# Patient Record
Sex: Female | Born: 1945 | Race: Black or African American | Hispanic: No | Smoking: Former smoker
Health system: Southern US, Community
[De-identification: ages and names within clinical notes are randomized; demographics above are authoritative.]

## PROBLEM LIST (undated history)

## (undated) DIAGNOSIS — C3490 Malignant neoplasm of unspecified part of unspecified bronchus or lung: Secondary | ICD-10-CM

## (undated) DIAGNOSIS — E785 Hyperlipidemia, unspecified: Secondary | ICD-10-CM

## (undated) DIAGNOSIS — K219 Gastro-esophageal reflux disease without esophagitis: Secondary | ICD-10-CM

## (undated) DIAGNOSIS — IMO0001 Reserved for inherently not codable concepts without codable children: Secondary | ICD-10-CM

## (undated) DIAGNOSIS — I10 Essential (primary) hypertension: Secondary | ICD-10-CM

## (undated) HISTORY — DX: Gastro-esophageal reflux disease without esophagitis: K21.9

## (undated) HISTORY — DX: Malignant neoplasm of unspecified part of unspecified bronchus or lung: C34.90

## (undated) HISTORY — DX: Essential (primary) hypertension: I10

## (undated) HISTORY — DX: Reserved for inherently not codable concepts without codable children: IMO0001

---

## 1998-07-15 ENCOUNTER — Ambulatory Visit (HOSPITAL_COMMUNITY): Admission: RE | Admit: 1998-07-15 | Discharge: 1998-07-15 | Payer: Self-pay | Admitting: Family Medicine

## 1999-01-07 ENCOUNTER — Ambulatory Visit (HOSPITAL_COMMUNITY): Admission: RE | Admit: 1999-01-07 | Discharge: 1999-01-07 | Payer: Self-pay | Admitting: *Deleted

## 1999-01-07 ENCOUNTER — Encounter: Payer: Self-pay | Admitting: Family Medicine

## 1999-02-08 ENCOUNTER — Other Ambulatory Visit: Admission: RE | Admit: 1999-02-08 | Discharge: 1999-02-08 | Payer: Self-pay | Admitting: Family Medicine

## 1999-10-18 ENCOUNTER — Ambulatory Visit (HOSPITAL_COMMUNITY): Admission: RE | Admit: 1999-10-18 | Discharge: 1999-10-18 | Payer: Self-pay | Admitting: General Surgery

## 1999-10-20 ENCOUNTER — Encounter (INDEPENDENT_AMBULATORY_CARE_PROVIDER_SITE_OTHER): Payer: Self-pay | Admitting: *Deleted

## 1999-10-20 ENCOUNTER — Ambulatory Visit (HOSPITAL_COMMUNITY): Admission: RE | Admit: 1999-10-20 | Discharge: 1999-10-20 | Payer: Self-pay | Admitting: *Deleted

## 2000-01-10 ENCOUNTER — Encounter: Payer: Self-pay | Admitting: Family Medicine

## 2000-01-10 ENCOUNTER — Ambulatory Visit (HOSPITAL_COMMUNITY): Admission: RE | Admit: 2000-01-10 | Discharge: 2000-01-10 | Payer: Self-pay | Admitting: Family Medicine

## 2000-06-11 ENCOUNTER — Encounter: Admission: RE | Admit: 2000-06-11 | Discharge: 2000-06-11 | Payer: Self-pay | Admitting: Family Medicine

## 2000-06-11 ENCOUNTER — Encounter: Payer: Self-pay | Admitting: Family Medicine

## 2000-08-22 ENCOUNTER — Other Ambulatory Visit: Admission: RE | Admit: 2000-08-22 | Discharge: 2000-08-22 | Payer: Self-pay | Admitting: Family Medicine

## 2002-06-05 ENCOUNTER — Encounter: Payer: Self-pay | Admitting: Family Medicine

## 2002-06-05 ENCOUNTER — Encounter: Admission: RE | Admit: 2002-06-05 | Discharge: 2002-06-05 | Payer: Self-pay | Admitting: Family Medicine

## 2003-06-01 ENCOUNTER — Encounter (INDEPENDENT_AMBULATORY_CARE_PROVIDER_SITE_OTHER): Payer: Self-pay | Admitting: Specialist

## 2003-06-01 ENCOUNTER — Ambulatory Visit (HOSPITAL_BASED_OUTPATIENT_CLINIC_OR_DEPARTMENT_OTHER): Admission: RE | Admit: 2003-06-01 | Discharge: 2003-06-01 | Payer: Self-pay | Admitting: General Surgery

## 2003-06-01 ENCOUNTER — Ambulatory Visit (HOSPITAL_COMMUNITY): Admission: RE | Admit: 2003-06-01 | Discharge: 2003-06-01 | Payer: Self-pay | Admitting: General Surgery

## 2003-06-22 ENCOUNTER — Emergency Department (HOSPITAL_COMMUNITY): Admission: EM | Admit: 2003-06-22 | Discharge: 2003-06-22 | Payer: Self-pay | Admitting: Emergency Medicine

## 2003-08-23 ENCOUNTER — Emergency Department (HOSPITAL_COMMUNITY): Admission: EM | Admit: 2003-08-23 | Discharge: 2003-08-23 | Payer: Self-pay | Admitting: Emergency Medicine

## 2004-10-08 IMAGING — CR DG ANKLE COMPLETE 3+V*L*
2 series · 2 of 2 positions shown · non-contrast
Comparison: none

CLINICAL DATA: Patient has left ankle pain.
 LEFT ANKLE, THREE VIEWS
 No evidence of fracture.  
 IMPRESSION 
 No fracture.

[view not recorded (1 of 2)]
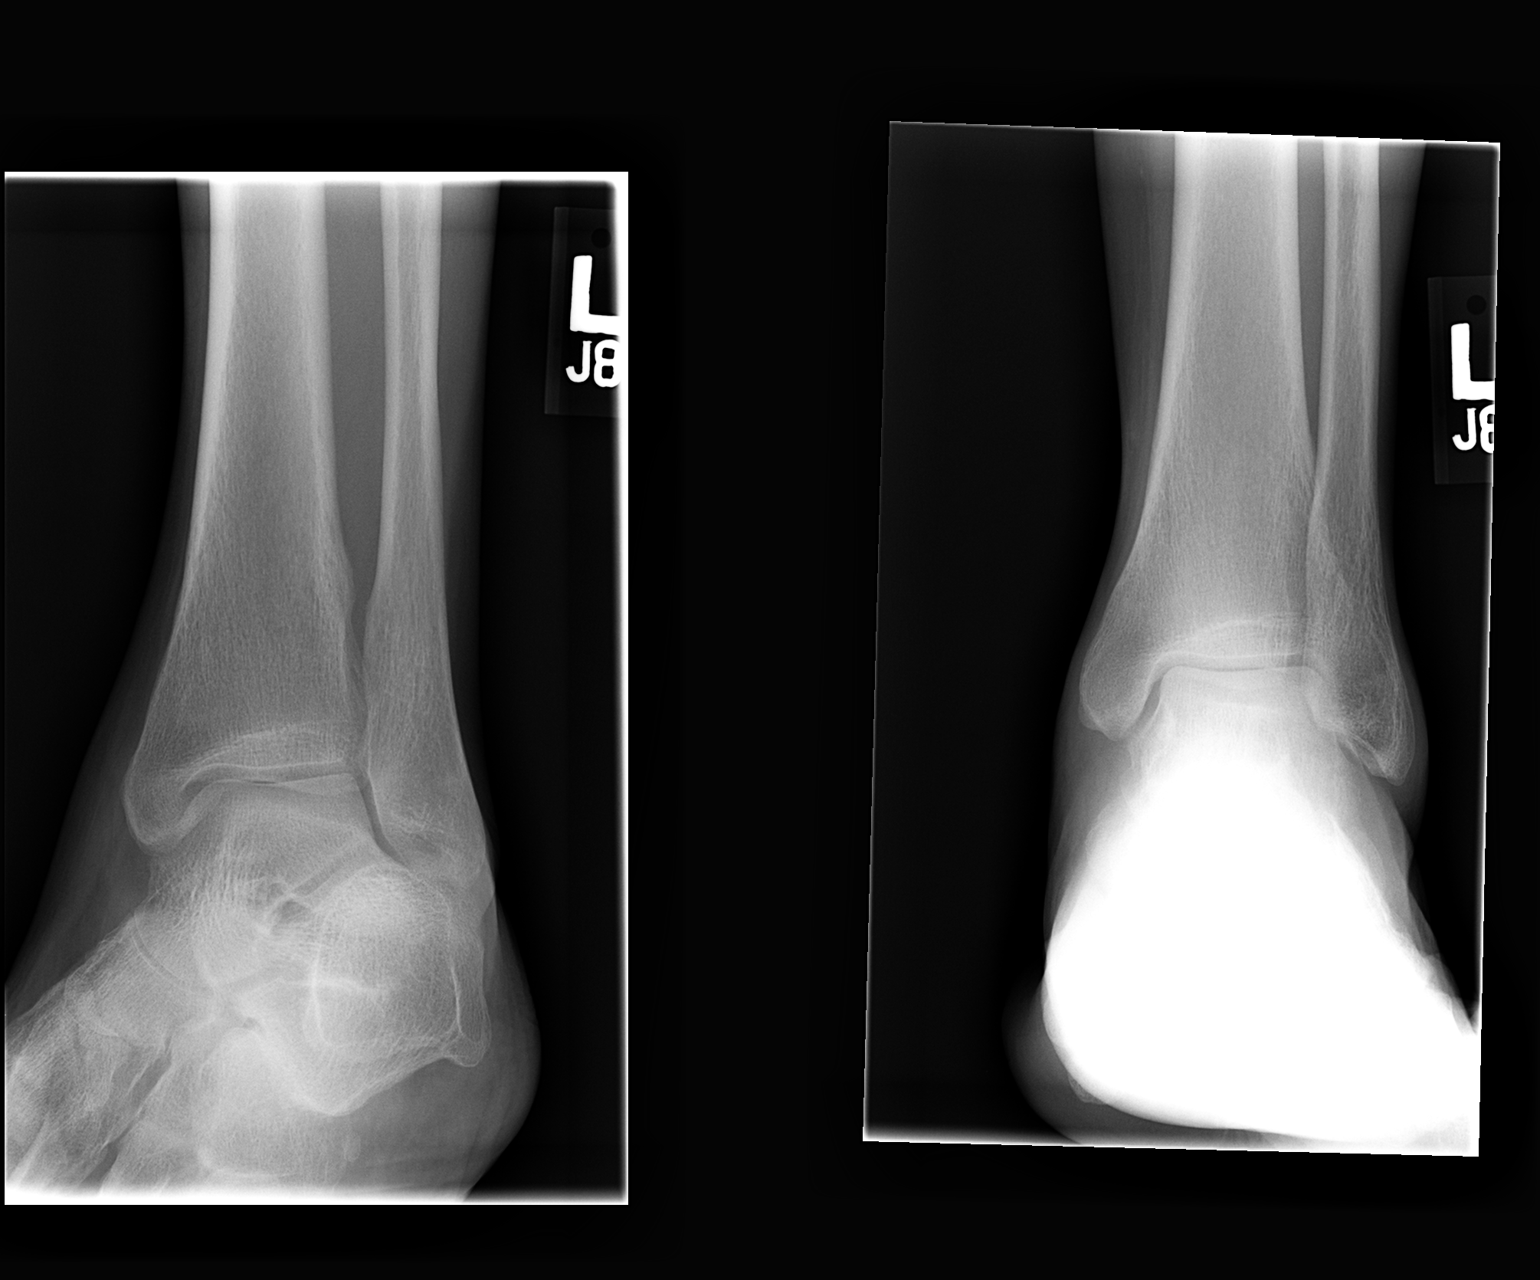

[view not recorded (2 of 2)]
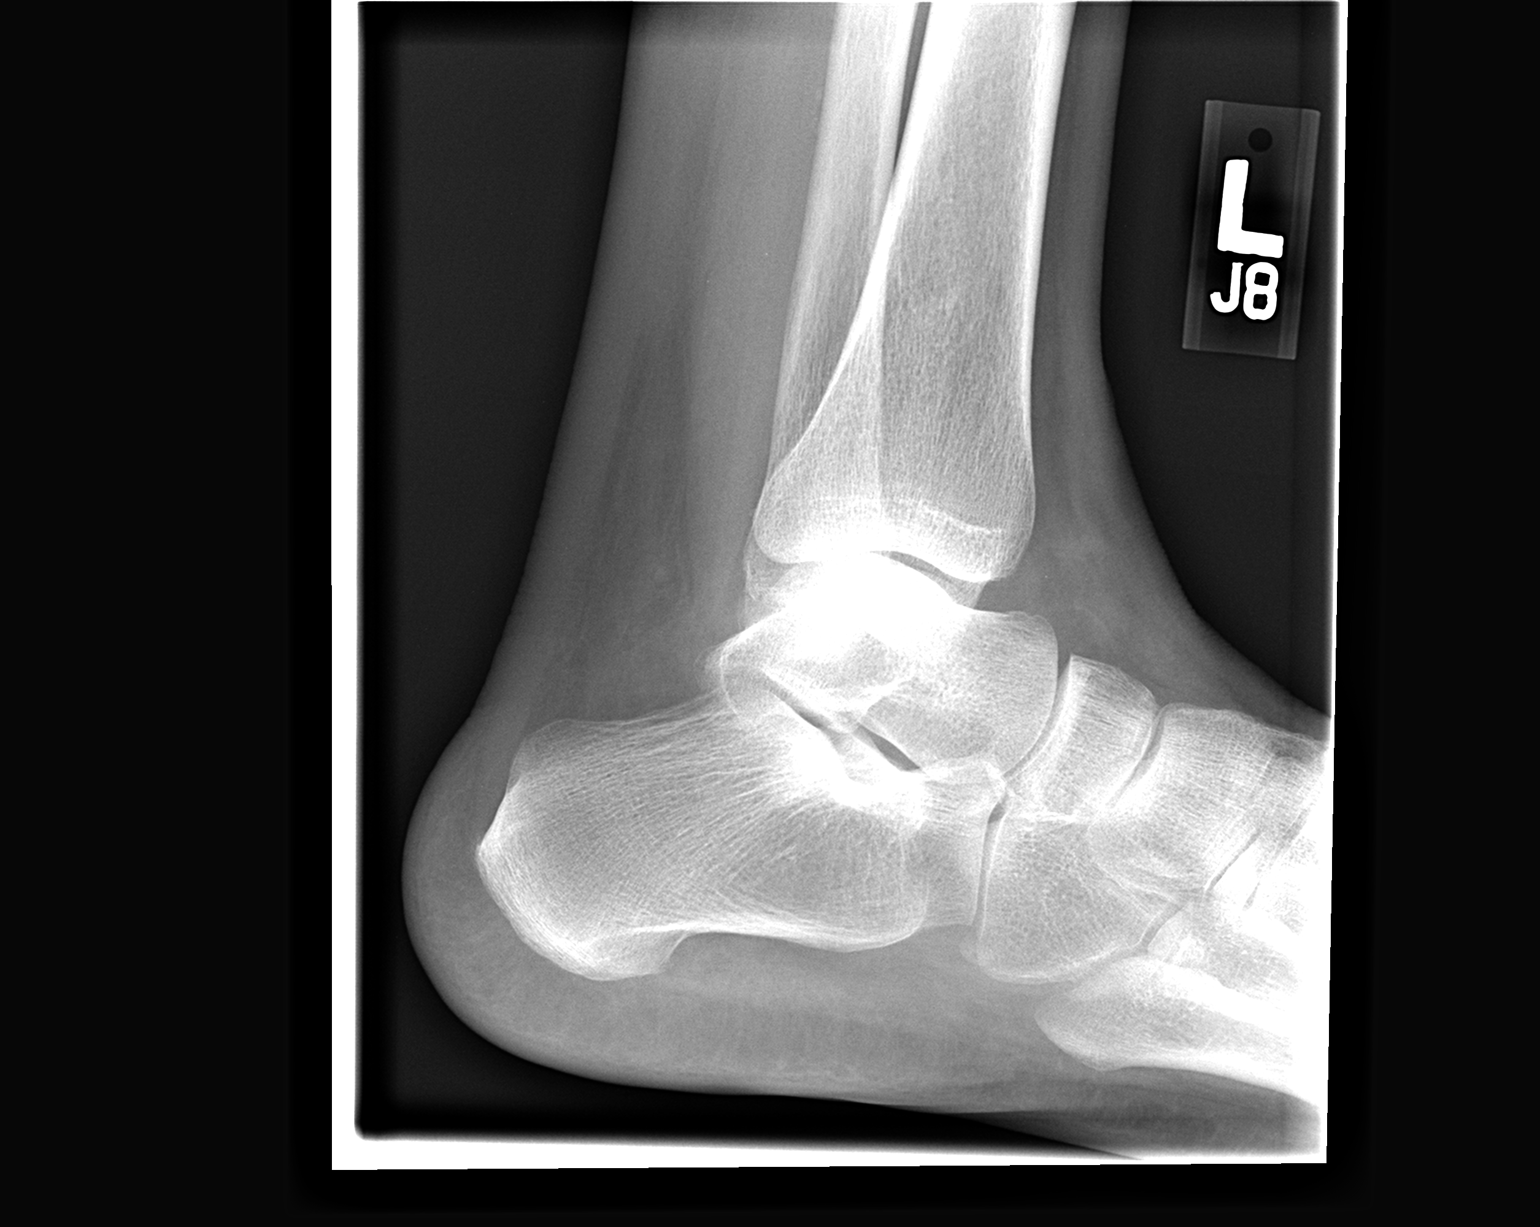

[2 of 2 positions shown; findings below may reference images not displayed]

## 2006-02-22 ENCOUNTER — Emergency Department (HOSPITAL_COMMUNITY): Admission: EM | Admit: 2006-02-22 | Discharge: 2006-02-22 | Payer: Self-pay | Admitting: Emergency Medicine

## 2006-03-23 ENCOUNTER — Ambulatory Visit (HOSPITAL_COMMUNITY): Admission: RE | Admit: 2006-03-23 | Discharge: 2006-03-23 | Payer: Self-pay | Admitting: Gastroenterology

## 2006-03-23 ENCOUNTER — Encounter (INDEPENDENT_AMBULATORY_CARE_PROVIDER_SITE_OTHER): Payer: Self-pay | Admitting: Specialist

## 2006-05-04 ENCOUNTER — Encounter: Admission: RE | Admit: 2006-05-04 | Discharge: 2006-05-04 | Payer: Self-pay | Admitting: Gastroenterology

## 2006-05-21 ENCOUNTER — Ambulatory Visit (HOSPITAL_COMMUNITY): Admission: RE | Admit: 2006-05-21 | Discharge: 2006-05-21 | Payer: Self-pay | Admitting: Pulmonary Disease

## 2006-06-06 ENCOUNTER — Ambulatory Visit: Payer: Self-pay | Admitting: Thoracic Surgery

## 2006-06-11 ENCOUNTER — Encounter: Admission: RE | Admit: 2006-06-11 | Discharge: 2006-06-11 | Payer: Self-pay | Admitting: Obstetrics

## 2006-06-19 ENCOUNTER — Ambulatory Visit: Admission: RE | Admit: 2006-06-19 | Discharge: 2006-06-19 | Payer: Self-pay | Admitting: Gynecology

## 2006-06-21 ENCOUNTER — Inpatient Hospital Stay (HOSPITAL_COMMUNITY): Admission: RE | Admit: 2006-06-21 | Discharge: 2006-06-30 | Payer: Self-pay | Admitting: Thoracic Surgery

## 2006-06-21 ENCOUNTER — Encounter (INDEPENDENT_AMBULATORY_CARE_PROVIDER_SITE_OTHER): Payer: Self-pay | Admitting: Specialist

## 2006-06-21 ENCOUNTER — Ambulatory Visit: Payer: Self-pay | Admitting: Thoracic Surgery

## 2006-06-21 HISTORY — PX: OTHER SURGICAL HISTORY: SHX169

## 2006-07-03 ENCOUNTER — Ambulatory Visit: Payer: Self-pay | Admitting: Thoracic Surgery

## 2006-07-11 ENCOUNTER — Ambulatory Visit: Payer: Self-pay | Admitting: Thoracic Surgery

## 2006-07-11 ENCOUNTER — Encounter: Admission: RE | Admit: 2006-07-11 | Discharge: 2006-07-11 | Payer: Self-pay | Admitting: Thoracic Surgery

## 2006-07-13 ENCOUNTER — Ambulatory Visit: Admission: RE | Admit: 2006-07-13 | Discharge: 2006-07-13 | Payer: Self-pay | Admitting: Gynecology

## 2006-07-20 ENCOUNTER — Ambulatory Visit: Payer: Self-pay | Admitting: Thoracic Surgery

## 2006-08-08 ENCOUNTER — Ambulatory Visit: Payer: Self-pay | Admitting: Thoracic Surgery

## 2006-08-08 ENCOUNTER — Encounter: Admission: RE | Admit: 2006-08-08 | Discharge: 2006-08-08 | Payer: Self-pay | Admitting: Thoracic Surgery

## 2006-08-09 ENCOUNTER — Ambulatory Visit: Payer: Self-pay | Admitting: Thoracic Surgery

## 2006-09-14 ENCOUNTER — Ambulatory Visit: Admission: RE | Admit: 2006-09-14 | Discharge: 2006-09-14 | Payer: Self-pay | Admitting: Gynecology

## 2006-09-19 ENCOUNTER — Ambulatory Visit: Payer: Self-pay | Admitting: Thoracic Surgery

## 2006-09-19 ENCOUNTER — Encounter: Admission: RE | Admit: 2006-09-19 | Discharge: 2006-09-19 | Payer: Self-pay | Admitting: Thoracic Surgery

## 2006-09-25 ENCOUNTER — Inpatient Hospital Stay (HOSPITAL_COMMUNITY): Admission: RE | Admit: 2006-09-25 | Discharge: 2006-09-29 | Payer: Self-pay | Admitting: Obstetrics & Gynecology

## 2006-09-25 ENCOUNTER — Encounter: Payer: Self-pay | Admitting: Gynecology

## 2006-10-11 ENCOUNTER — Ambulatory Visit: Payer: Self-pay | Admitting: Thoracic Surgery

## 2006-11-09 ENCOUNTER — Ambulatory Visit: Admission: RE | Admit: 2006-11-09 | Discharge: 2006-11-09 | Payer: Self-pay | Admitting: Gynecology

## 2006-11-20 ENCOUNTER — Encounter: Admission: RE | Admit: 2006-11-20 | Discharge: 2006-11-20 | Payer: Self-pay | Admitting: Thoracic Surgery

## 2006-11-20 ENCOUNTER — Ambulatory Visit: Payer: Self-pay | Admitting: Thoracic Surgery

## 2007-02-19 ENCOUNTER — Encounter: Admission: RE | Admit: 2007-02-19 | Discharge: 2007-02-19 | Payer: Self-pay | Admitting: Thoracic Surgery

## 2007-02-19 ENCOUNTER — Ambulatory Visit: Payer: Self-pay | Admitting: Thoracic Surgery

## 2007-06-11 ENCOUNTER — Encounter: Admission: RE | Admit: 2007-06-11 | Discharge: 2007-06-11 | Payer: Self-pay | Admitting: Thoracic Surgery

## 2007-06-12 ENCOUNTER — Ambulatory Visit: Payer: Self-pay | Admitting: Thoracic Surgery

## 2007-08-21 IMAGING — CT CT CHEST W/ CM
4 of 5 series · 11 of 46 positions shown, 17 images · IV contrast (READICAT/WATER & [ID] OMNI 300)
Comparison: none

CLINICAL DATA: 60-year-old female, abnormal weight loss, cough, upper respiratory infection. 
CHEST CT WITH CONTRAST:
TECHNIQUE: Multidetector CT imaging of the chest was performed following the standard protocol during bolus administration of intravenous contrast.
Contrast:  100 cc Omnipaque 300
TECHNIQUE: Multidetector CT imaging of the abdomen was performed following the standard protocol during bolus administration of intravenous contrast.

[Series 3: chest/abd/pelvis · axial · 0.70mm/px · z∈[-431,-231]mm · 4 of 100 slices shown]
[im 7/100  soft-tissue]
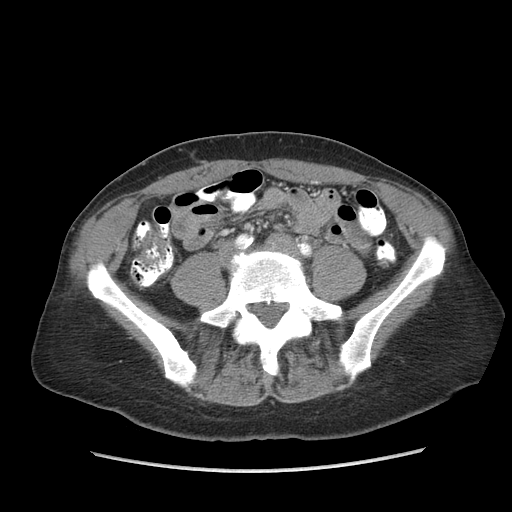
[im 20/100  soft-tissue]
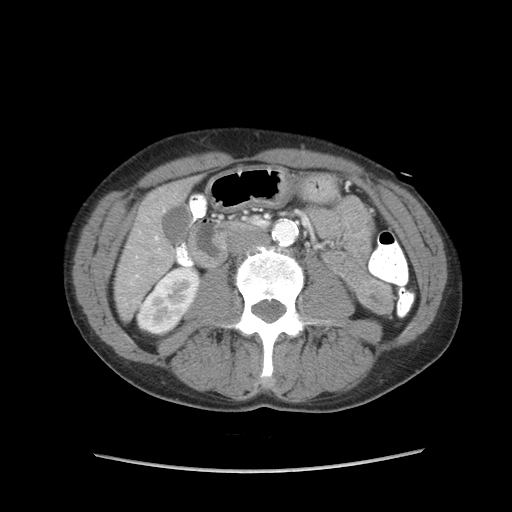
[im 34/100  soft-tissue]
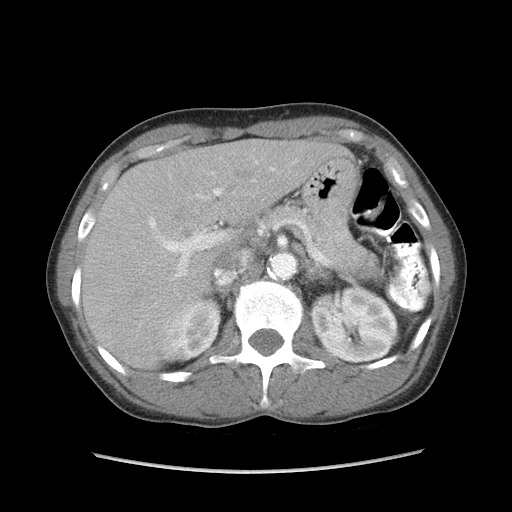
[im 47/100  soft-tissue]
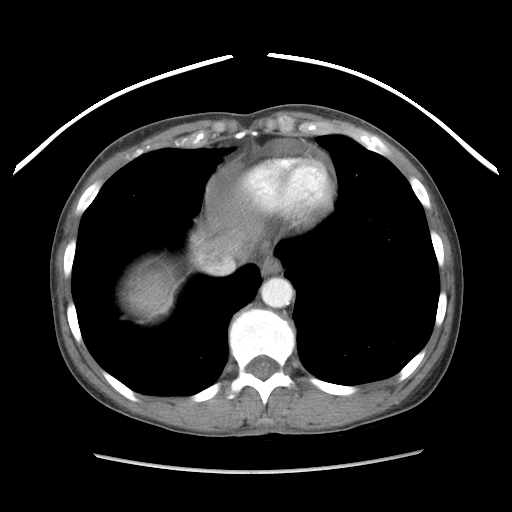

[Series 5: renal delay · axial · delayed · 0.70mm/px · z∈[-361,-291]mm · 3 of 30 slices shown, 7 images]
[im 8/30  soft-tissue]
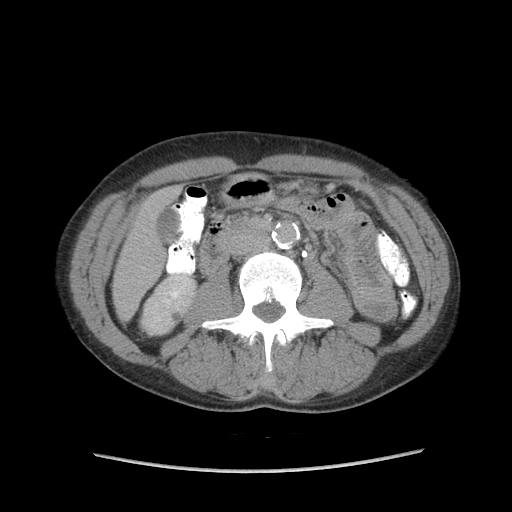
[im 8/30  lung]
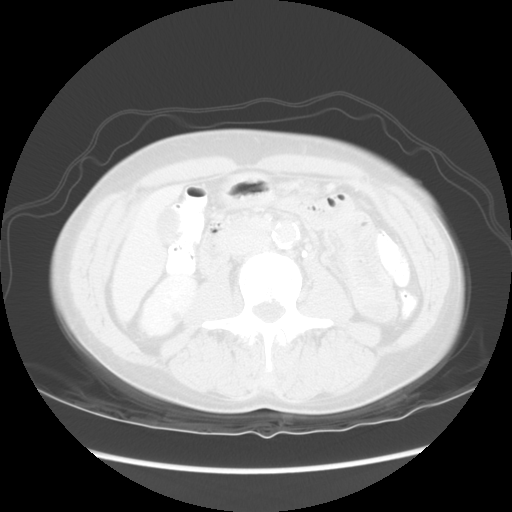
[im 8/30  bone]
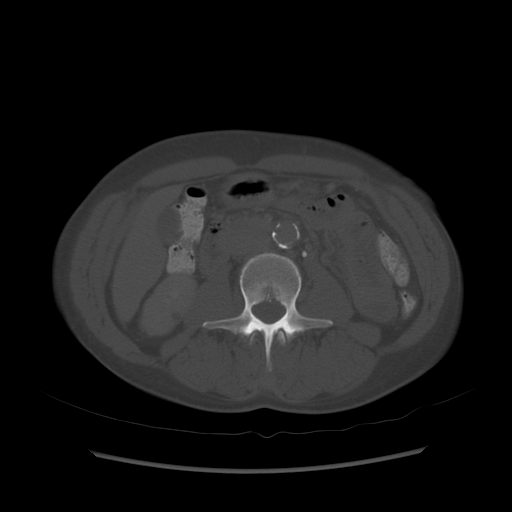
[im 15/30  soft-tissue]
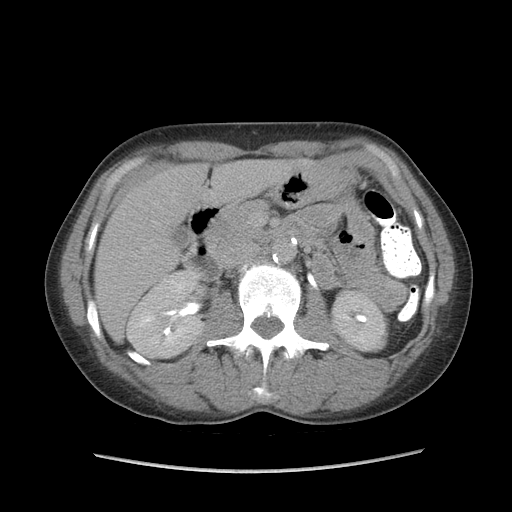
[im 15/30  lung]
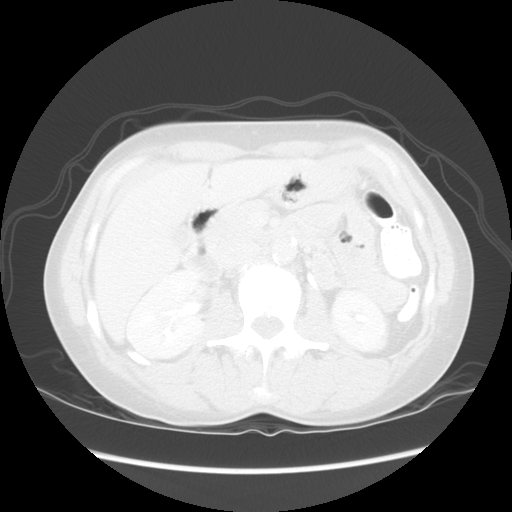
[im 22/30  soft-tissue]
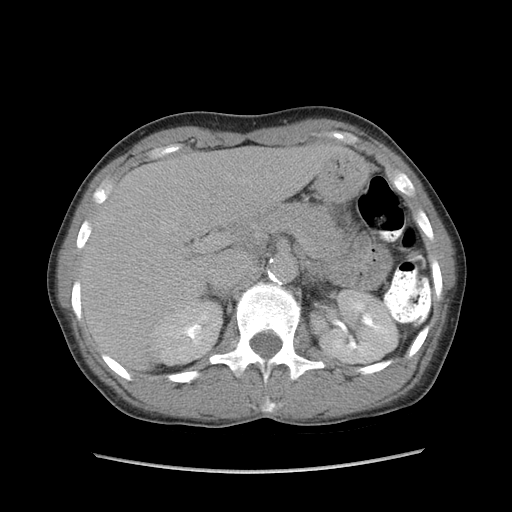
[im 22/30  lung]
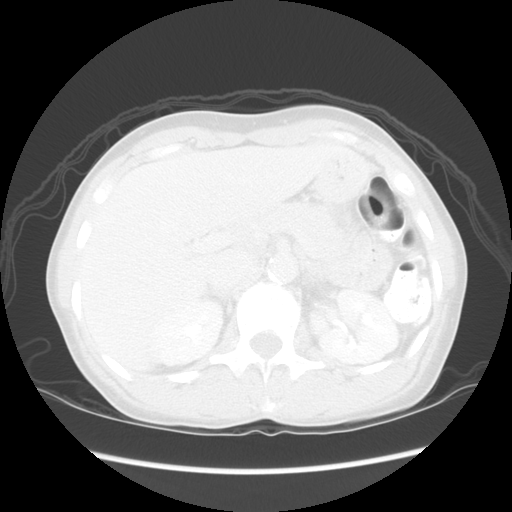

[Series 601: coronal body · coronal · 0.97mm/px · 1 of 123 slices shown, 2 images]
[im 41/123  soft-tissue]
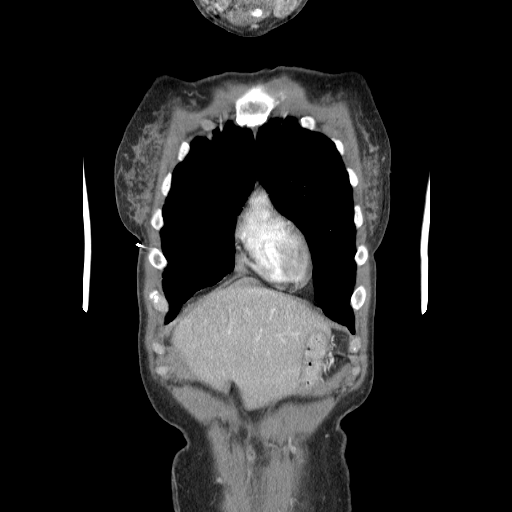
[im 41/123  bone]
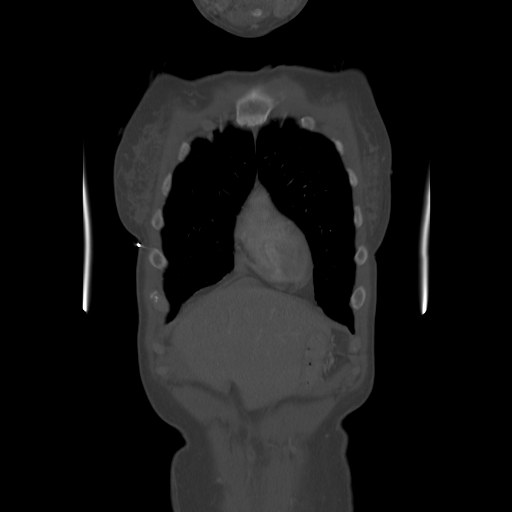

[Series 602: sagittal body · sagittal · 0.97mm/px · 3 of 144 slices shown, 4 images]
[im 48/144  soft-tissue]
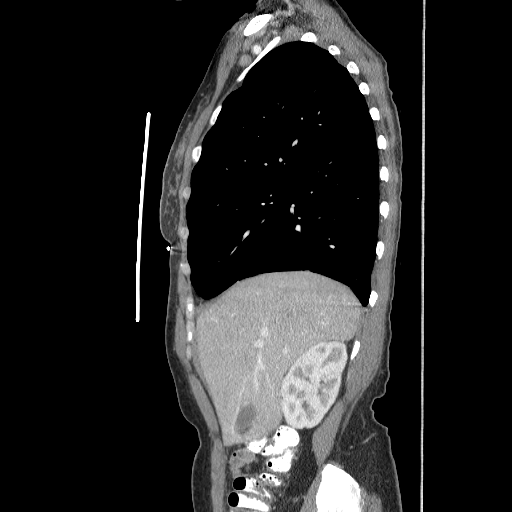
[im 64/144  soft-tissue]
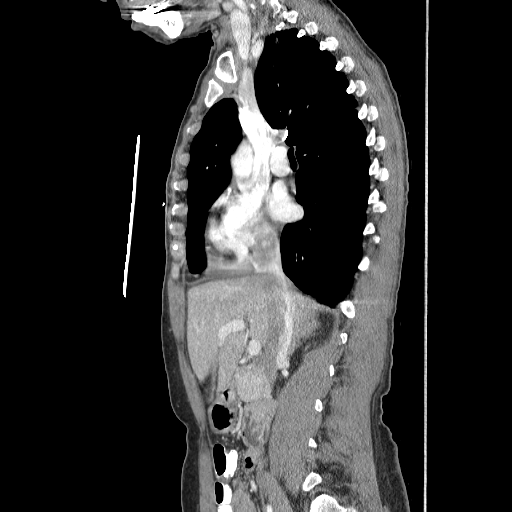
[im 64/144  bone]
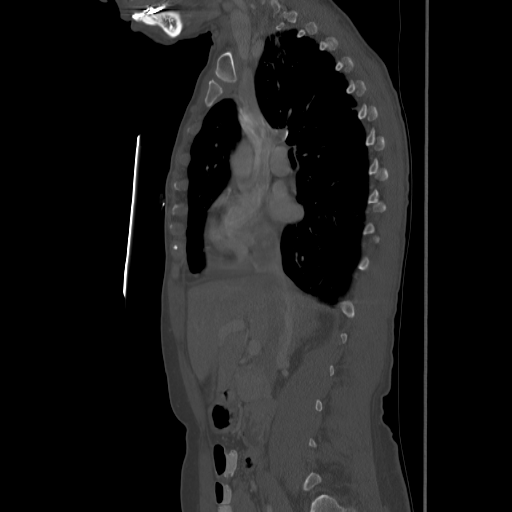
[im 80/144  soft-tissue]
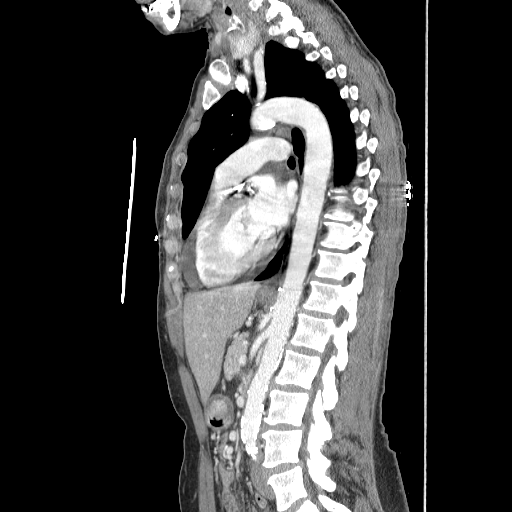

[11 of 46 positions shown; findings below may reference images not displayed]

FINDINGS: In the right lower thyroid gland, there is a 10 mm hypodense nodule.  Recommend followup thyroid ultrasound.  Small nonenlarged axillary lymph nodes. 
No mediastinal or hilar adenopathy.  Atherosclerotic calcification is present of the aorta without aneurysm.  Central pulmonary arteries are patient.  Normal heart size.  There is a small anterior pericardial effusion.  No pleural effusion. 
Lung windows demonstrate a spiculated 12 x 14 mm nodule in the anterior right upper lobe, image 11.  The lesion is concerning for a primary bronchogenic carcinoma.  Lungs are hyperinflated.  Background emphysema is noted.  Minimal right lateral subpleural atelectasis versus scarring.  No consolidation, pneumonia, acute infiltrate, interstitial process, or a central airway abnormality.
IMPRESSION: 1.  Anterior right upper lobe spiculated 12 x 14 mm nodule suspicious for a primary bronchogenic malignancy.  
2.  Emphysema and hyperinflation. 
3.  Right lower lobe atelectasis.
4.  Small anterior pericardial effusion. 
5.  No definite mediastinal or hilar adenopathy by CT criteria. 
6.  Right lower thyroid hypodense nodule, recommend ultrasound correlation. 
ABDOMEN CT WITH CONTRAST:
FINDINGS: The liver demonstrates a 12 mm enhancing focus in the right hepatic lobe laterally, image 63.  There is a second hepatic lesion more inferiorly in the right hepatic lobe posterior segment with nodular enhancement roughly measuring 17 x 15 mm.  These lesions are indeterminate by single phase CT imaging but suspicious for hemangiomas.  Metastatic disease is not entirely excluded.  Further evaluation is recommended with abdominal MRI with contrast.  Focal fatty infiltration is also noted of the liver along the falciform ligament involving the left hepatic lobe medial segment.  Portal vein is patent.  Hepatic veins are not opacified because of the phase of imaging.  The kidneys are normal except for a lower pole hypodense cyst measuring 7 mm on the right.  No hydronephrosis or obstruction.  Adrenal glands, spleen, and pancreas are normal.  Atherosclerotic calcifications are present of the aorta.  no evidence of aneurysm.  Accessory spleen is noted in the left upper quadrant.  No abdominal adenopathy, free fluid, ascites, bowel obstruction, or additional acute finding.  Mild degenerative changes of the thoracolumbar spine.
IMPRESSION: 1.  Vascular enhancing right hepatic lesions (2) the largest measuring 17 x 15 mm inferiorly.  These are indeterminate and may represent hemangiomas, less likely metastasis.  Recommend followup MRI of the abdomen with contrast. 
2.  Focal fatty infiltration of the left hepatic lobe along the falciform ligament. 
3.  Small subcentimeter right inferior pole renal cyst. 
4.  Aortic atherosclerosis without aneurysm. 
5.  No abdominal adenopathy.

## 2007-09-07 IMAGING — PT NM PET TUM IMG SKULL BASE T - THIGH
6 series · 25 of 25 positions shown · non-contrast
Comparison: CT scan 05/04/06.

CLINICAL DATA: Right upper lobe pulmonary nodule.  Liver nodules.
FDG PET-CT TUMOR IMAGING (SKULL BASE TO THIGHS):
TECHNIQUE: 6.3 mCi F-18 FDG.

[Series 1: pet ac · axial · 3.3mm · 4.69mm/px · z∈[-875,-5]mm · 5 of 267 slices shown]
[im 1/267]
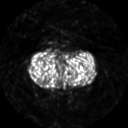
[im 67/267]
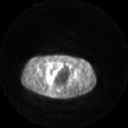
[im 134/267]
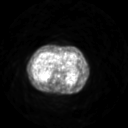
[im 200/267]
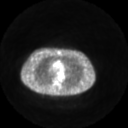
[im 267/267]
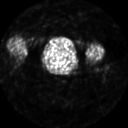

[Series 2: pet nac · axial · 3.3mm · 4.69mm/px · z∈[-875,-5]mm · 6 of 267 slices shown]
[im 1/267]
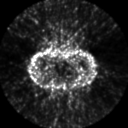
[im 54/267]
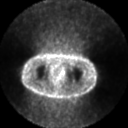
[im 107/267]
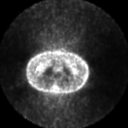
[im 160/267]
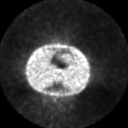
[im 213/267]
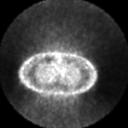
[im 267/267]
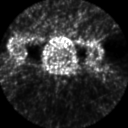

[Series 2: ct images · axial · 3.8mm · 0.98mm/px · z∈[-875,-5]mm · 6 of 267 slices shown]
[im 1/267]
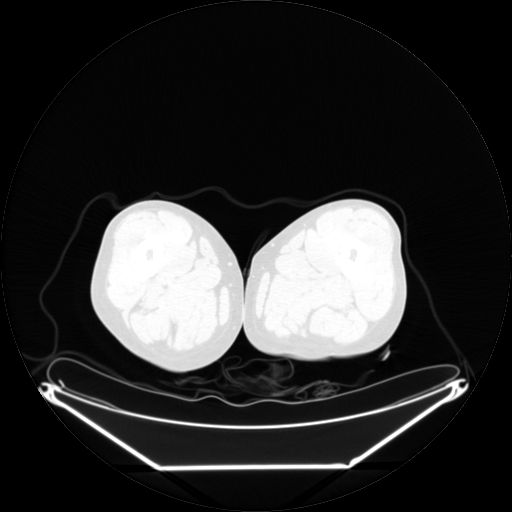
[im 54/267]
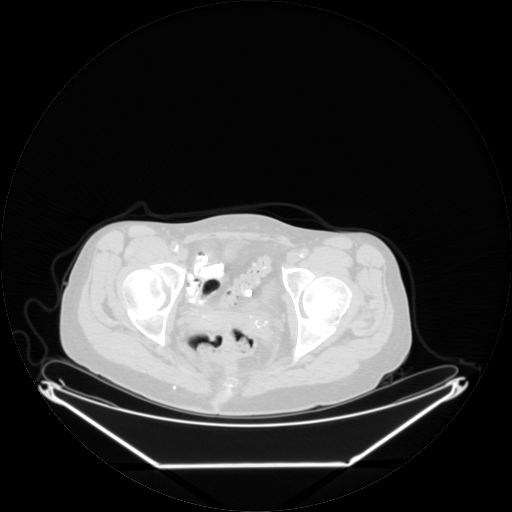
[im 107/267]
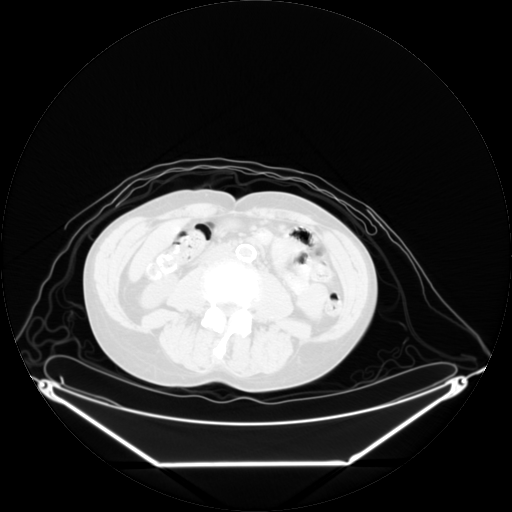
[im 160/267]
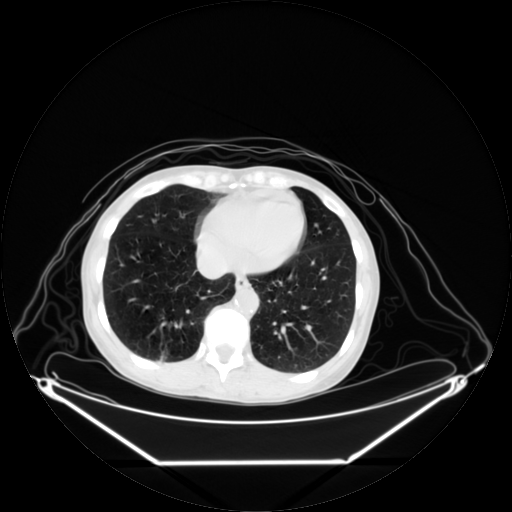
[im 213/267]
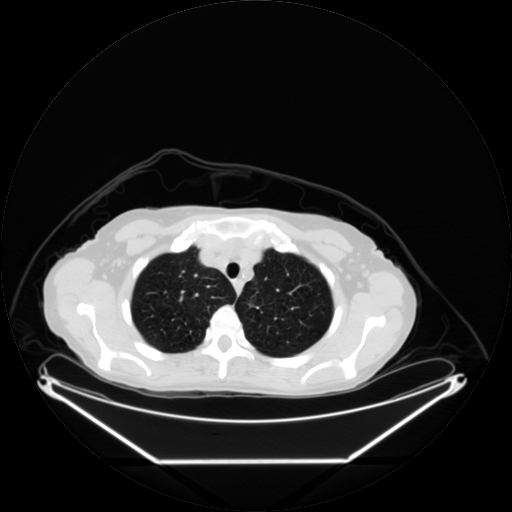
[im 267/267  bone]
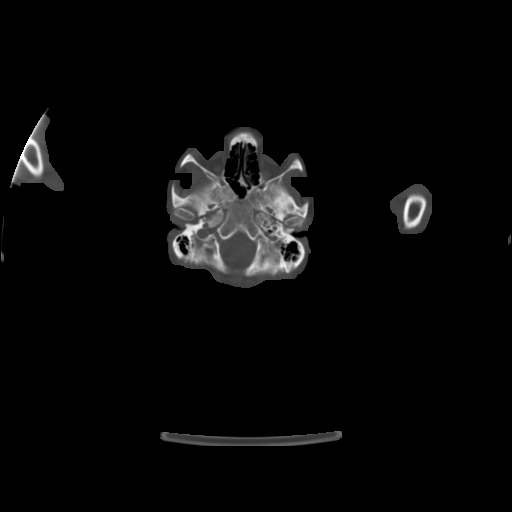

[Series 123: mip · coronal · 3.3mm · 4.69mm/px · 1 of 30 slices shown]
[im 1/30]
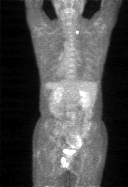

[Series 151: reformatted · axial · 3.3mm · 3.91mm/px · z∈[-875,-5]mm · 6 of 265 slices shown (1 of 2)]
[im 1/265]
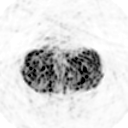
[im 53/265]
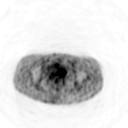
[im 106/265]
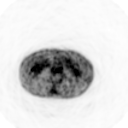
[im 159/265]
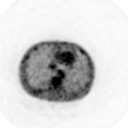
[im 212/265]
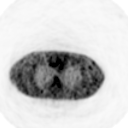
[im 265/265]
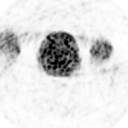

[Series 153: reformatted · coronal · 4.7mm · 6.98mm/px · 1 of 66 slices shown (2 of 2)]
[im 1/66]
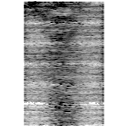

[25 of 25 positions shown; findings below may reference images not displayed]

FINDINGS: The right upper lobe nodule has high FDG activity, with maximum standard uptake value of 4.2, compatible with malignancy or less likely active granulomatous process.  No significant abnormal activity is identified in the neck.  No definite abnormal mediastinal or hilar nodal activity.  
A faint nodular density medially in the left upper lobe on image 56 of series 2 does not have increased FDG activity, but measures only 5 mm in size. This has indistinct margins.  This will require observation to exclude the possibility of left upper lobe malignancy.
There is atherosclerotic calcification in the aortic arch.  A moderate pericardial effusion is of uncertain etiology.  Linear subsegmental atelectasis or scarring is present in the posterior basal segment of the right lower lobe. There is atherosclerosis of the abdominal aorta and common iliac arteries.
There is a large septated cystic mass in the left pelvis suspicious for an ovarian mass.  This measures 10.1 x 6.6 cm.  Although we do not demonstrate definite associated increased FDG activity, this lesion is clearly septated with some thick margins, and merits resection.
There is abnormal density and FDG activity in the left ischial rectal fossa and tracking down in the left perineum. Maximum standard uptake value in this region is 4.7.  This could represent a granulomatous inflammation, or malignancy in this region. This tracts into the subcutaneous tissues of the left perineum. 
No abnormal increased hepatic activity is identified.
IMPRESSION: 1.  The right upper lobe pulmonary nodule has very high activity, suspicious for malignancy.  A left upper lobe small, faint region of spiculation is nonspecific and does not have increased FDG activity, but is too small to be readily detectable by PET CT, and merits attention on follow-up studies.
2.  Cystic left adnexal/ovarian mass.  Although this does not have obvious increased FDG activity, it merits resection if possible.
3.  Abnormal activity in the left ischial rectal fossa and tracking down into the left perineum.  Although this has an appearance suggesting infiltration of the fat and inflammation, malignancy cannot be readily excluded, and biopsy may be warranted. 
These results were discussed with Dr. Moutran at 8454 hours on 05/21/06.

## 2007-09-28 IMAGING — US US PELVIS COMPLETE MODIFY
1 series · 14 of 25 positions shown · non-contrast
Comparison: The PET scan from 05/21/2006 has been reviewed.

TRANSABDOMINAL AND TRANSVAGINAL PELVIC ULTRASOUND:

CLINICAL DATA: Ovarian mass seen on a recent PET scan
TECHNIQUE: Both transabdominal and transvaginal ultrasound examinations of the
pelvis were performed including evaluation of the uterus, ovaries, adnexal
regions, and pelvic cul-de-sac.

[Series 1: unknown · 0.33mm/px · 14 of 64 slices shown]
[im 1/64]
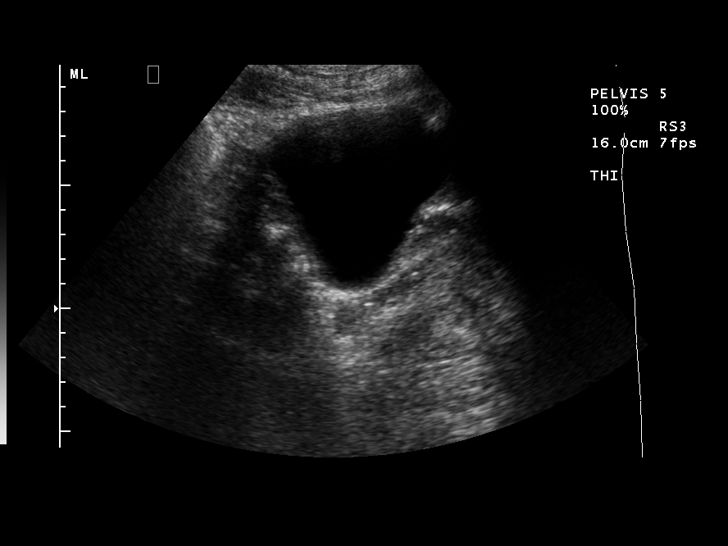
[im 6/64]
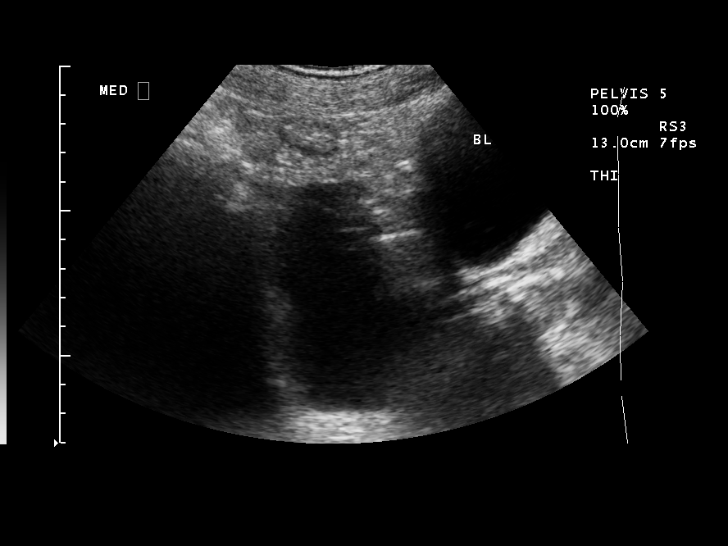
[im 11/64]
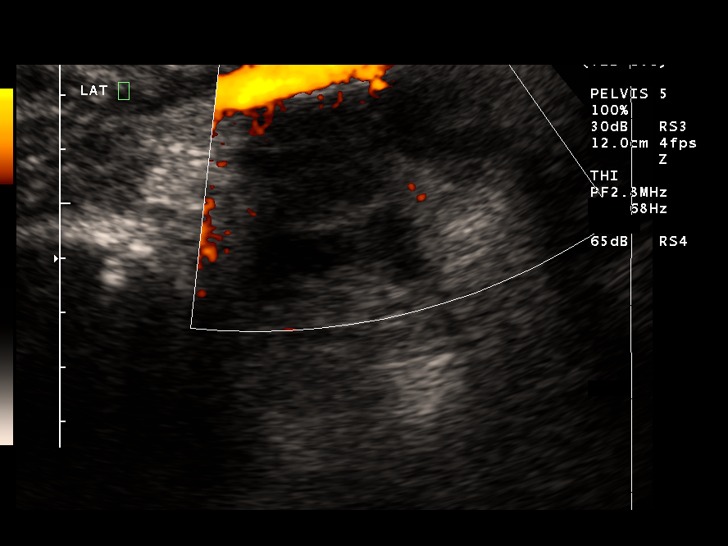
[im 16/64]
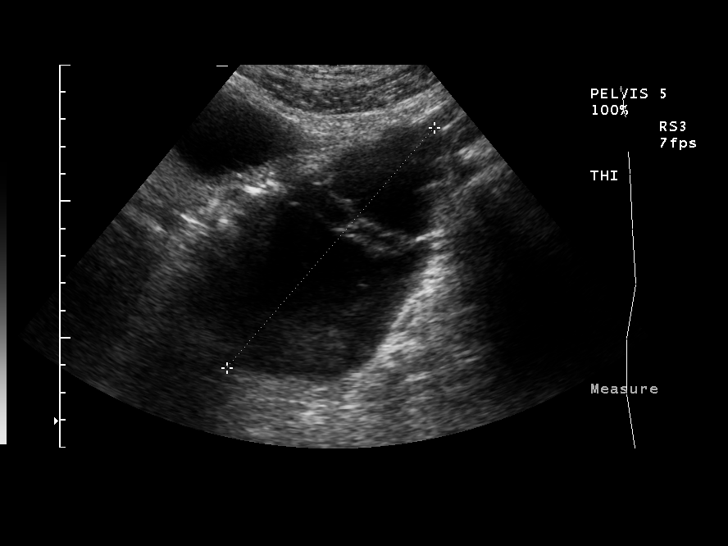
[im 22/64]
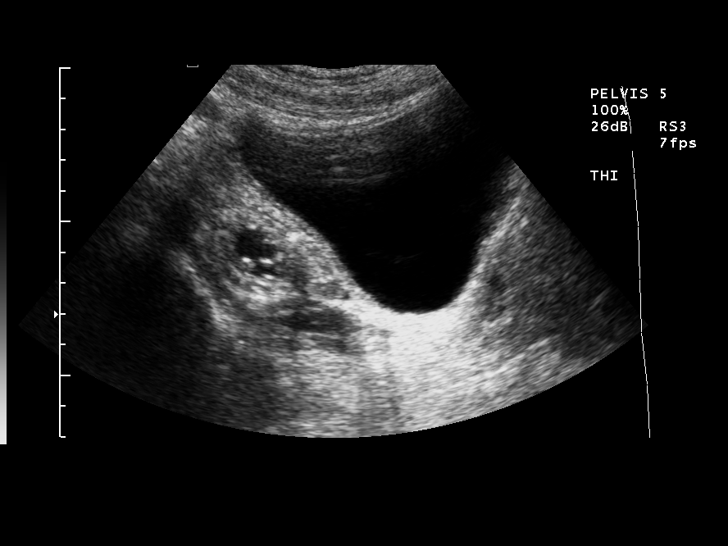
[im 24/64]
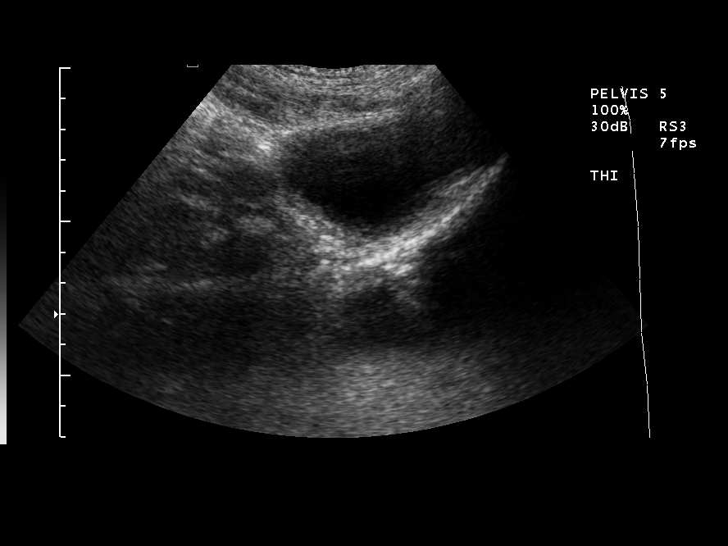
[im 29/64]
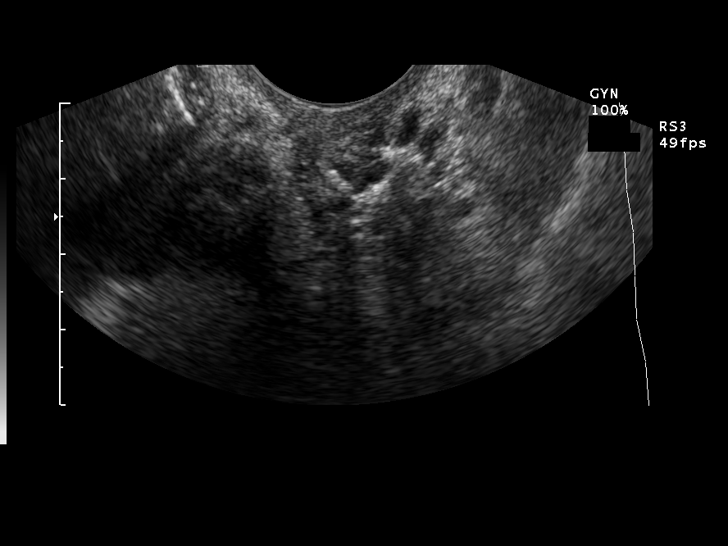
[im 35/64]
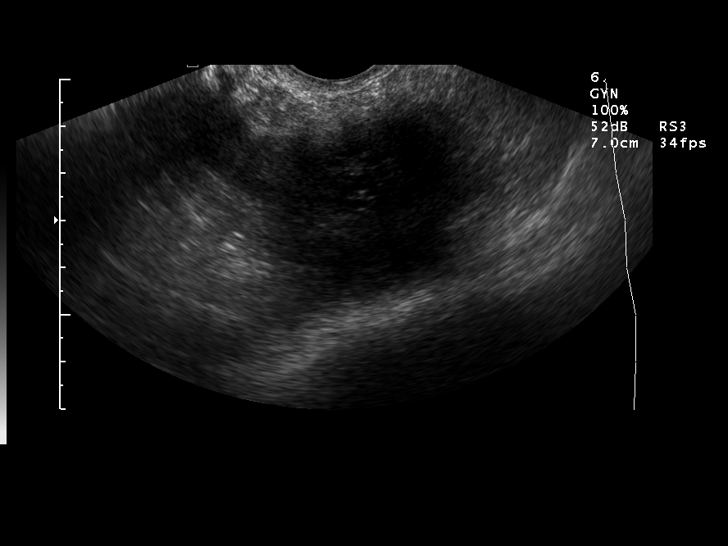
[im 40/64]
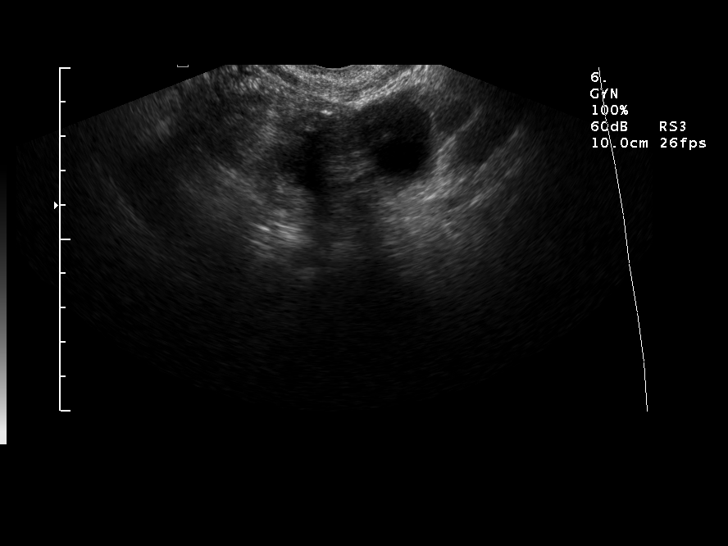
[im 43/64]
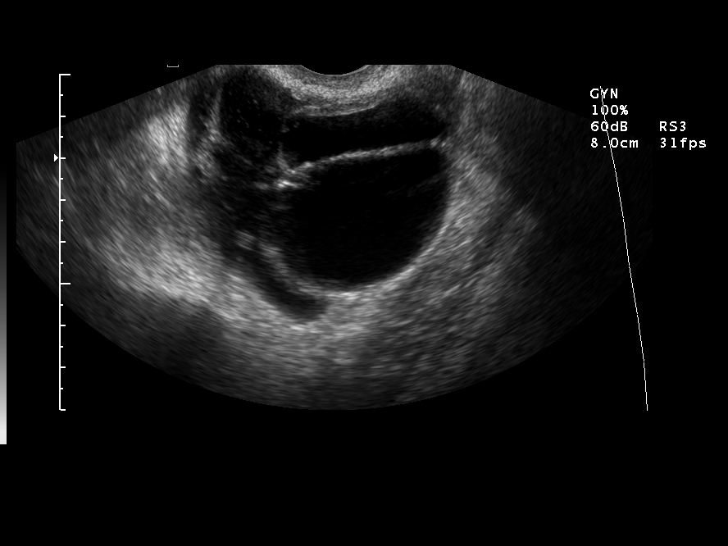
[im 48/64]
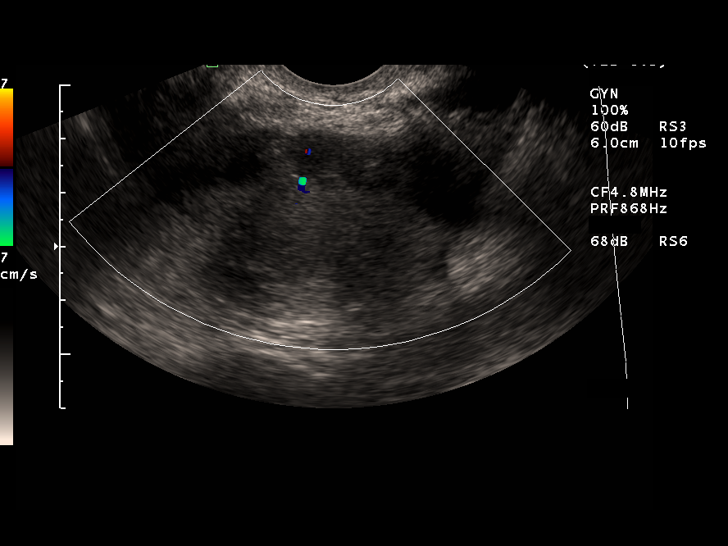
[im 53/64]
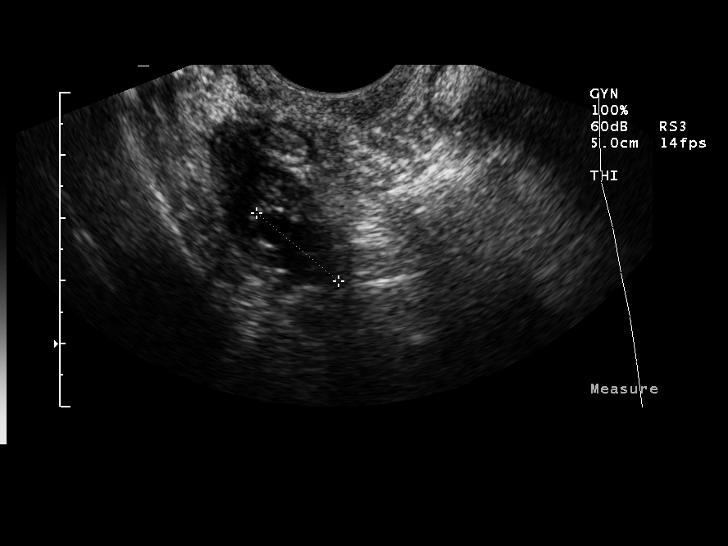
[im 58/64]
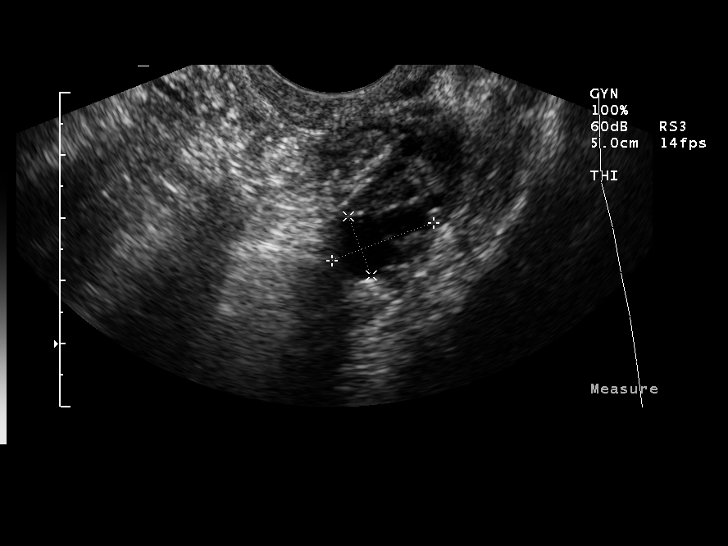
[im 64/64]
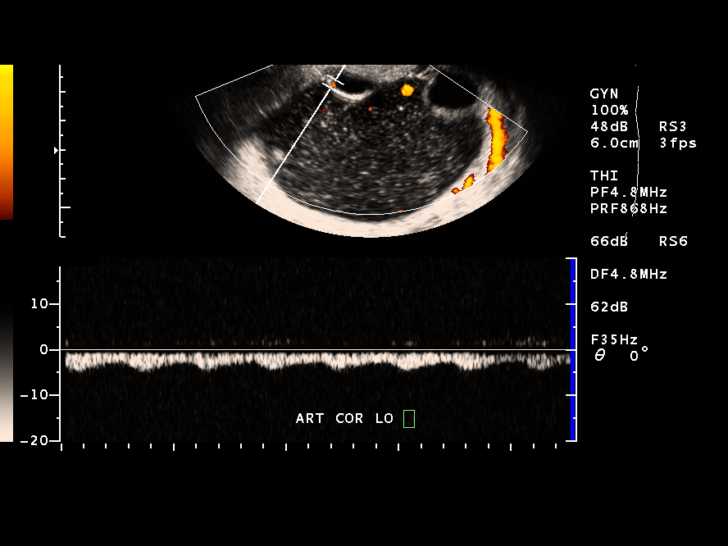

[14 of 25 positions shown; findings below may reference images not displayed]

FINDINGS: The uterus is surgically absent.  The right ovary measures 3.6 x
x 2.6 cm. Several small right ovarian follicles are apparent. A normal left
ovary is not identified. An 11.1 x 5.9 x 7.7 cm complex cystic lesion is
identified in the left adnexal space. Different cystic components of the lesion
contained fluid of differing echogenicity. Venous and low resistance arterial
wave forms are identified within the septa of this complex 11 cm cystic left
adnexal process.   A small amount of free fluid is identified in the cul-de-sac.
IMPRESSION: 11 cm complex cystic lesion in the left adnexal space is worrisome for cystic
ovarian neoplasm.

## 2007-10-06 IMAGING — CR DG CHEST 2V
2 series · 2 of 2 positions shown · non-contrast
Comparison: CT chest 05/04/2006

CLINICAL DATA: Lung lesion

[view not recorded (1 of 2)]
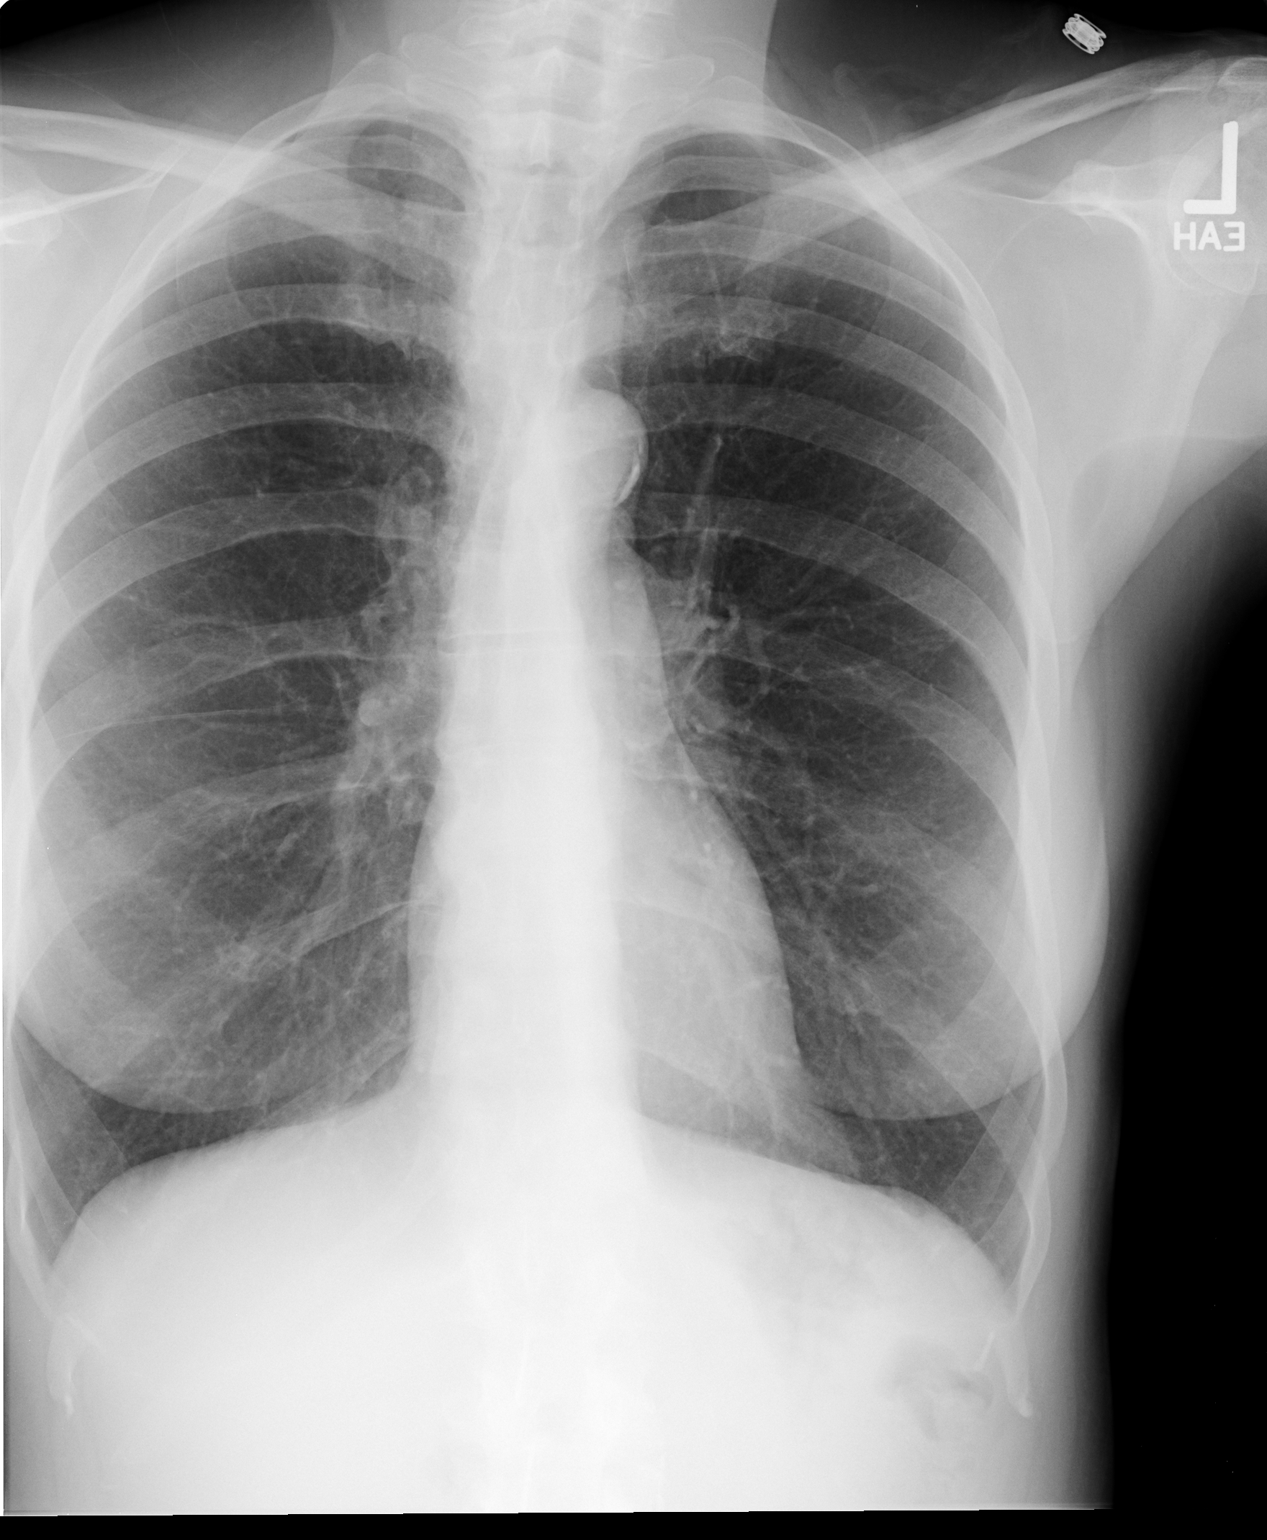

[view not recorded (2 of 2)]
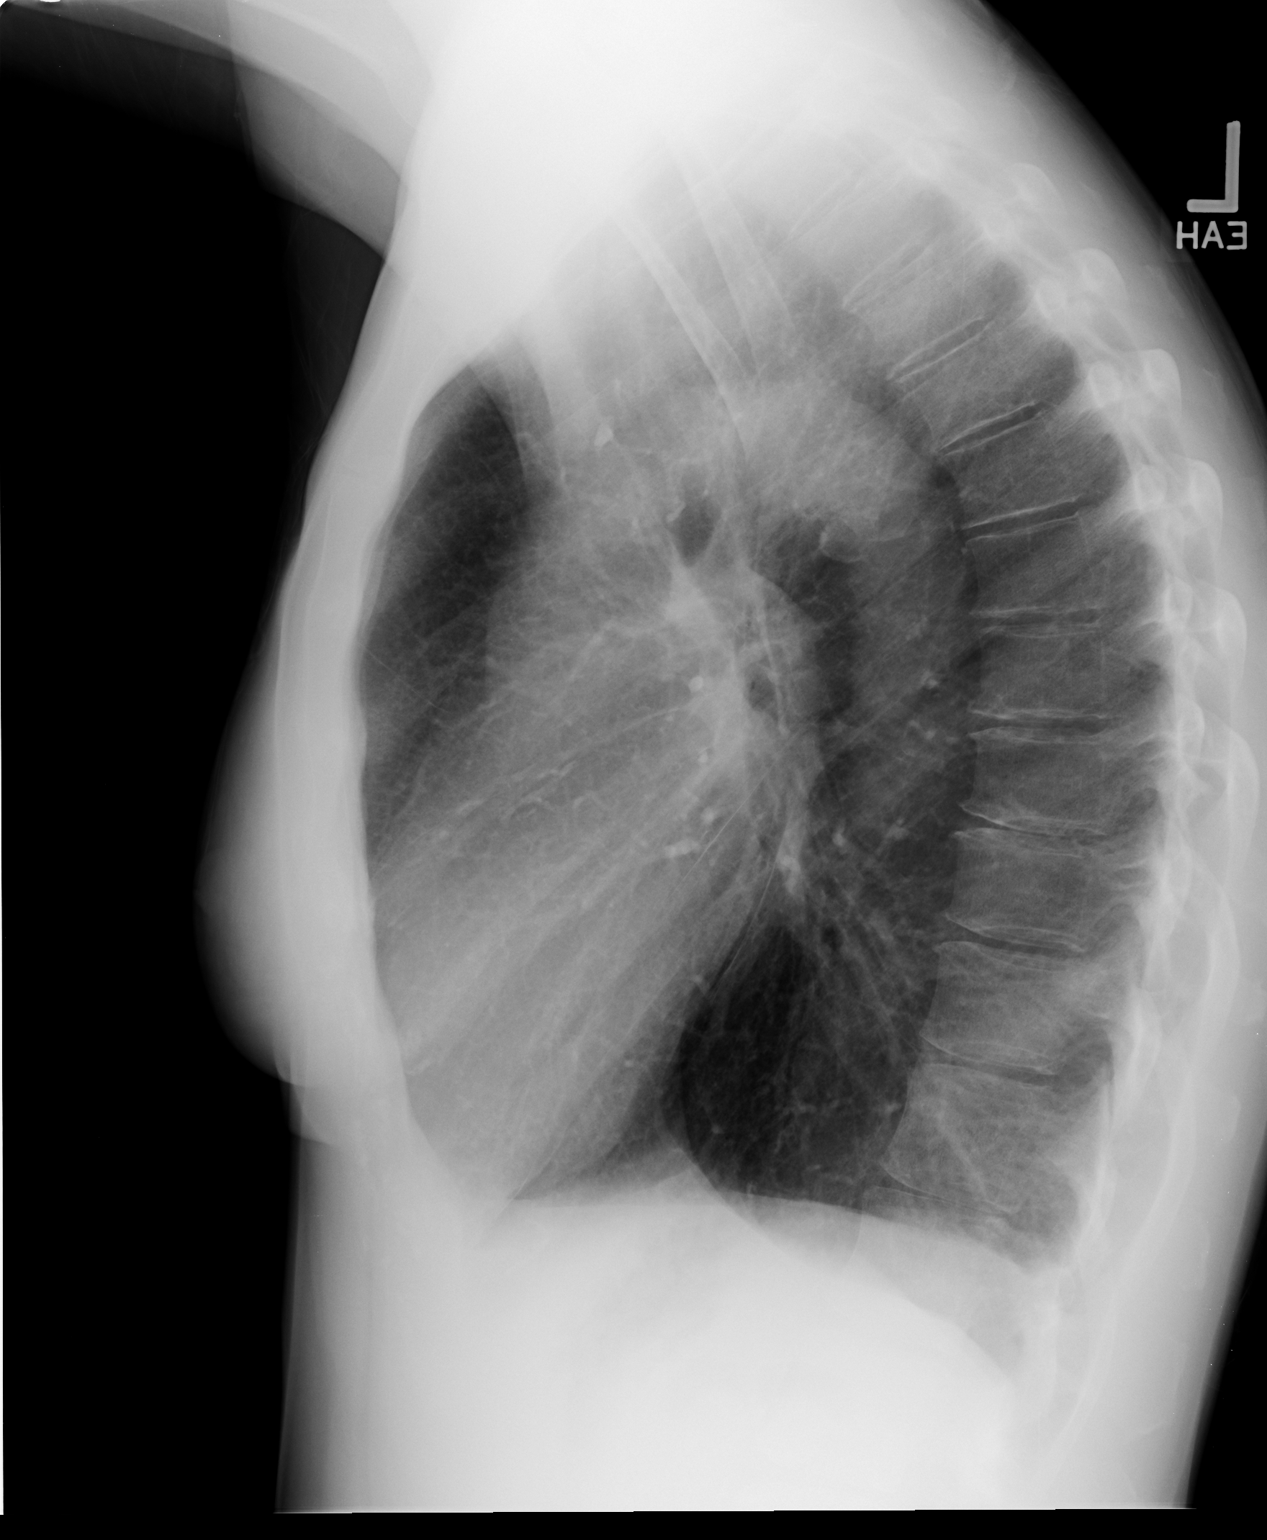

[2 of 2 positions shown; findings below may reference images not displayed]

CHEST - 2 VIEW:

Lungs are hyperexpanded. There is no focal consolidation, edema, or pleural
effusion. Focal density in the medial right apex is consistent with the
spiculated lesion seen on the recent CT scan. Heart size is within normal
limits.
IMPRESSION: Right apical nodule. No edema or focal infiltrate.

## 2007-10-08 IMAGING — CR DG CHEST 1V PORT
1 series · 1 of 1 positions shown · non-contrast
Comparison: 06/19/2006

CLINICAL DATA: Status post right thoracotomy.

CHEST - 1 VIEW

[view not recorded]
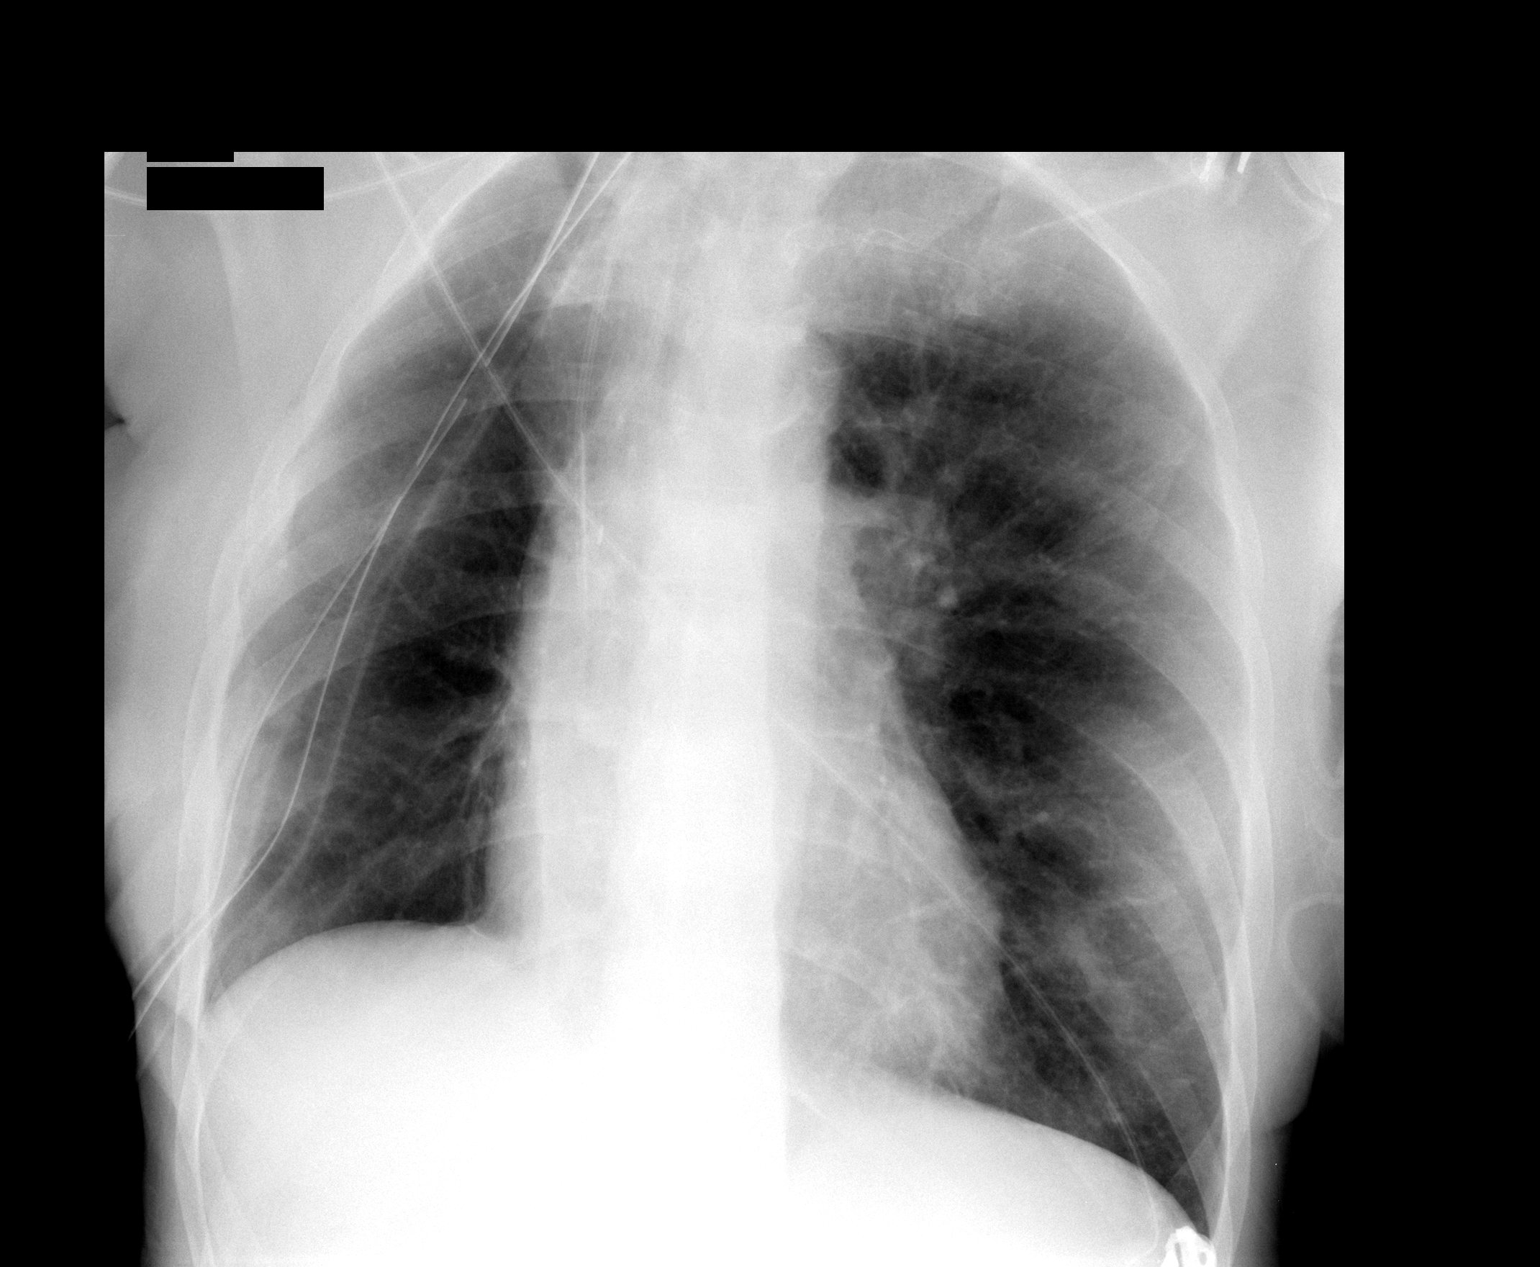

[1 of 1 positions shown; findings below may reference images not displayed]

FINDINGS: Two right-sided chest tubes terminate over the right lung apex. Trace
right lateral pleural air cannot be excluded. No apical pneumothorax.

Right IJ catheter terminates at the mid to low SVC.

Midline trachea. Normal heart size and mediastinal contours. Left lung is clear.

IMPRESSION

1. Two right-sided chest tubes remain in place.  Trace pleural air laterally
cannot be entirely excluded.
2. Right IJ line appropriately positioned.

## 2007-10-10 IMAGING — CR DG CHEST 1V PORT
1 series · 1 of 1 positions shown · non-contrast
Comparison: 06/22/2006

CLINICAL DATA: Chest tube and lung lesions. 
PORTABLE CHEST

[view not recorded]
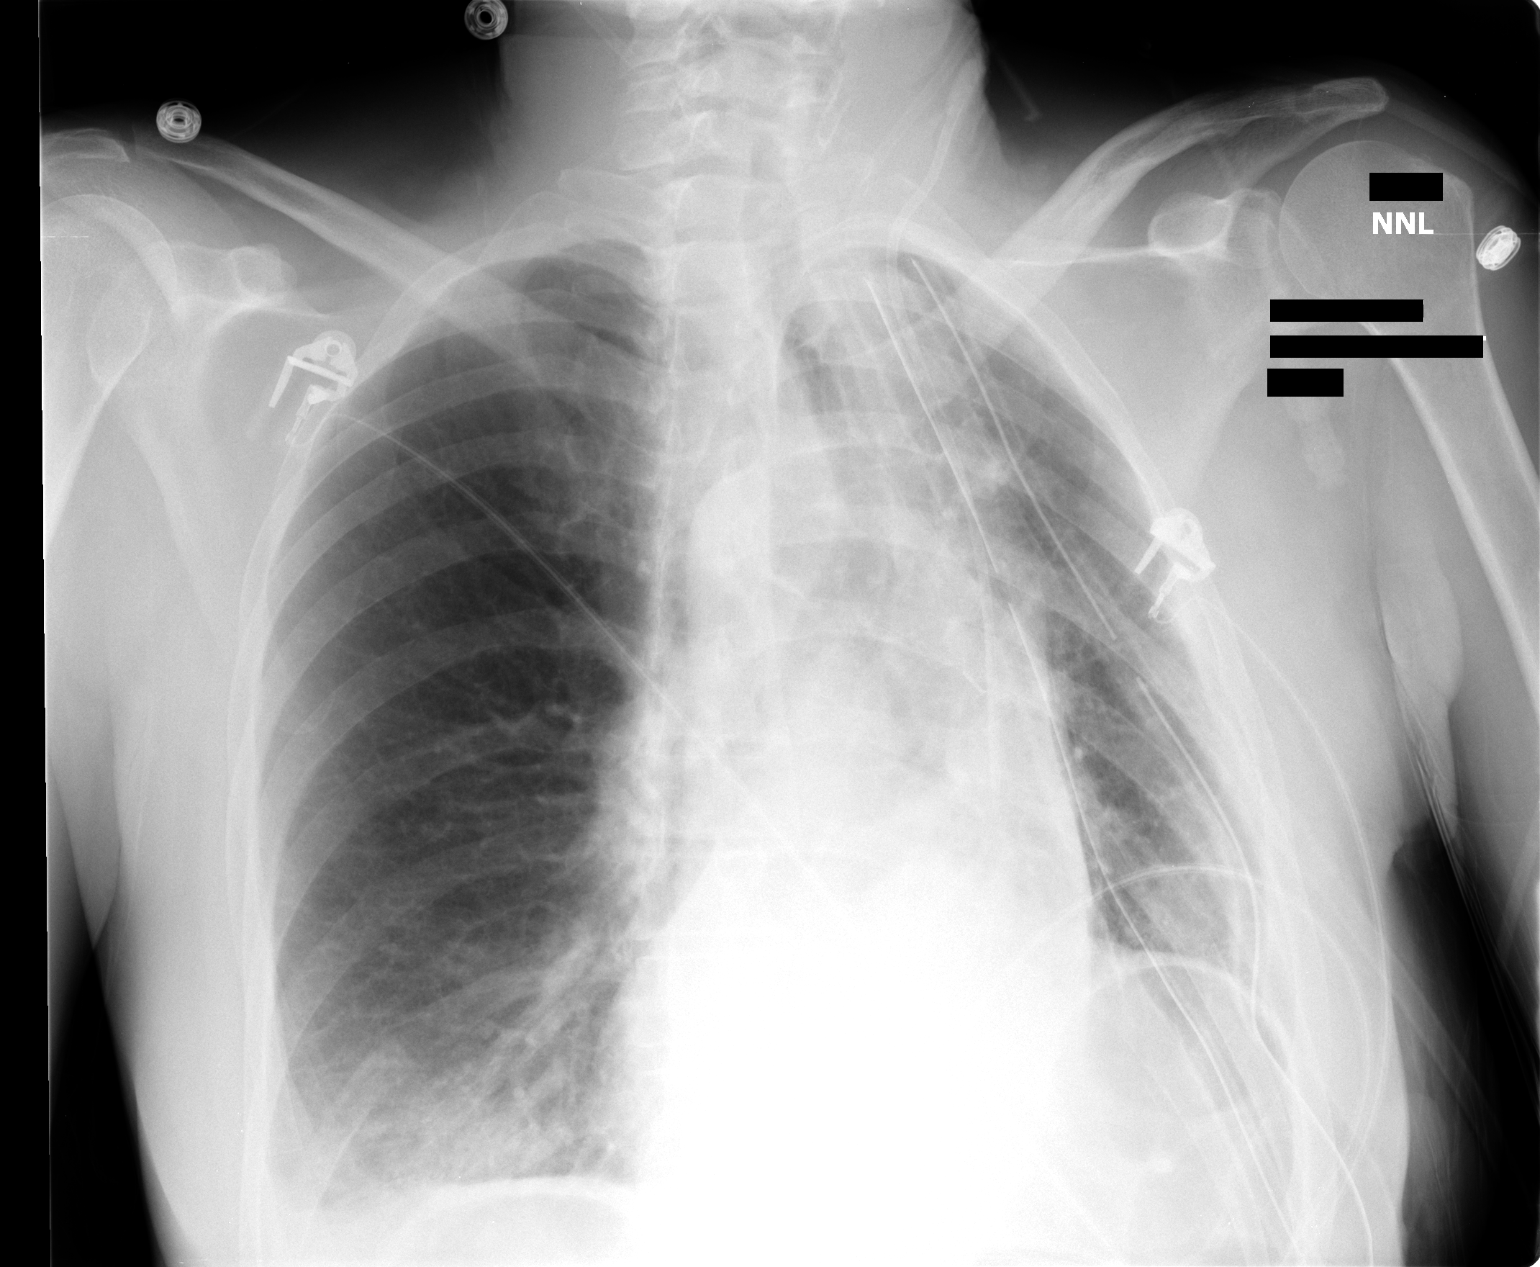

[1 of 1 positions shown; findings below may reference images not displayed]

FINDINGS: Portable semiupright view of the chest was obtained.  Please note that the radiograph is mislabeled with regards to the left and right side of the patient.  The patient is rotated on this film, which does limit the evaluation. The right chest tubes remain in the apex region. There is no evidence for a large pneumothorax. There may be increased volume loss in the right lung, but this is difficult to appreciate due to the patient rotation. In addition, there appears to be new opacities in the left lung base suggesting atelectasis or developing infection. Cardiac silhouette is grossly stable. 
The right IJ central venous catheter remains at the cavoatrial junction.
IMPRESSION: 1.  Limited study due to patient rotation.  There is no large pneumothorax.
2.  Increasing airspace densities in the left lung base concerning for atelectasis or developing infection.

## 2007-10-11 IMAGING — CR DG CHEST 1V PORT
1 series · 1 of 1 positions shown · non-contrast
Comparison: 06/23/06.

CLINICAL DATA: Short of breath.  
 PORTABLE CHEST - 1 VIEW - 06/24/06 AT 4240 HOURS:

[view not recorded]
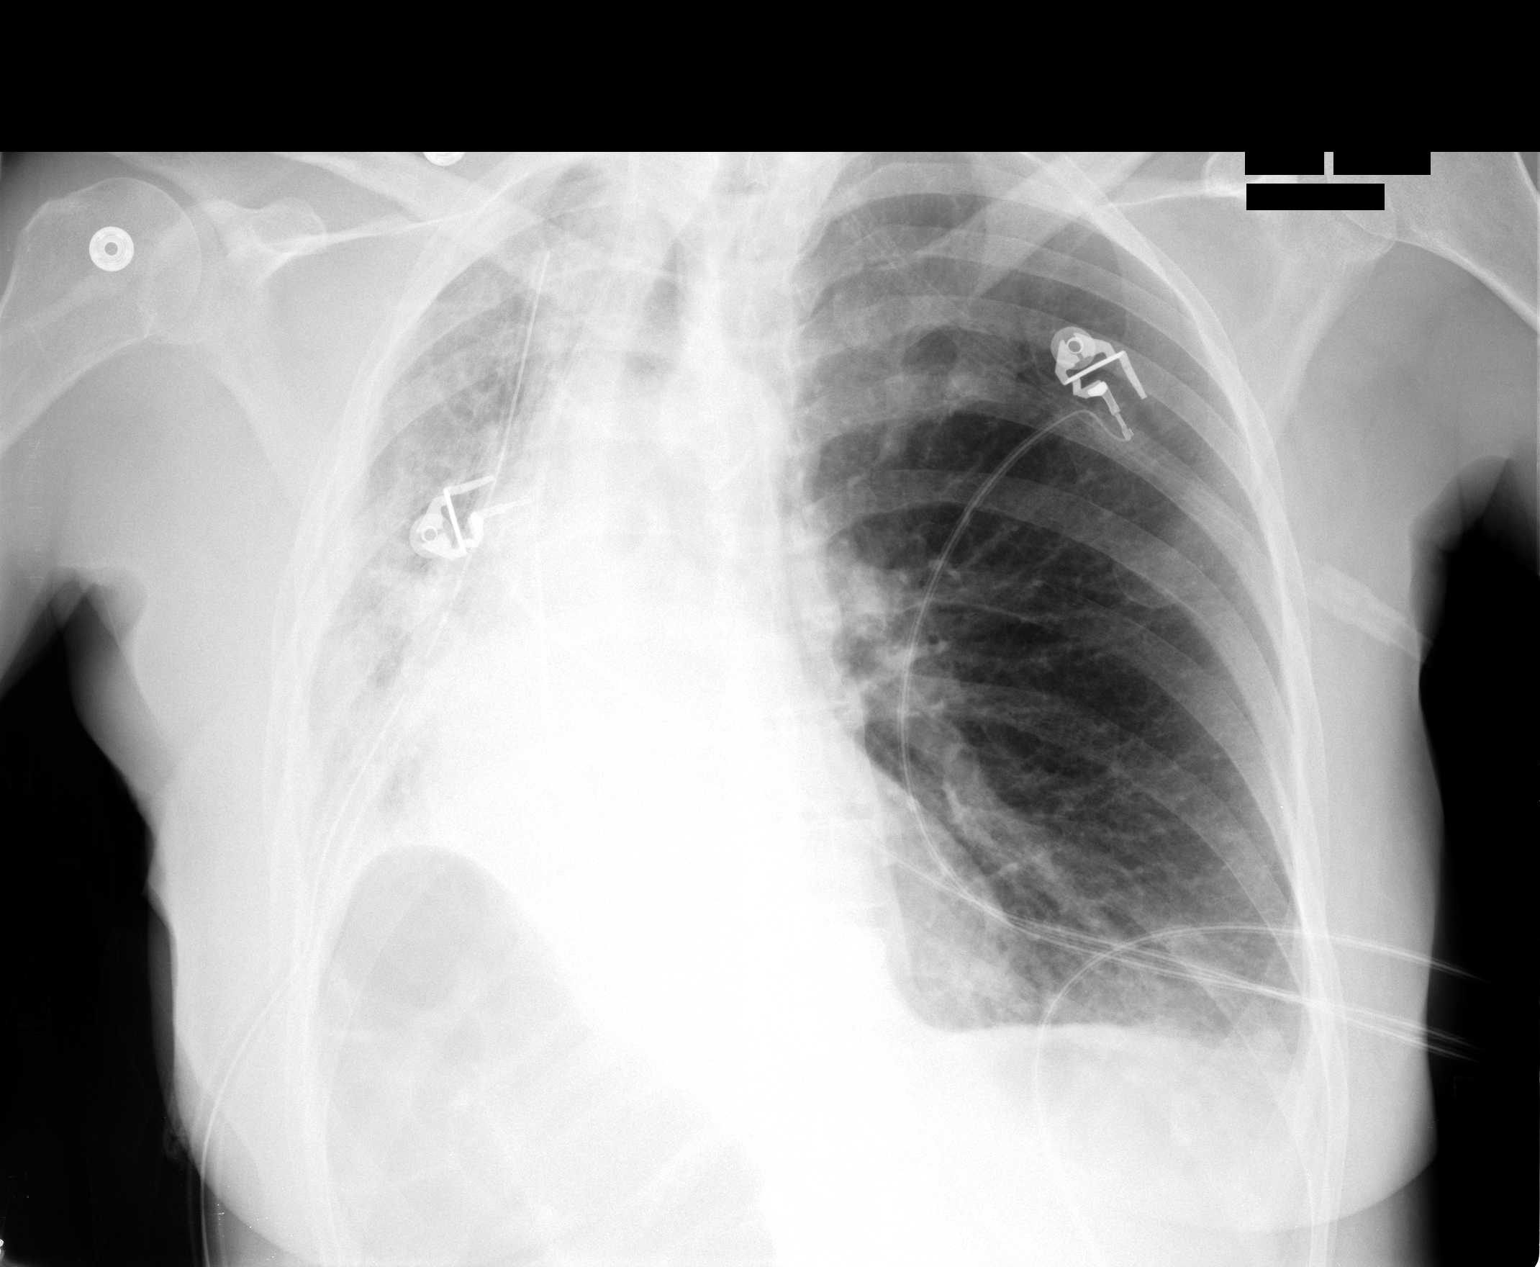

[1 of 1 positions shown; findings below may reference images not displayed]

FINDINGS: The right lung is less well aerated with atelectasis having increased.  One of the two right chest tubes has been removed and no pneumothorax is seen. The left lung is clear.
IMPRESSION: One of two right chest tubes removed with apparent increase in volume loss throughout the right lung.

## 2007-10-12 IMAGING — CR DG CHEST 1V PORT
2 series · 2 of 2 positions shown · non-contrast
Comparison: Same day.

CLINICAL DATA: Lung lesion. Post bronchoscopy.
 PORTABLE CHEST - 1 VIEW:

[view not recorded (1 of 2)]
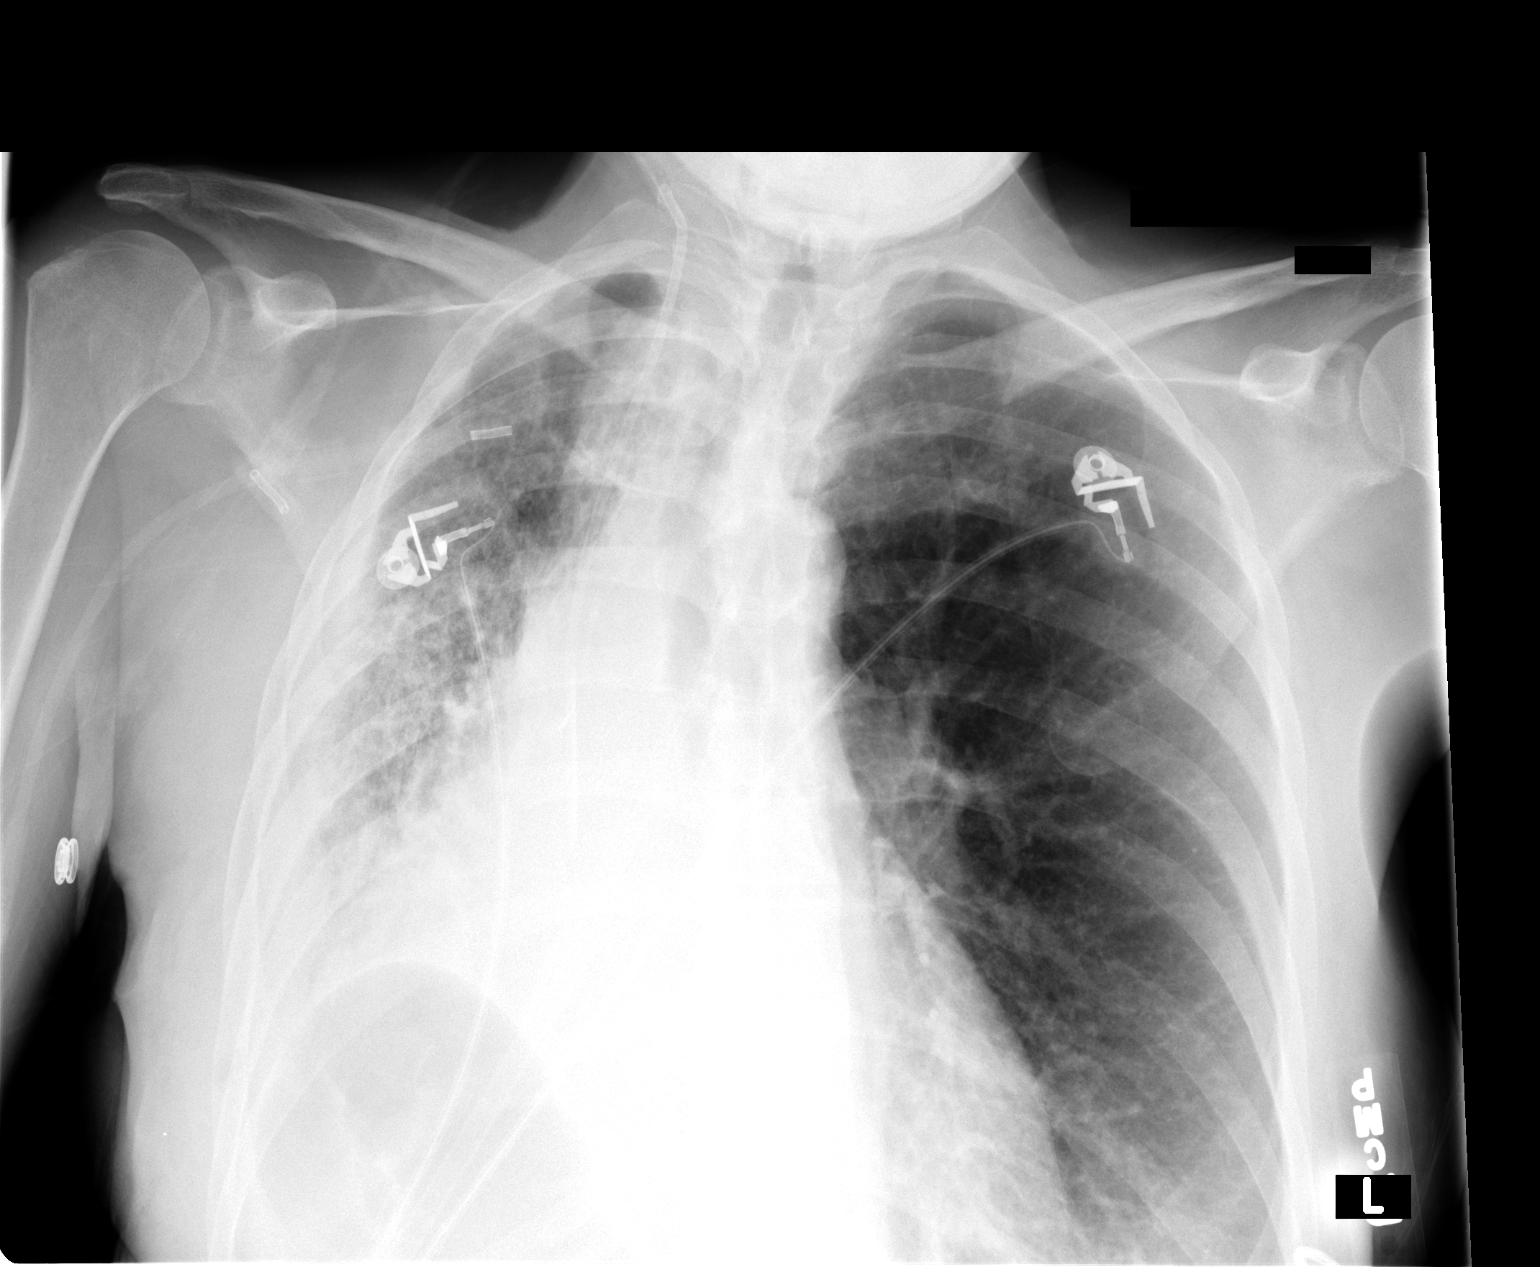

[view not recorded (2 of 2)]
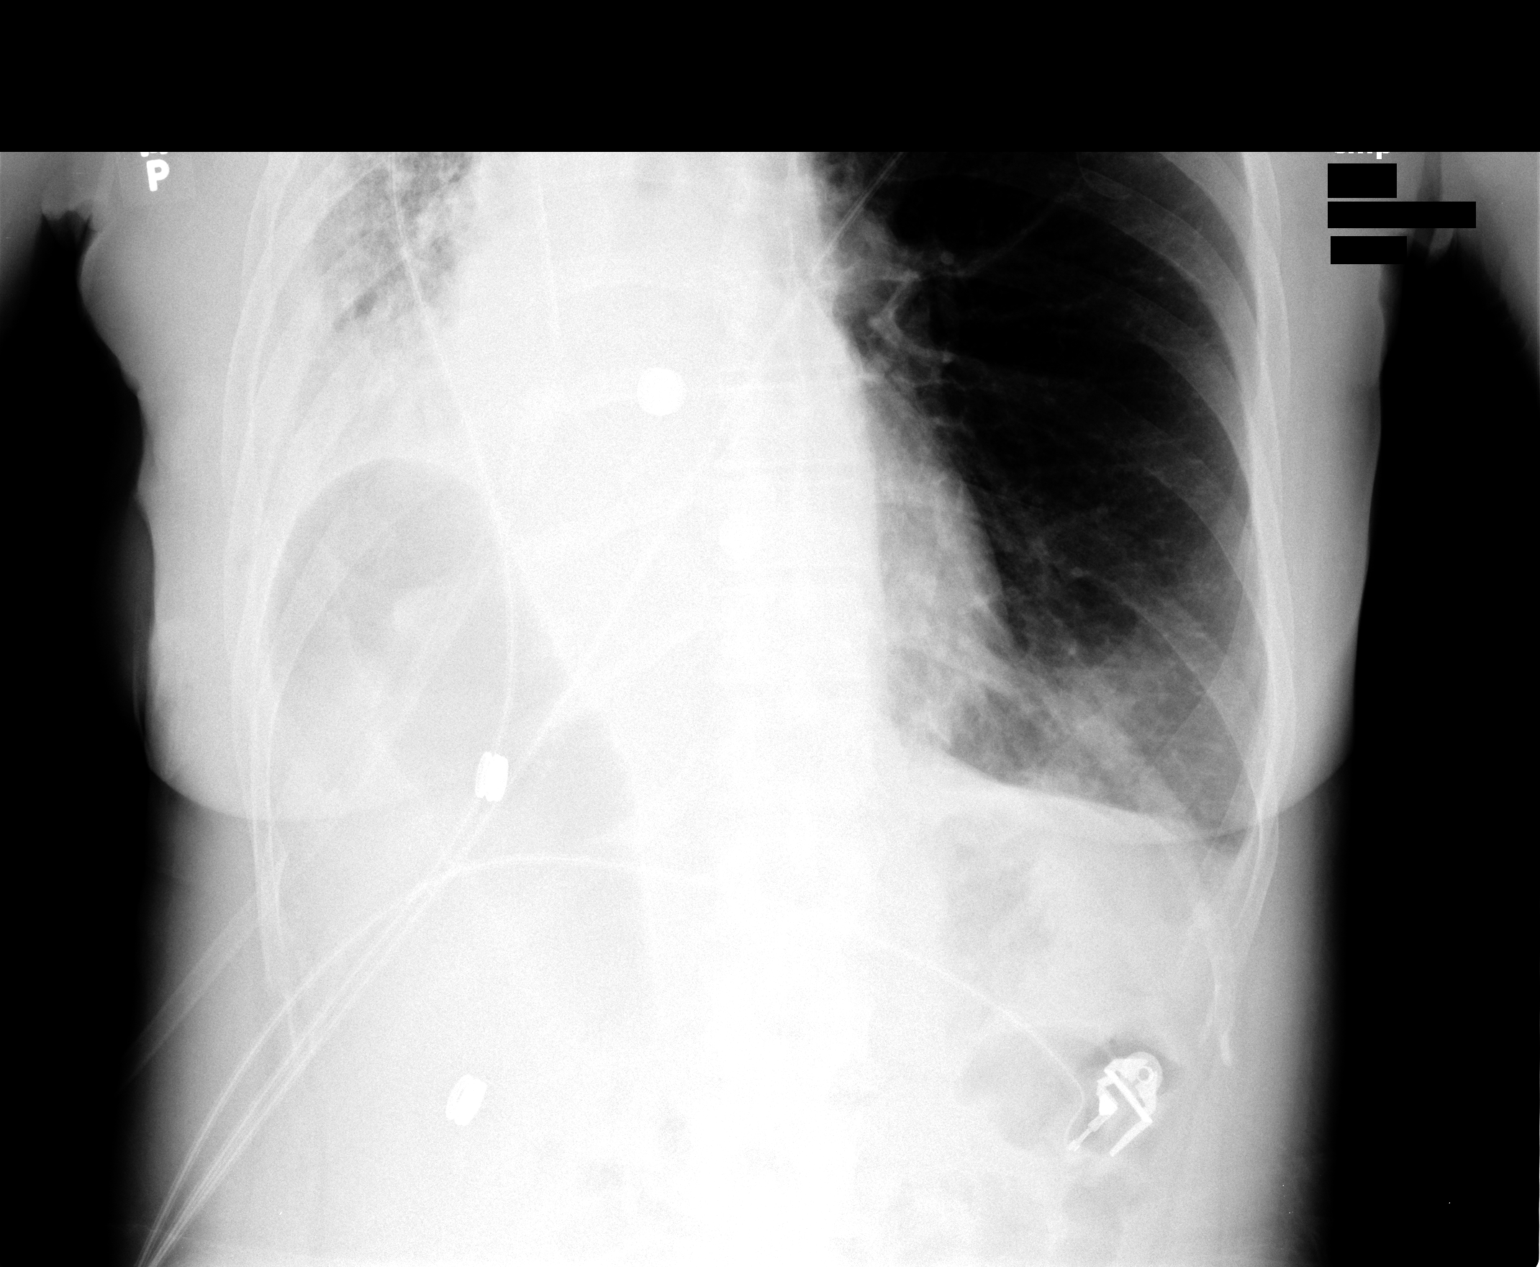

[2 of 2 positions shown; findings below may reference images not displayed]

FINDINGS: There has been interval decrease in the size of a right pleural effusion.  Underlying right lung airspace disease is seen.  No pneumothorax. Visualized left lung clear.
IMPRESSION: Decrease in right pleural effusion after bronchoscopy without pneumothorax. Right lung airspace disease.

## 2007-10-14 IMAGING — CR DG CHEST 2V
2 series · 2 of 2 positions shown · non-contrast
Comparison: 06/26/06.

CLINICAL DATA: Lung lesion. Follow-up aeration postoperatively. 
 CHEST - 2 VIEW:

[w chest pa]
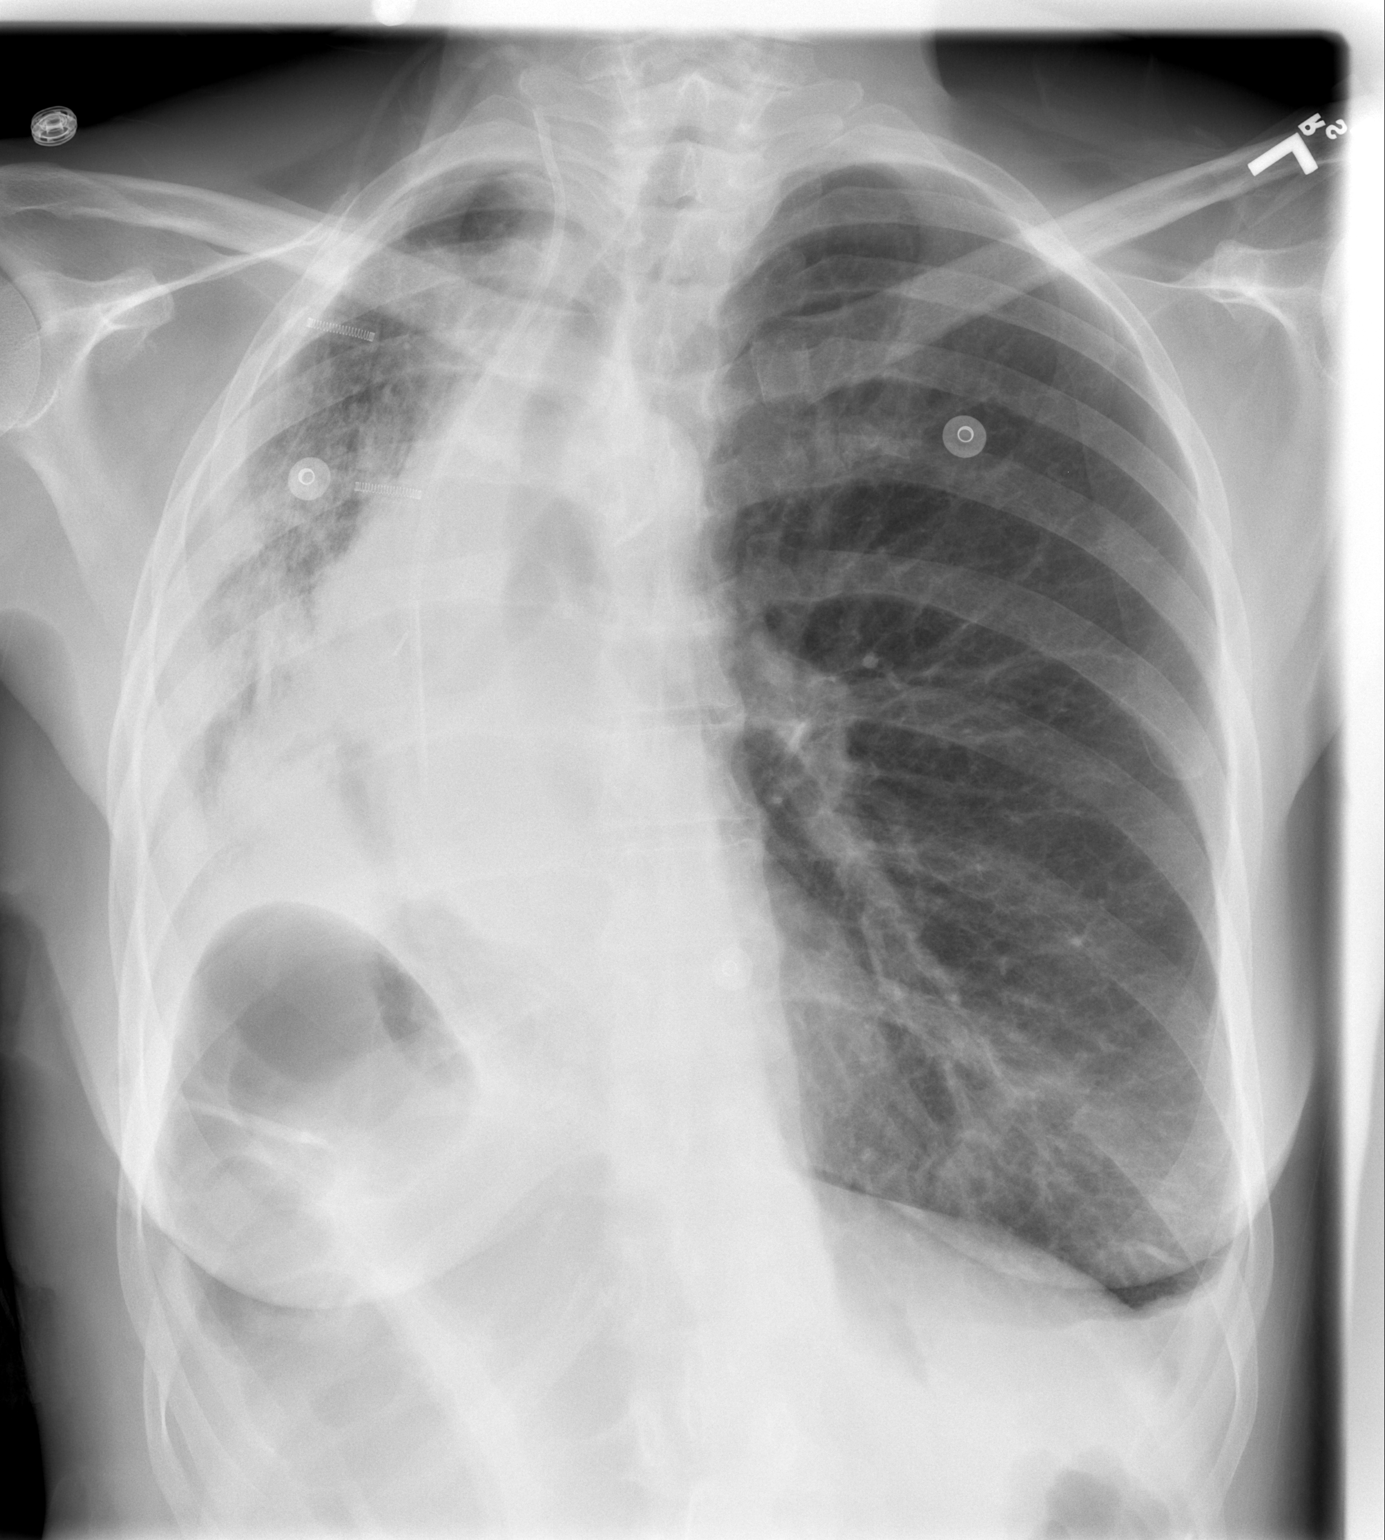

[w chest lat]
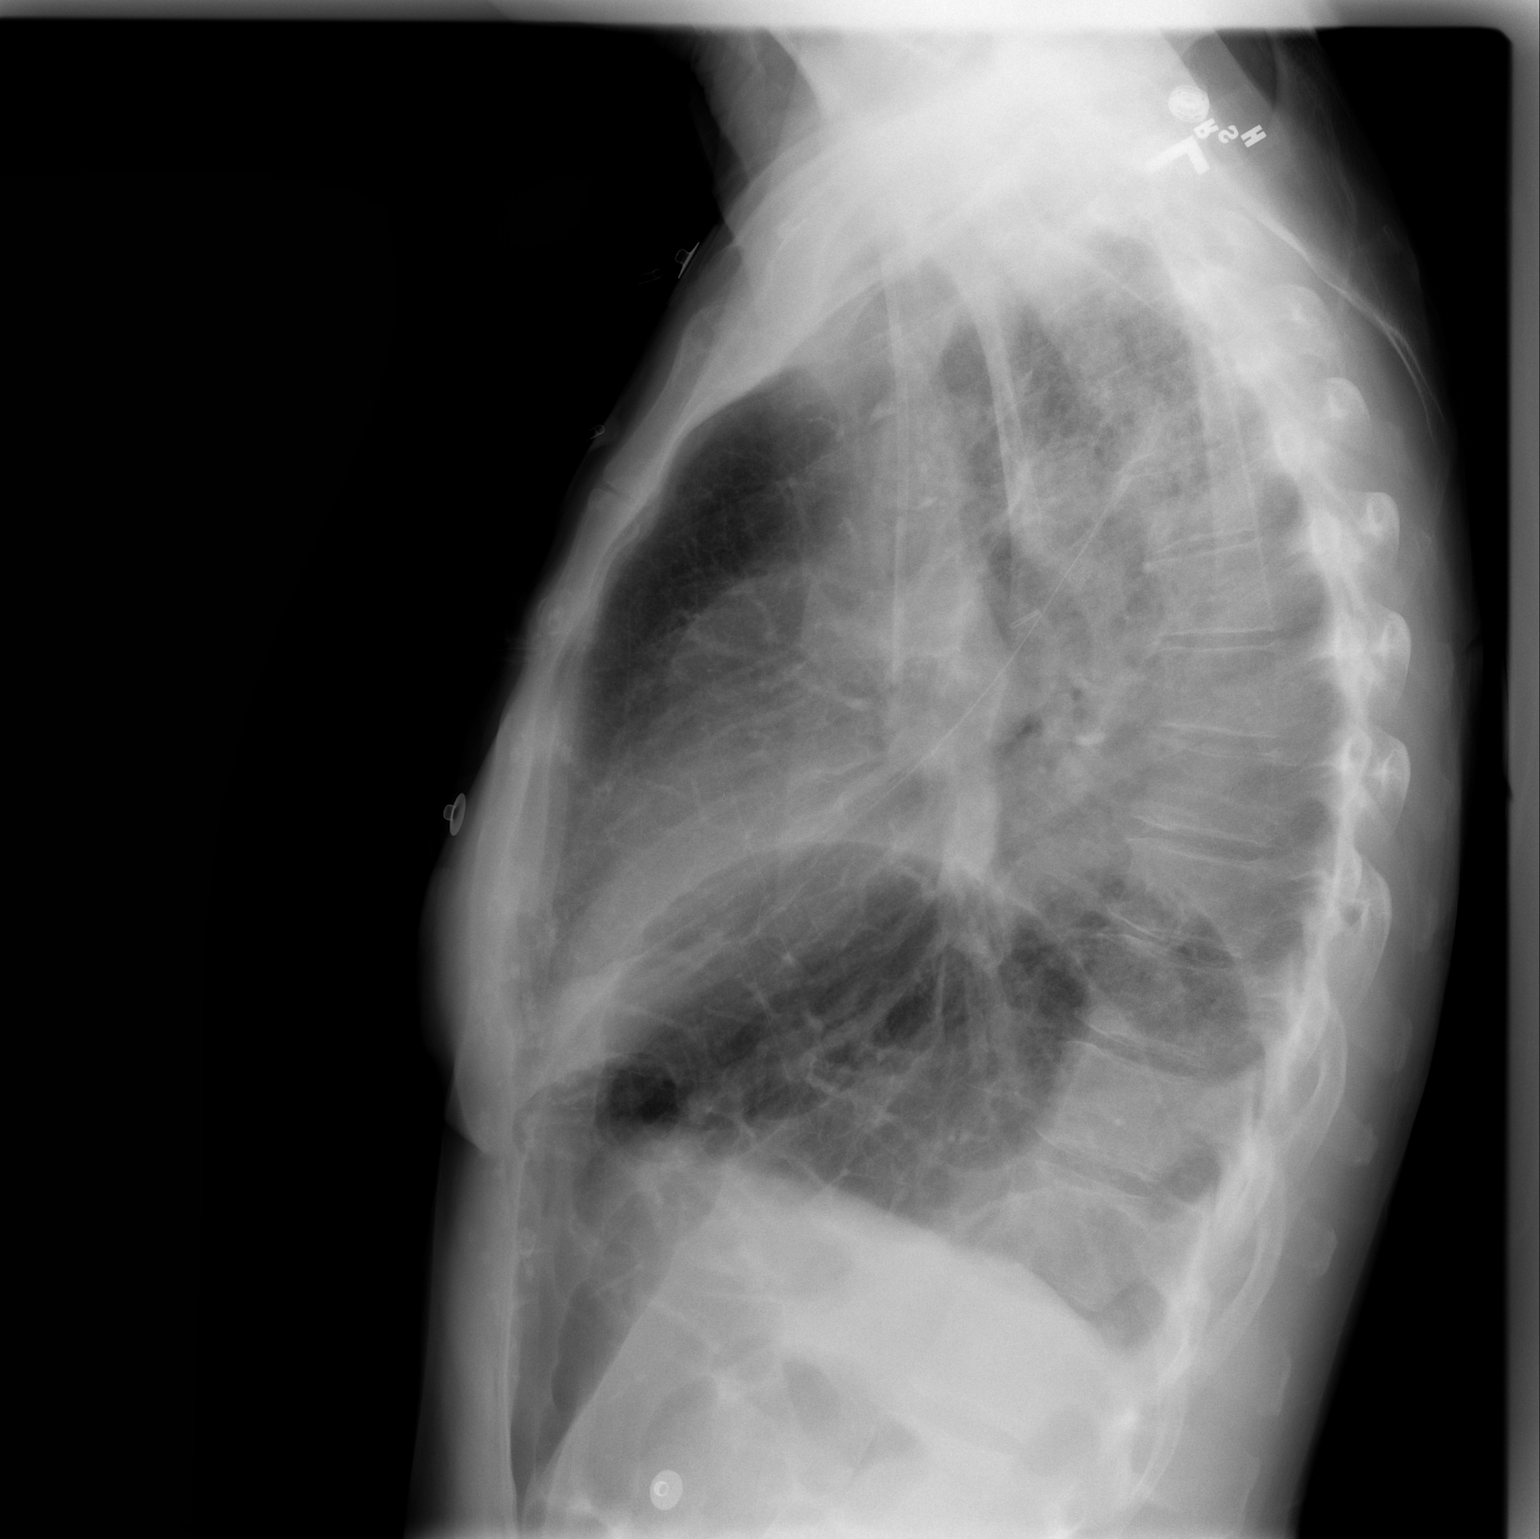

[2 of 2 positions shown; findings below may reference images not displayed]

FINDINGS: There is mild worsening aeration of the right lung base with further volume loss. There is compensatory hyperinflation of the left lung. There is no pneumothorax. The remainder of the study is unchanged.
IMPRESSION: Volume loss within the right hemithorax with mild worsening aeration of the right lung base.

## 2007-10-15 IMAGING — CR DG CHEST 2V
2 series · 2 of 2 positions shown · non-contrast
Comparison: 06/27/2006

CLINICAL DATA: Lung lesion. Right-sided atelectasis.

CHEST - 2 VIEW

[w chest pa]
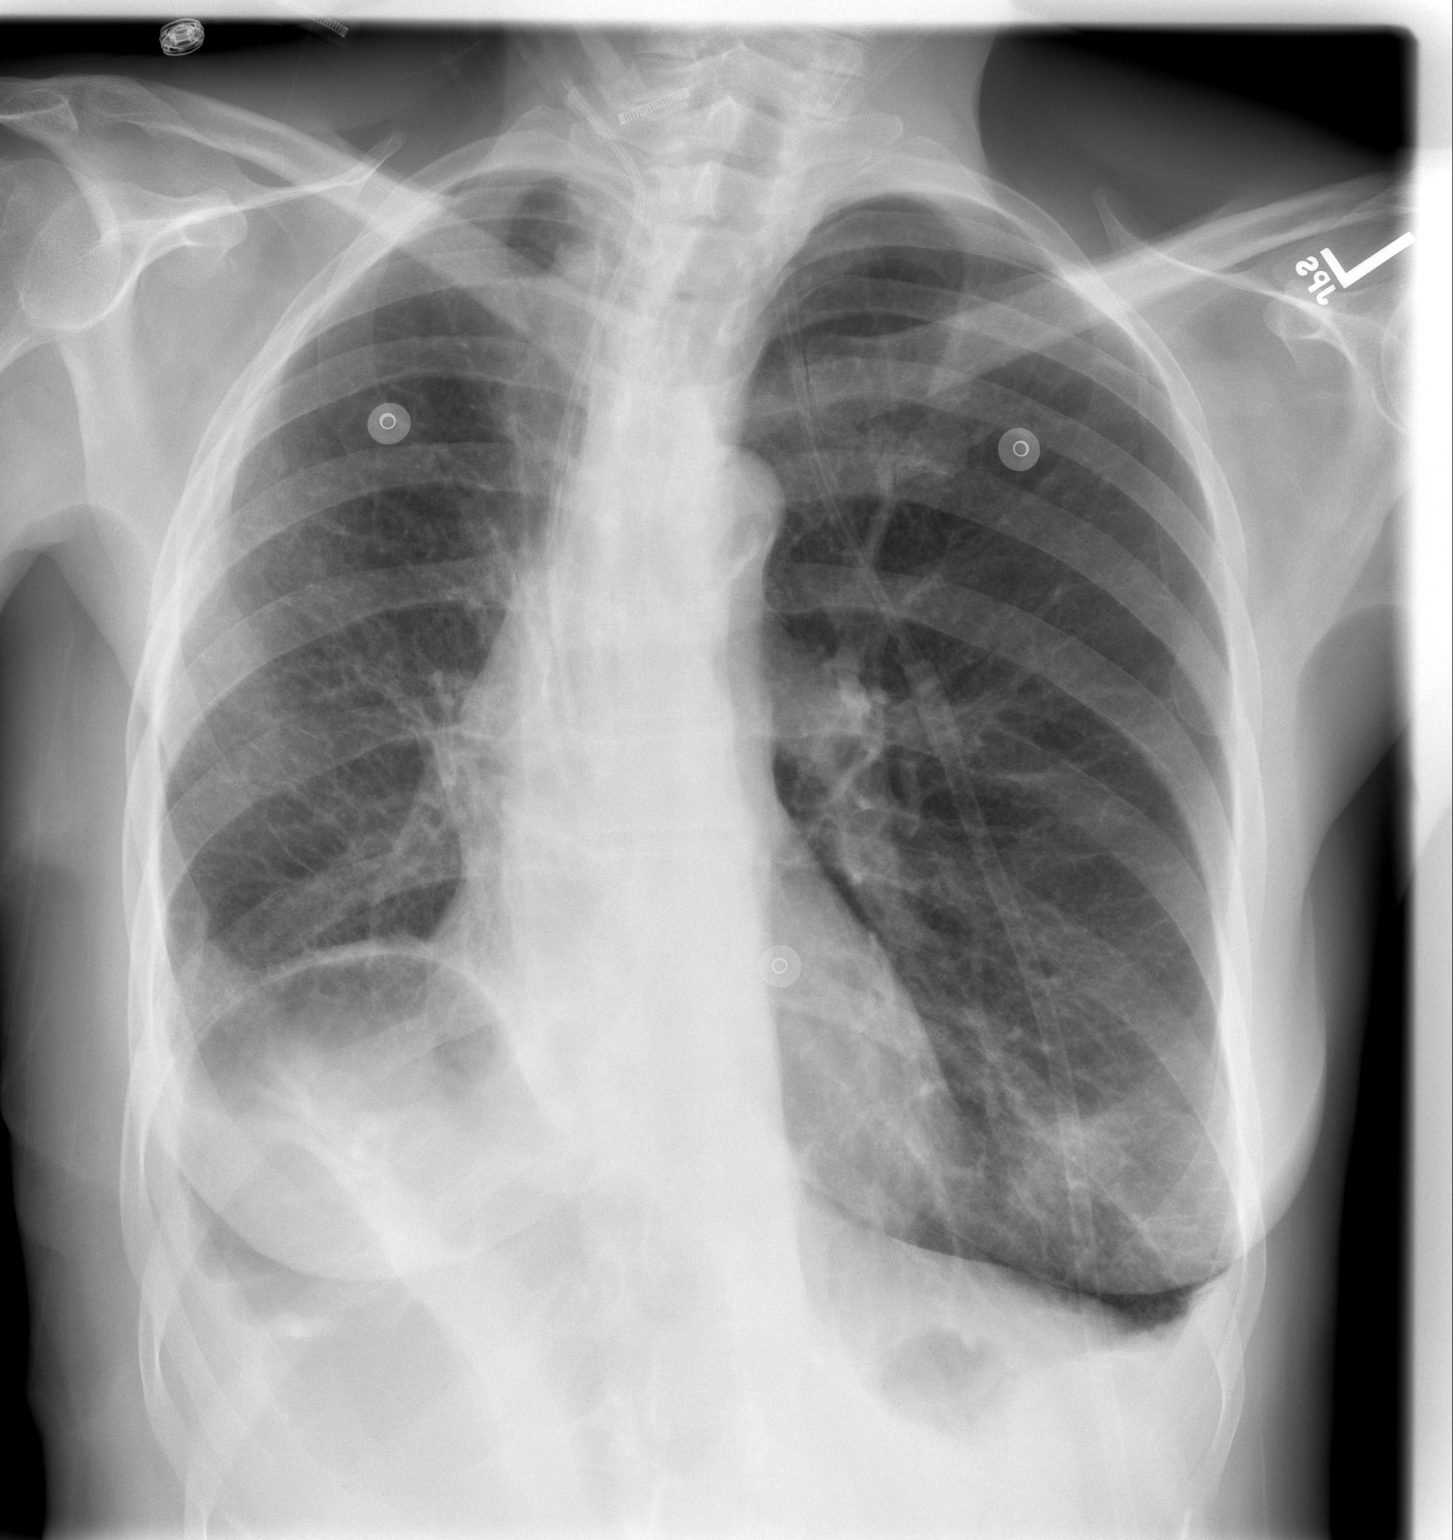

[w chest lat]
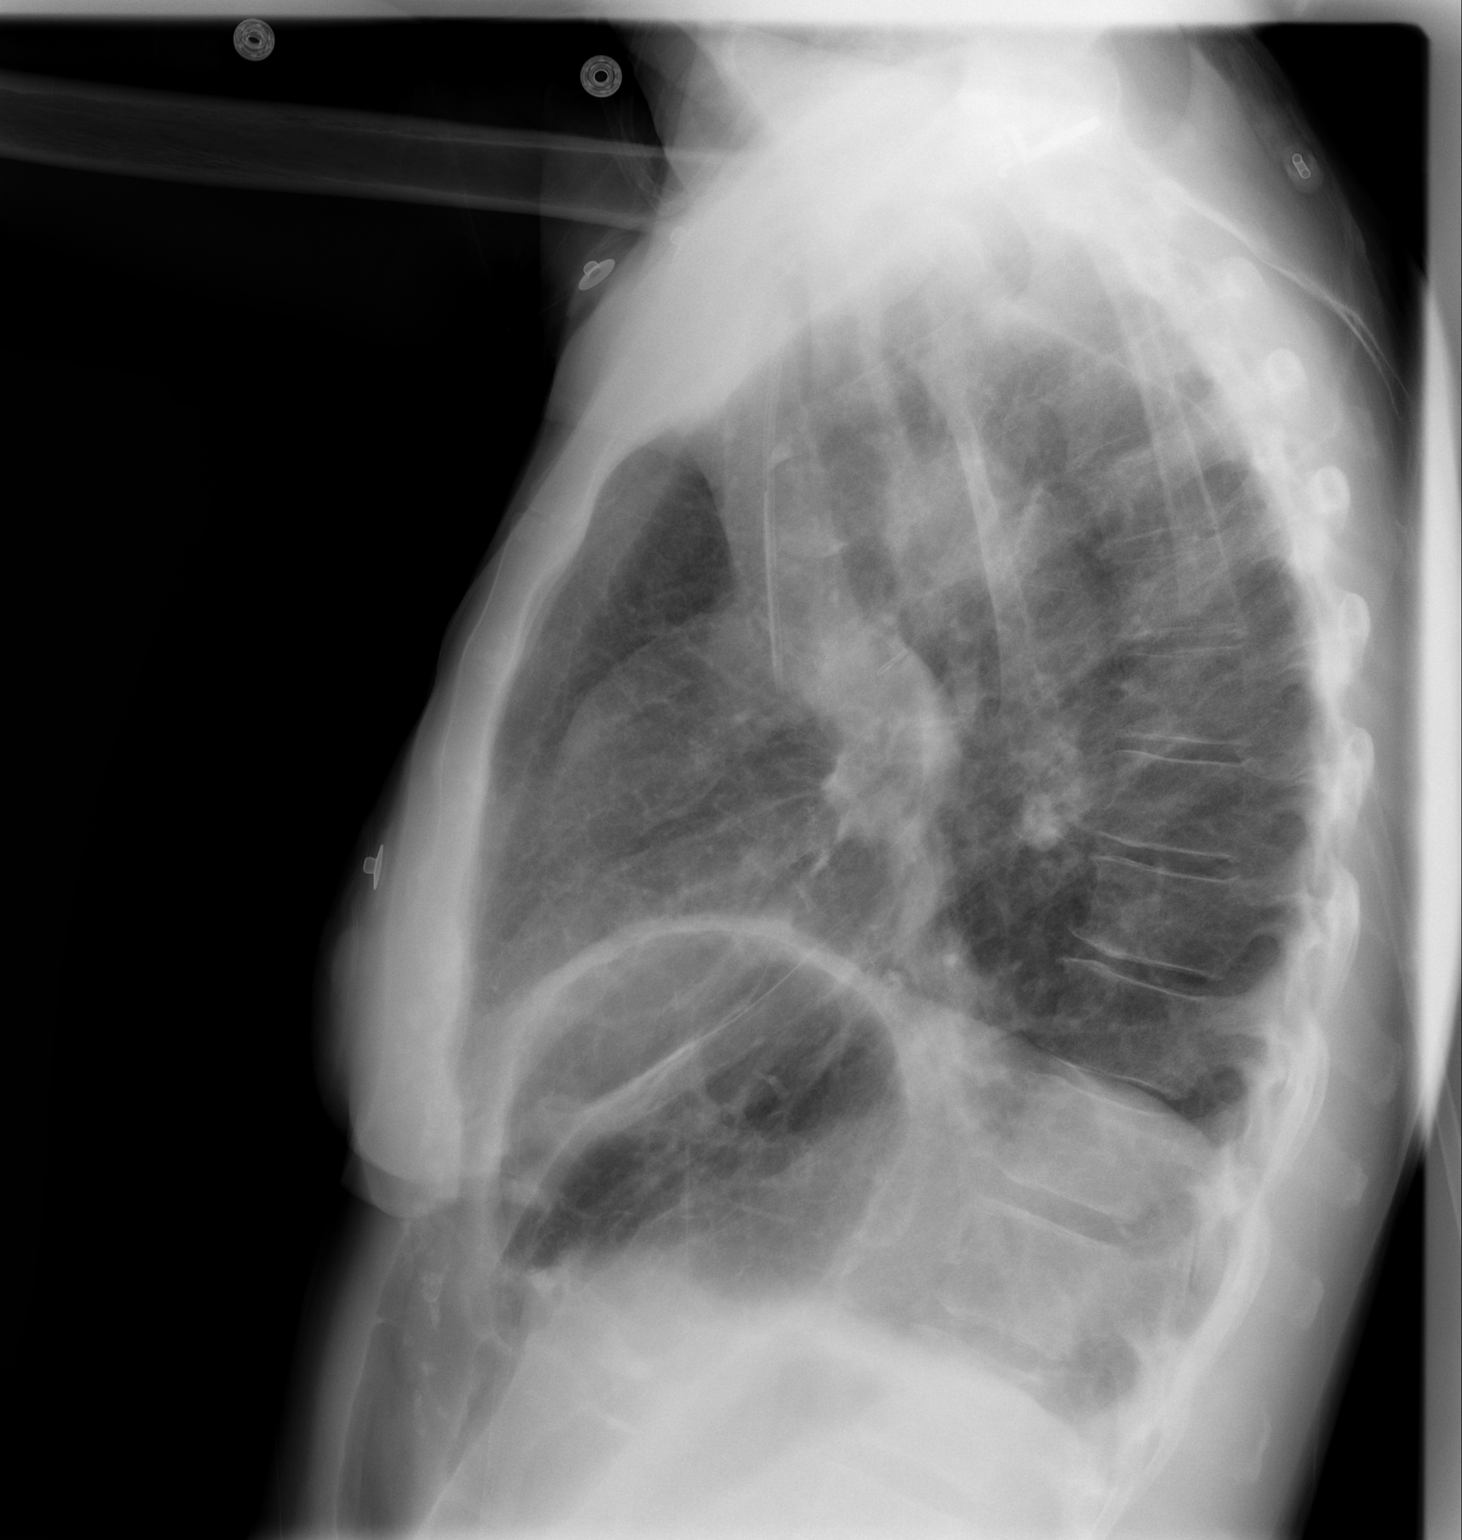

[2 of 2 positions shown; findings below may reference images not displayed]

FINDINGS: Much of the atelectasis in the right lung has resolved, with improved
aeration and reduced volume loss. There continues to be some mild elevation of
the right hemidiaphragm.  Right-sided central line tip projects over the
superior vena cava.

There is continued blunting of left costophrenic angle. Indistinct opacity
persists at the left lung base and could represent pneumonia or atelectasis.

Heart size is within normal limits.

IMPRESSION

1. Markedly improved atelectasis in the right lung compared to prior exam.
2. Persistent mild airspace opacity at the left lung base.

## 2007-10-16 IMAGING — CR DG CHEST 2V
2 series · 2 of 2 positions shown · non-contrast
Comparison: 06/28/2006

CLINICAL DATA: Lung lesion.

CHEST - 2 VIEW

[w chest pa]
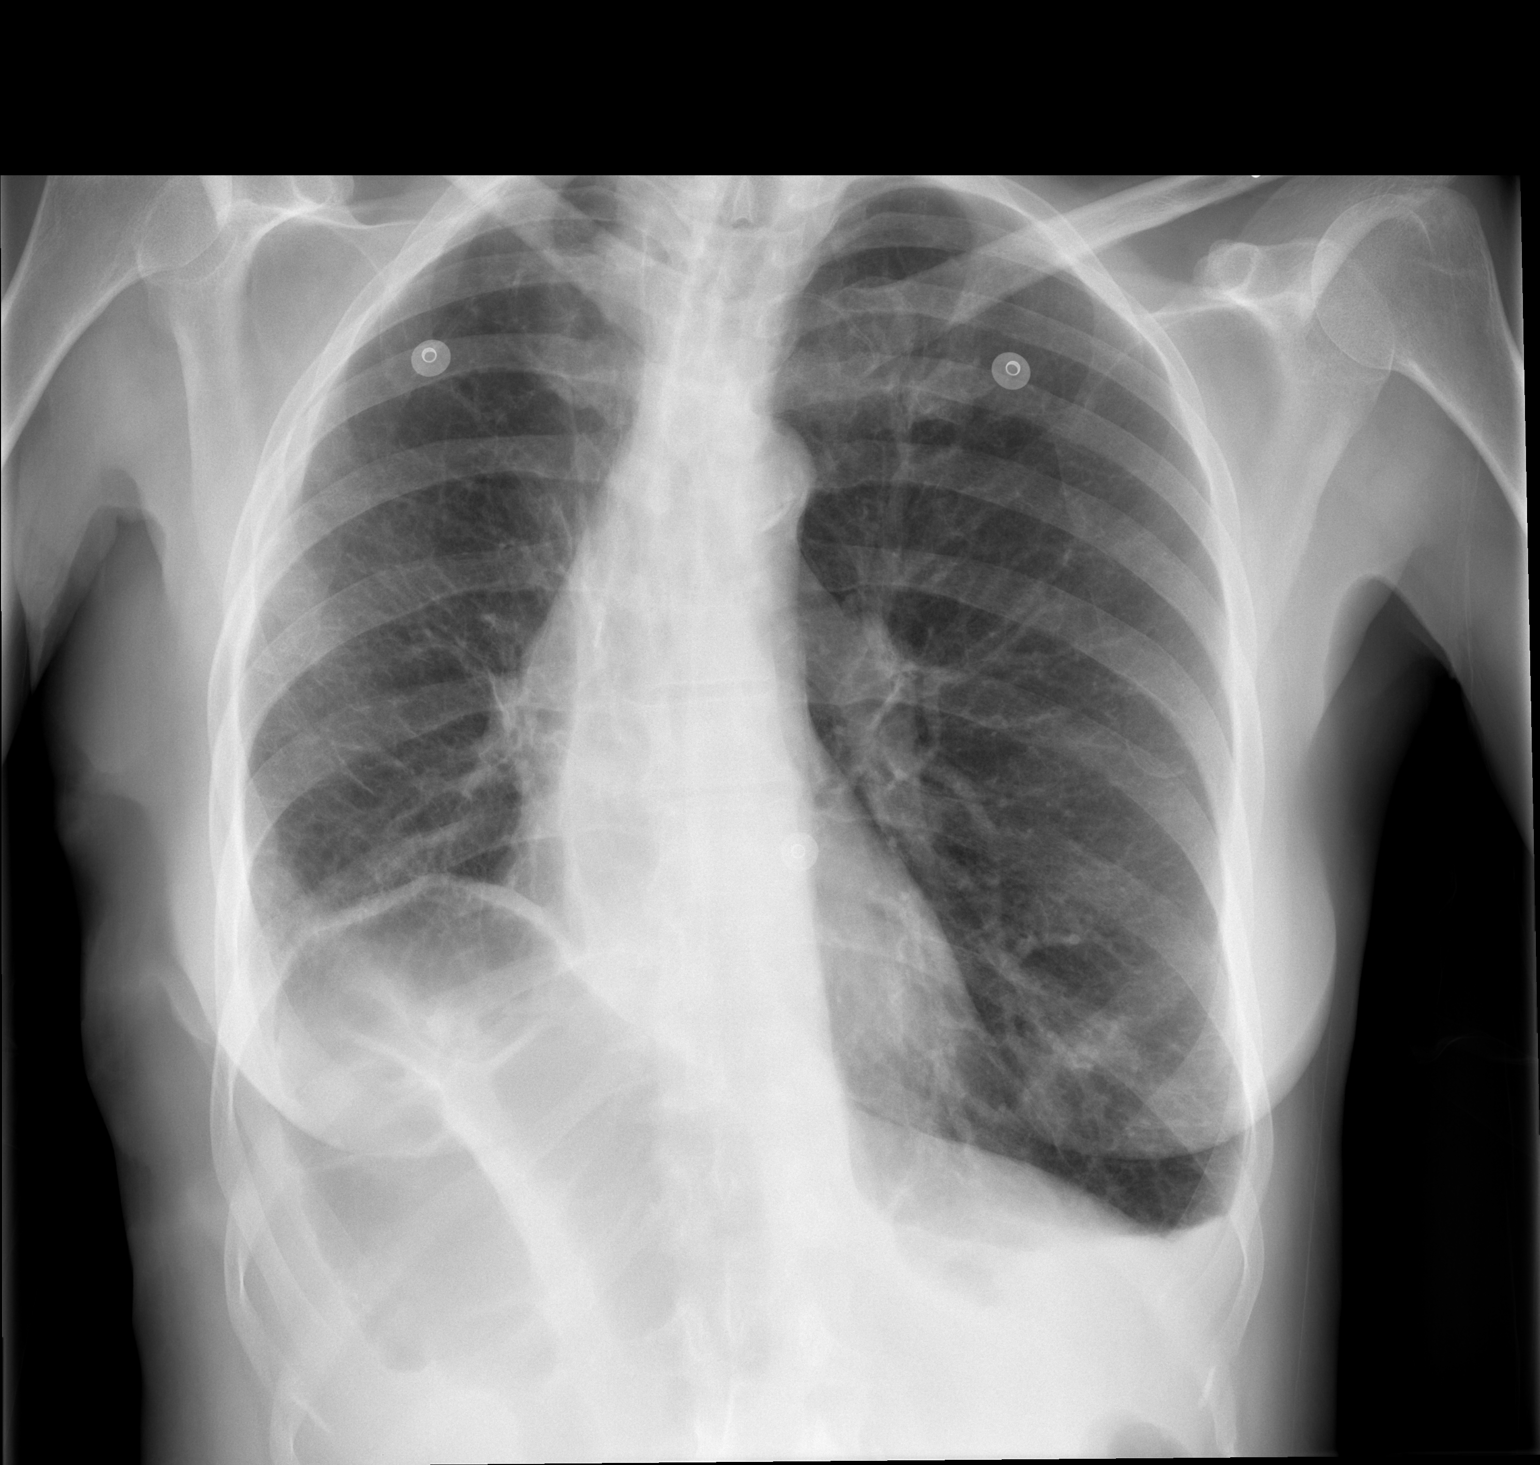

[w chest lat]
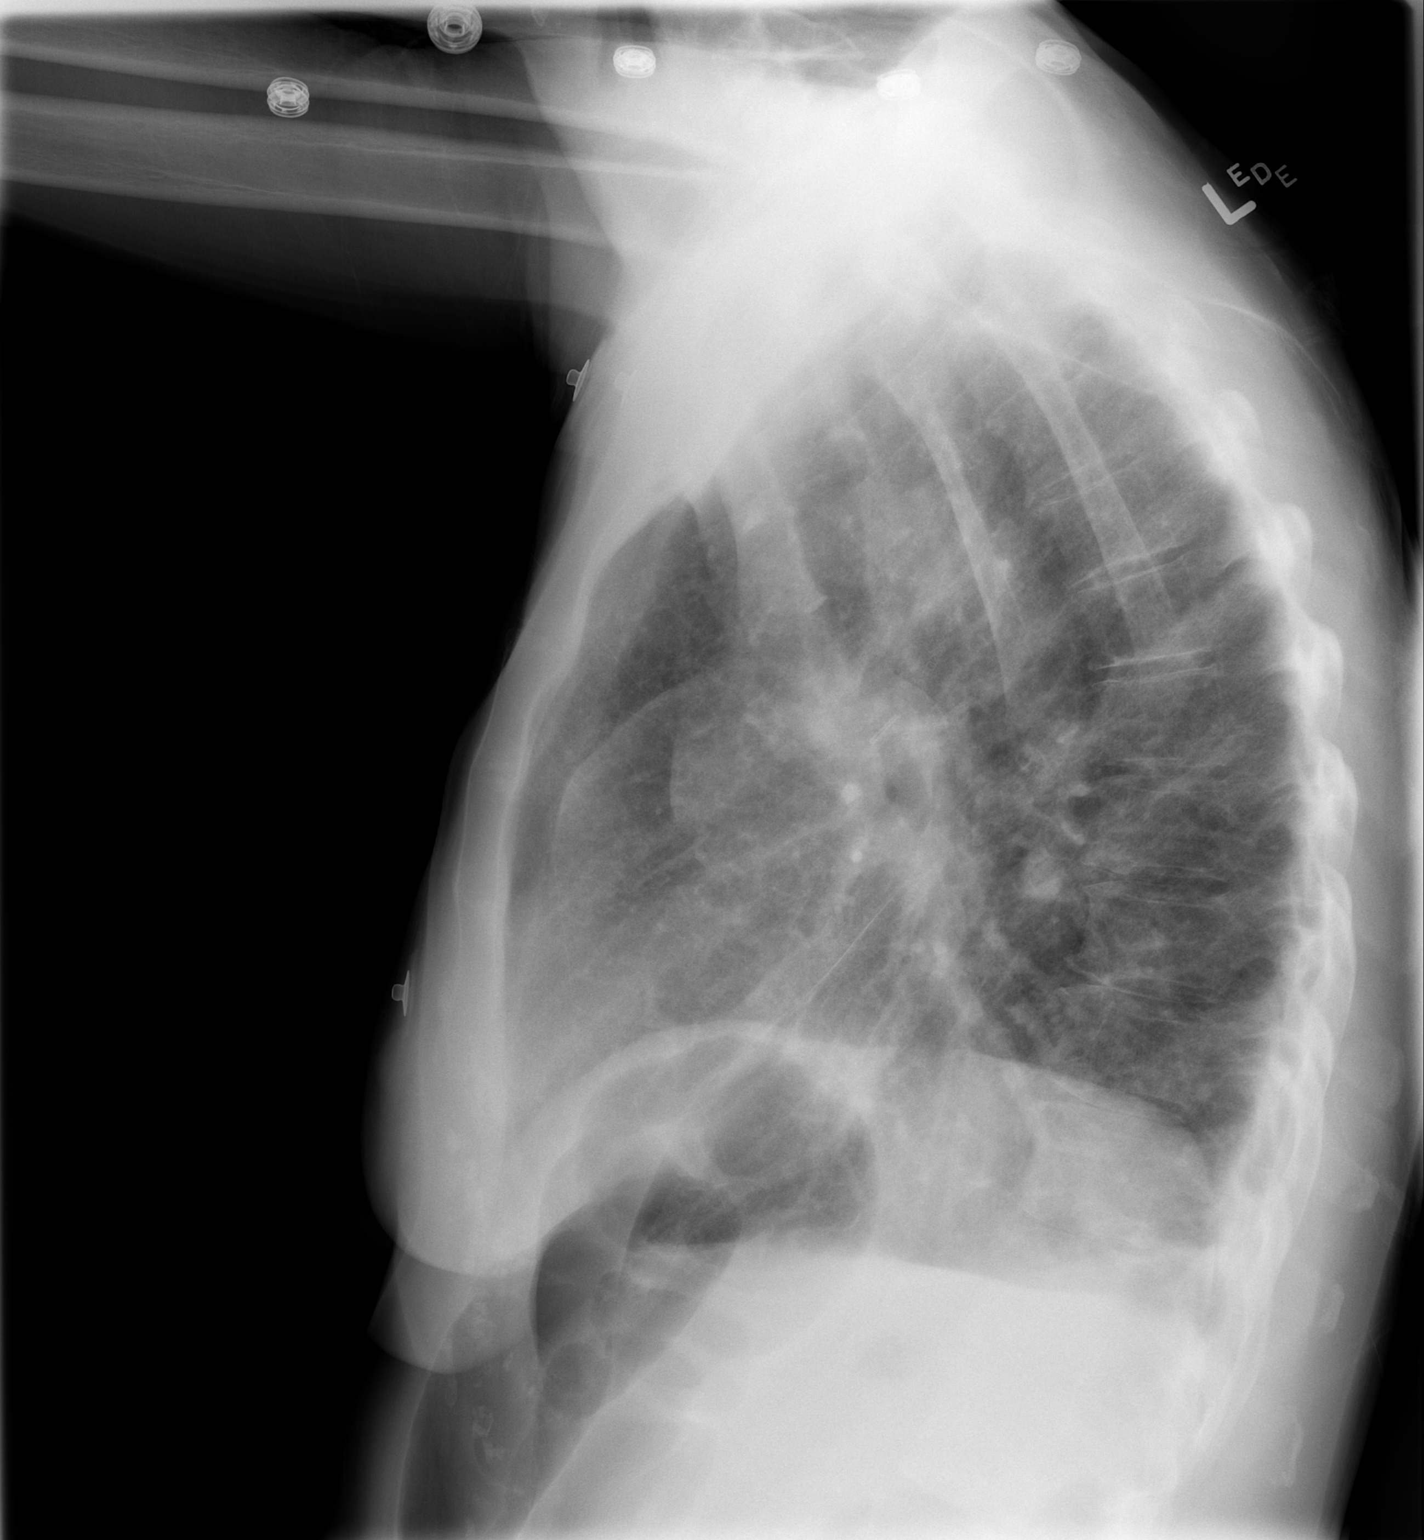

[2 of 2 positions shown; findings below may reference images not displayed]

FINDINGS: The heart size normal. 
No effusion or edema. 
COPD. 
Postop changes and volume loss and at the right base, stable. 
Improved aeration to the left base.
Small left effusion, stable.
No pneumothorax.

IMPRESSION

1. Stable postop changes at right lung base.
2. Improved aeration to left base.

## 2007-10-17 IMAGING — CR DG CHEST 2V
2 series · 2 of 2 positions shown · non-contrast
Comparison: none

HISTORY: Dyspnea, cough, fever, lung lesion status post surgery

[view not recorded (1 of 2)]
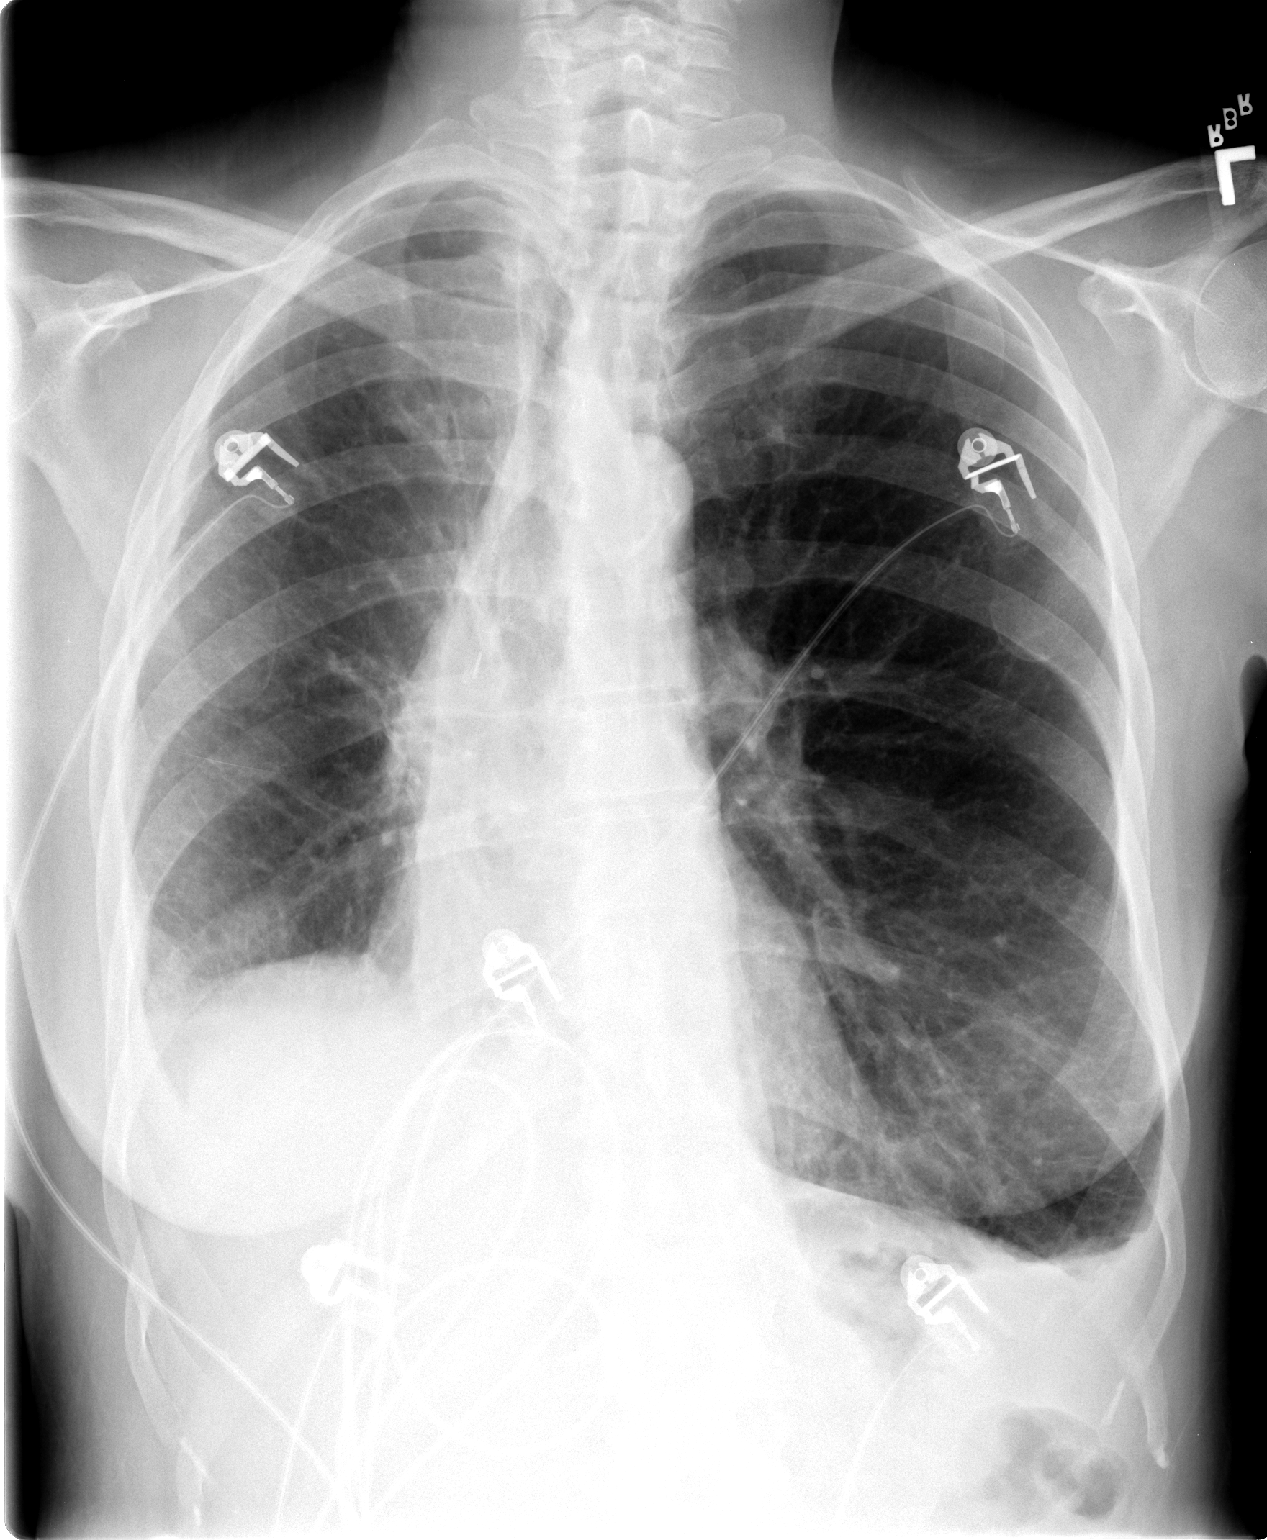

[view not recorded (2 of 2)]
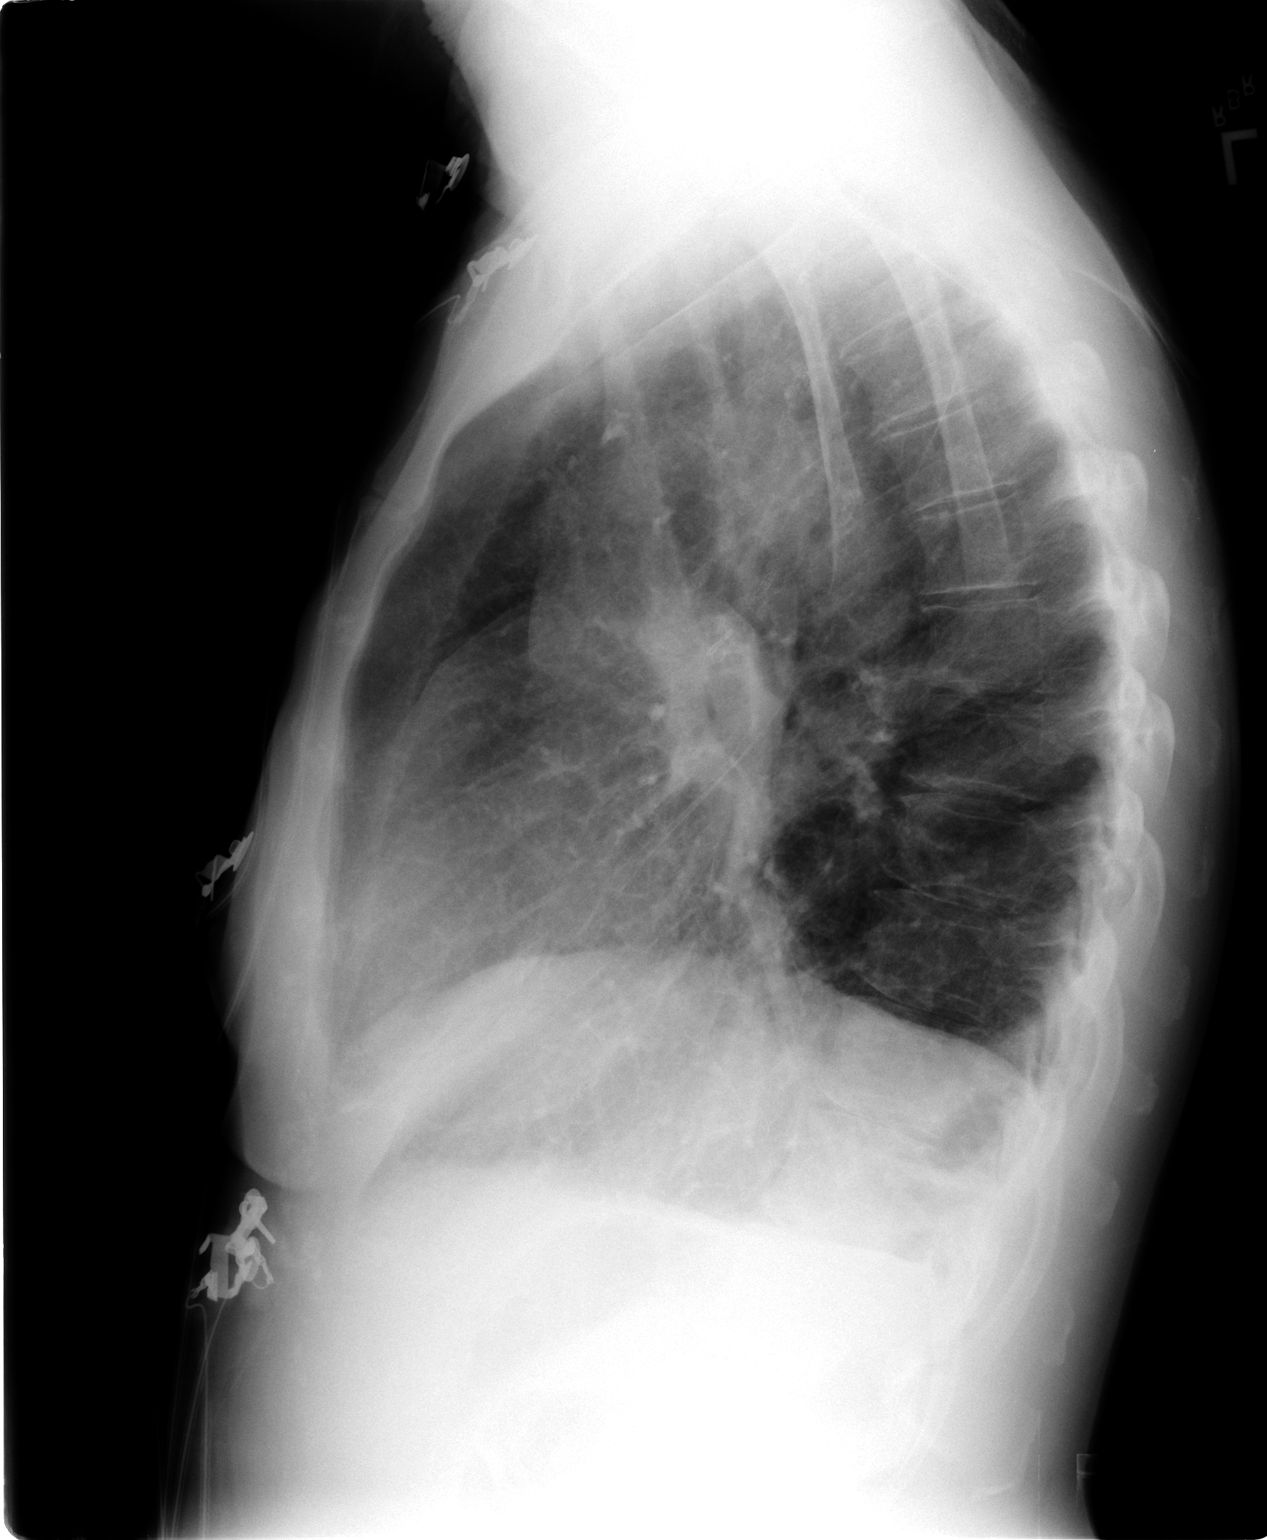

[2 of 2 positions shown; findings below may reference images not displayed]

CHEST 2 VIEWS:

Comparison 06/29/2006

Normal heart size, mediastinal contours, pulmonary vascularity.
Volume loss right hemithorax stable with mild elevation right diaphragm.
Small right pleural effusion.
No pneumothorax.
Underlying COPD with minimal left basilar atelectasis and tiny left effusion. 
Remainder of left lung clear.
IMPRESSION: Stable postsurgical changes right hemithorax.
Tiny bibasilar effusions.
No acute abnormality.

## 2007-11-25 IMAGING — CR DG CHEST 2V
2 series · 2 of 2 positions shown · non-contrast
Comparison: 07/11/06

CLINICAL DATA: Right lung surgery June 2006.  Follow-up.
 CHEST - 2 VIEW:

[w chest pa]
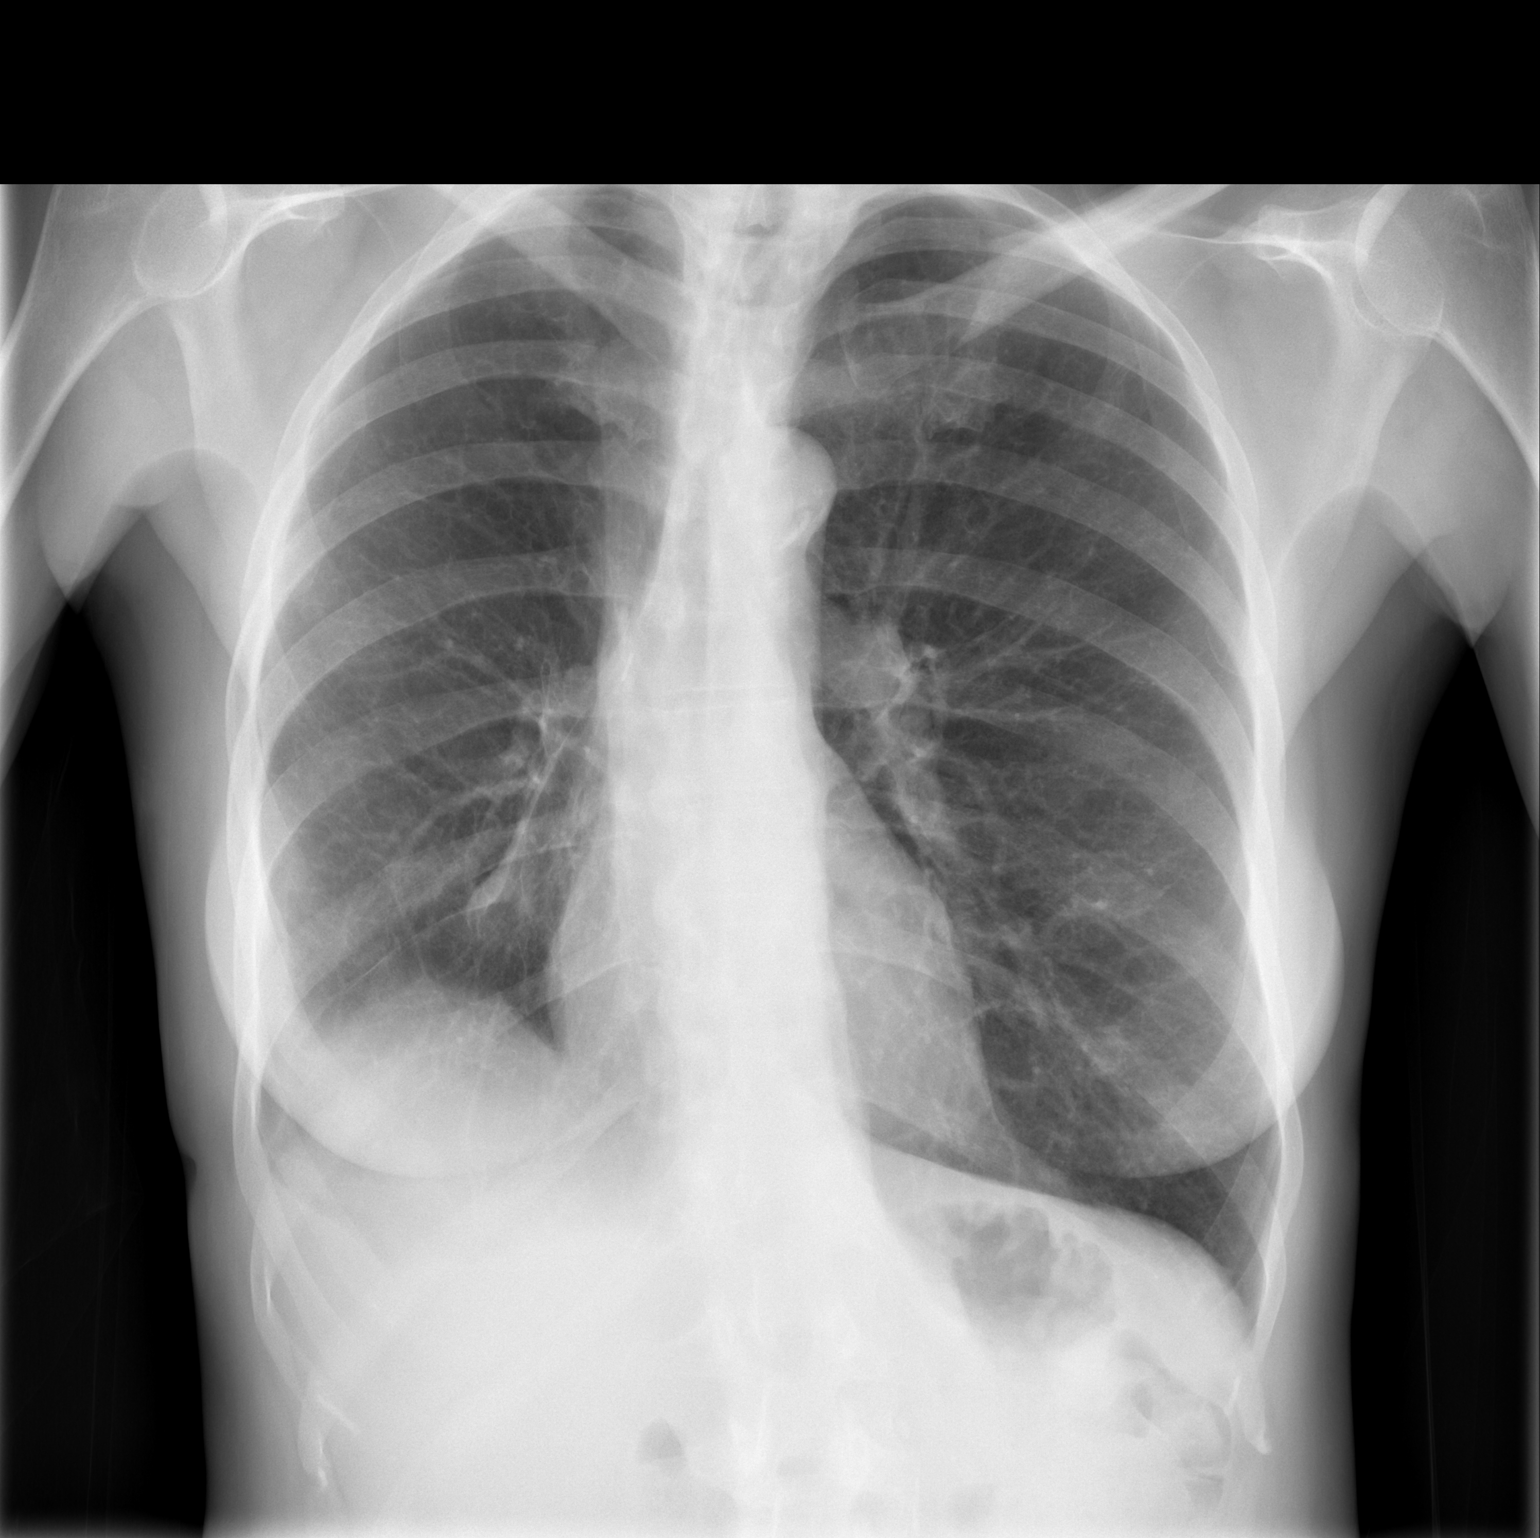

[w chest lat]
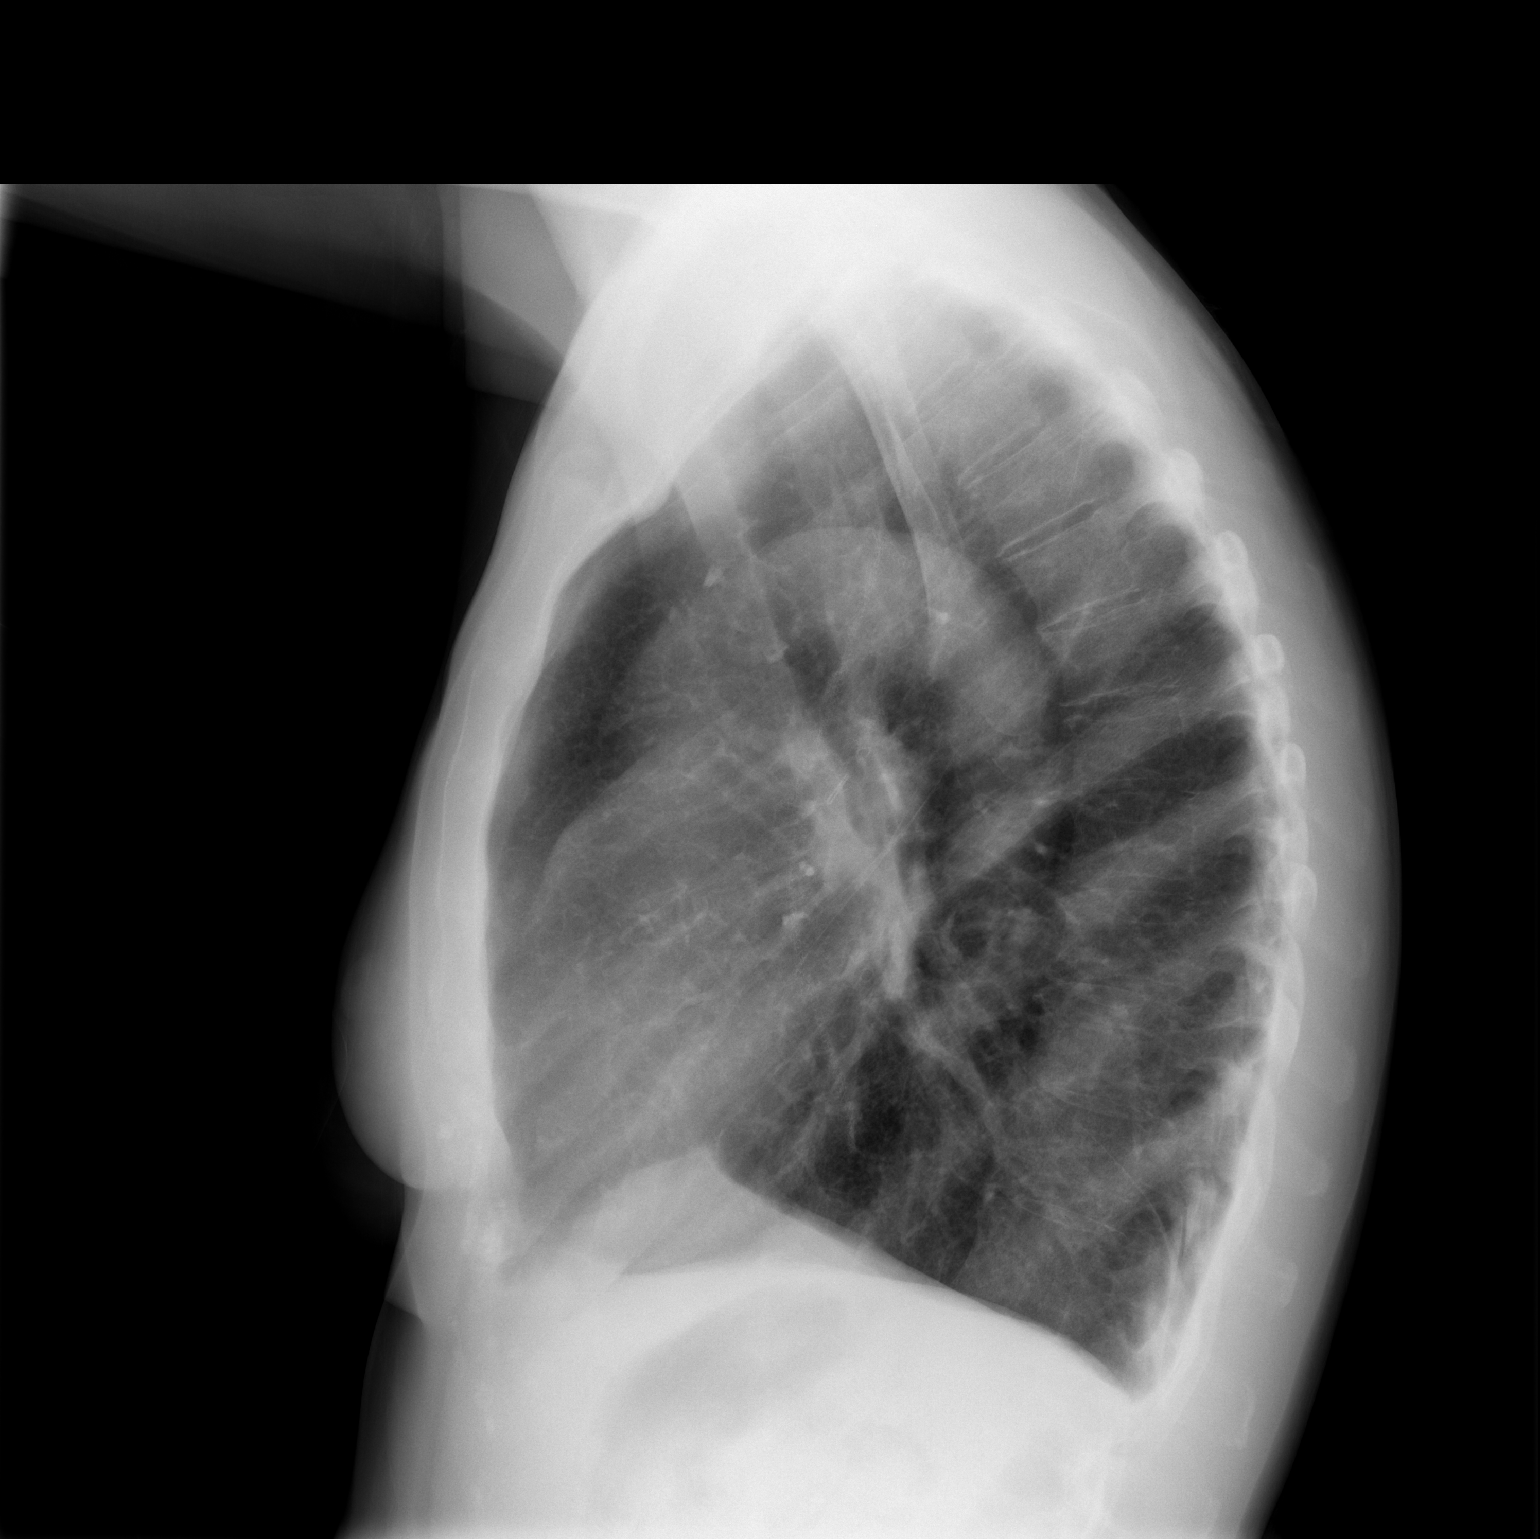

[2 of 2 positions shown; findings below may reference images not displayed]

FINDINGS: Two views of the chest show slightly better aeration of the right lung base with resolution of the small right effusion.  The lungs remain hyperaerated.  The heart is within normal limits in size.
IMPRESSION: Resolution of a small right effusion with better aeration of the right lung base.

## 2007-12-11 ENCOUNTER — Ambulatory Visit: Payer: Self-pay | Admitting: Thoracic Surgery

## 2007-12-11 ENCOUNTER — Encounter: Admission: RE | Admit: 2007-12-11 | Discharge: 2007-12-11 | Payer: Self-pay | Admitting: Thoracic Surgery

## 2008-01-06 IMAGING — CR DG CHEST 2V
2 series · 2 of 2 positions shown · non-contrast
Comparison: 08/08/2006

CLINICAL DATA: Right lung surgery in Wednesday June, 2006.

[view not recorded (1 of 2)]
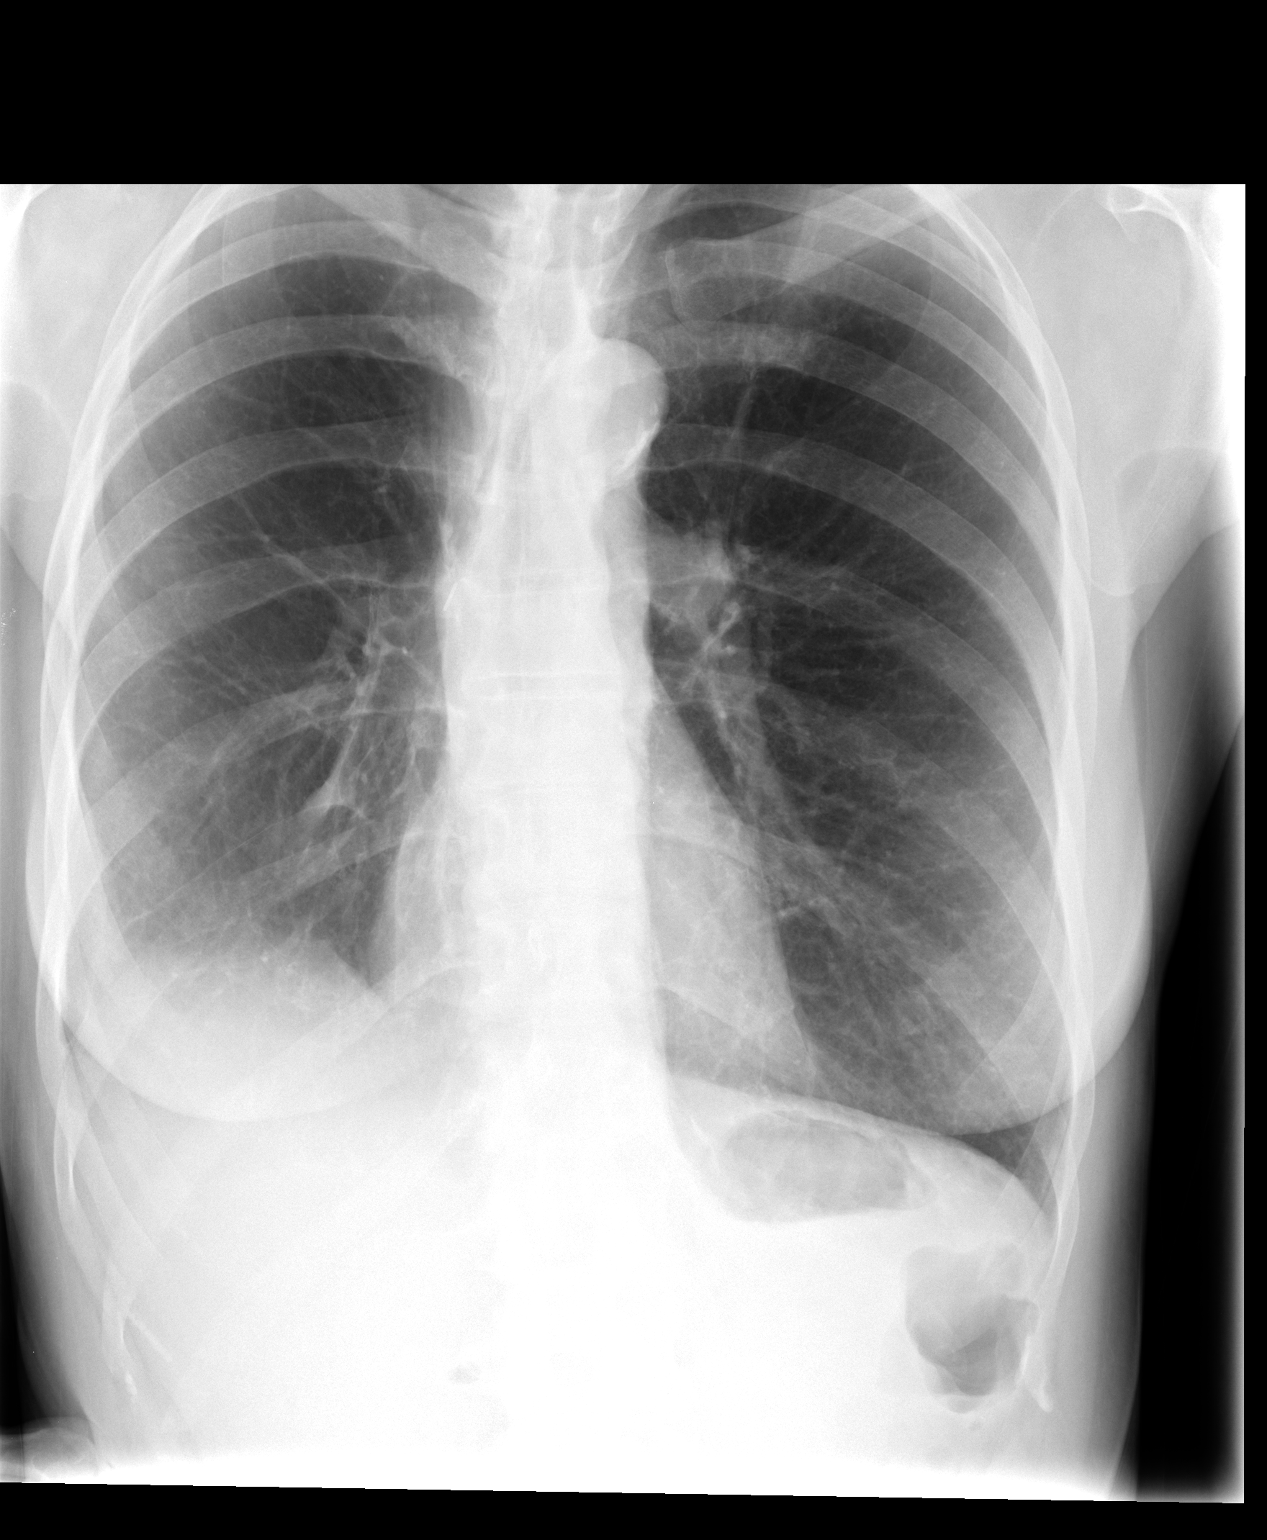

[view not recorded (2 of 2)]
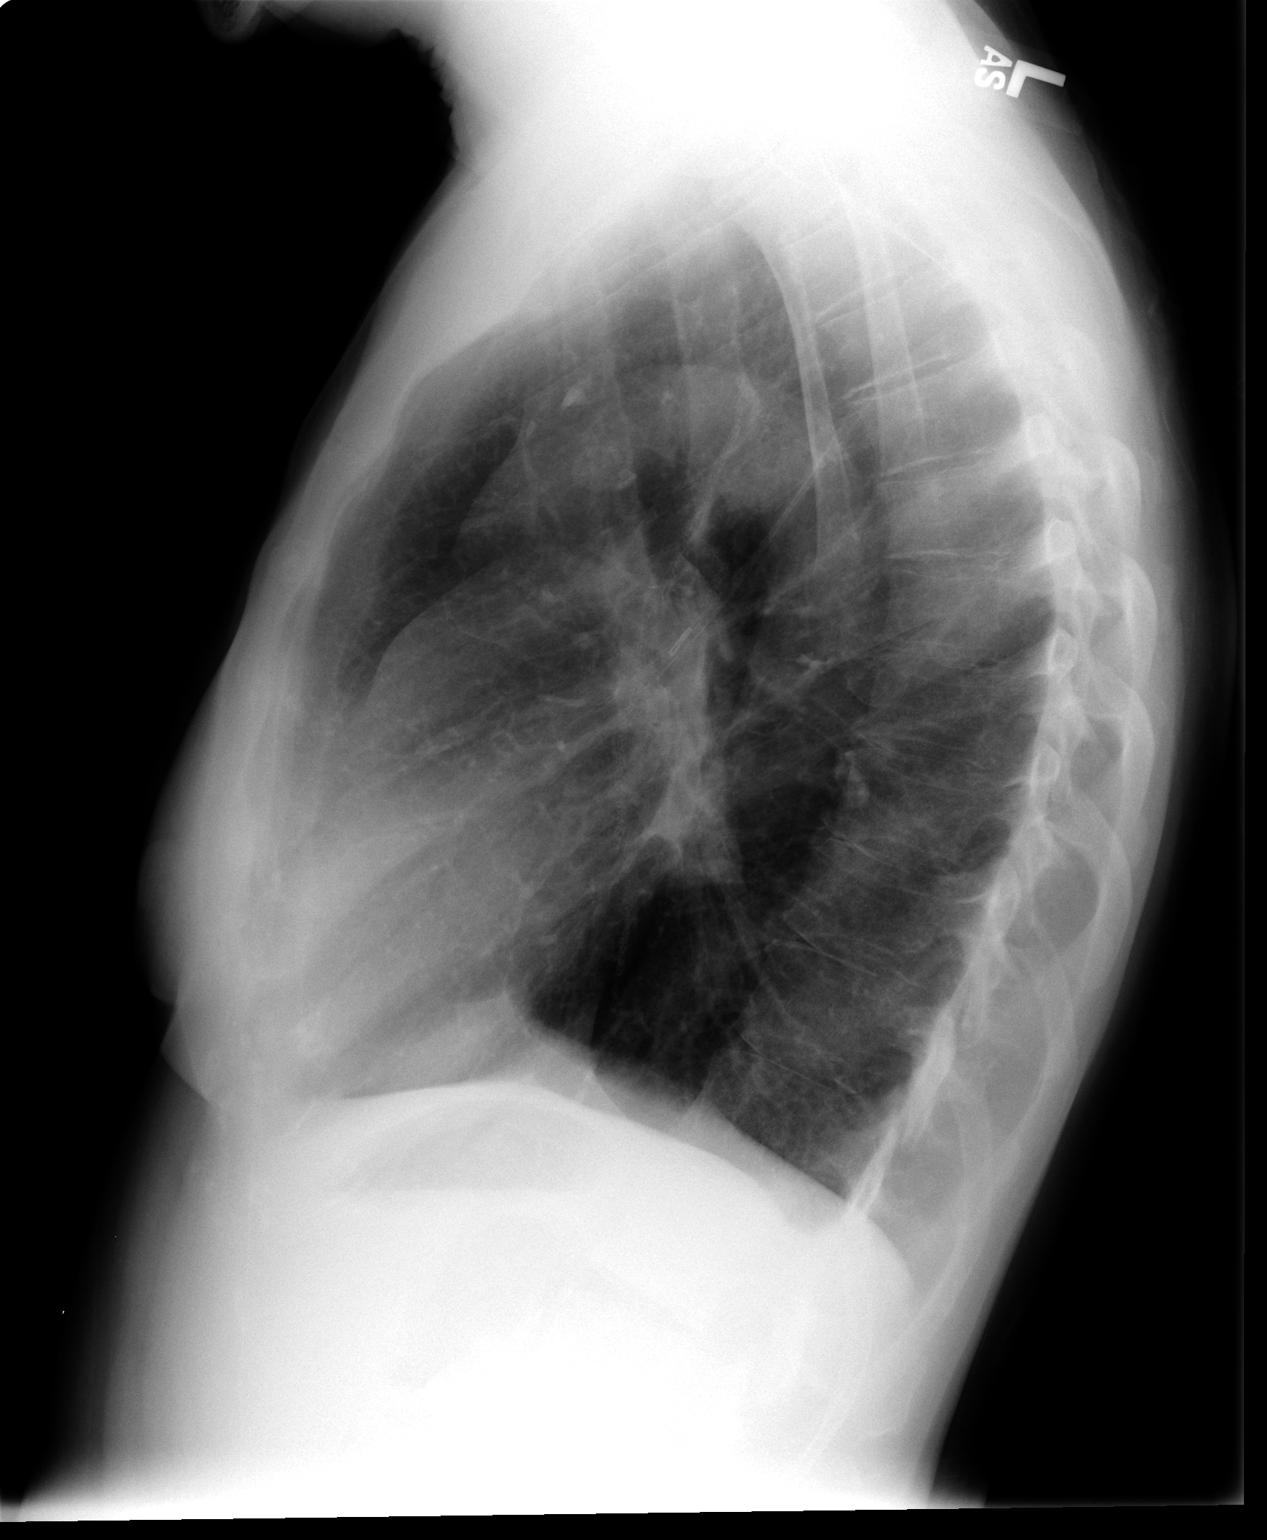

[2 of 2 positions shown; findings below may reference images not displayed]

CHEST - 2 VIEW:

Volume loss in the right hemithorax is stable. There is some scarring at the
right lung base without evidence for pleural effusion. Imaging features are
stable in the interval. Left lung is mildly hyperexpanded but clear. Heart size
is normal.
IMPRESSION: Stable exam. No new or acute interval findings.

## 2008-01-13 IMAGING — CR DG CHEST 2V
2 series · 2 of 2 positions shown · non-contrast
Comparison: 09/19/2006

CLINICAL DATA: Fever.  Postop day one from bilateral oophorectomy. History of
lung cancer resection on 06/21/2006.

[w chest pa]
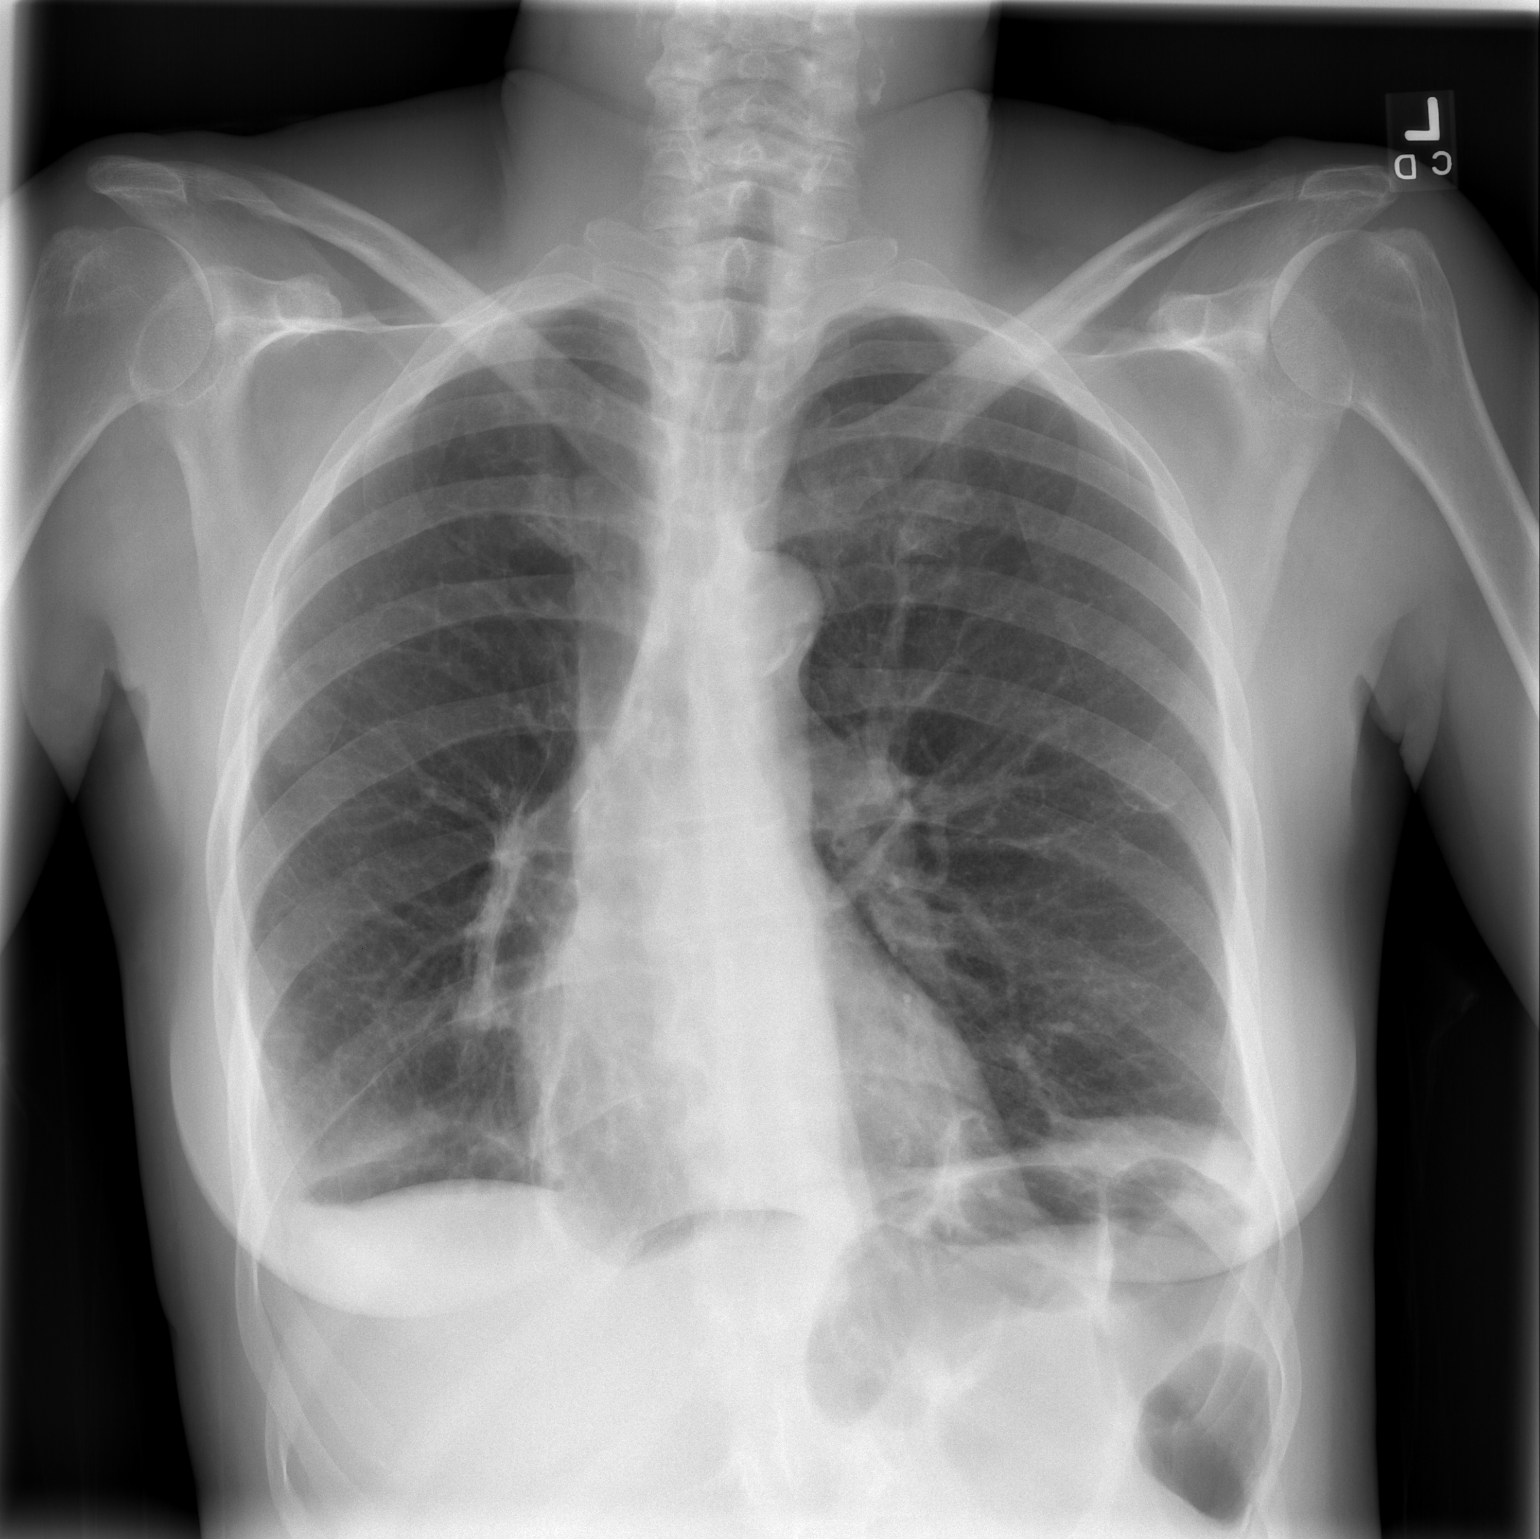

[w chest lat]
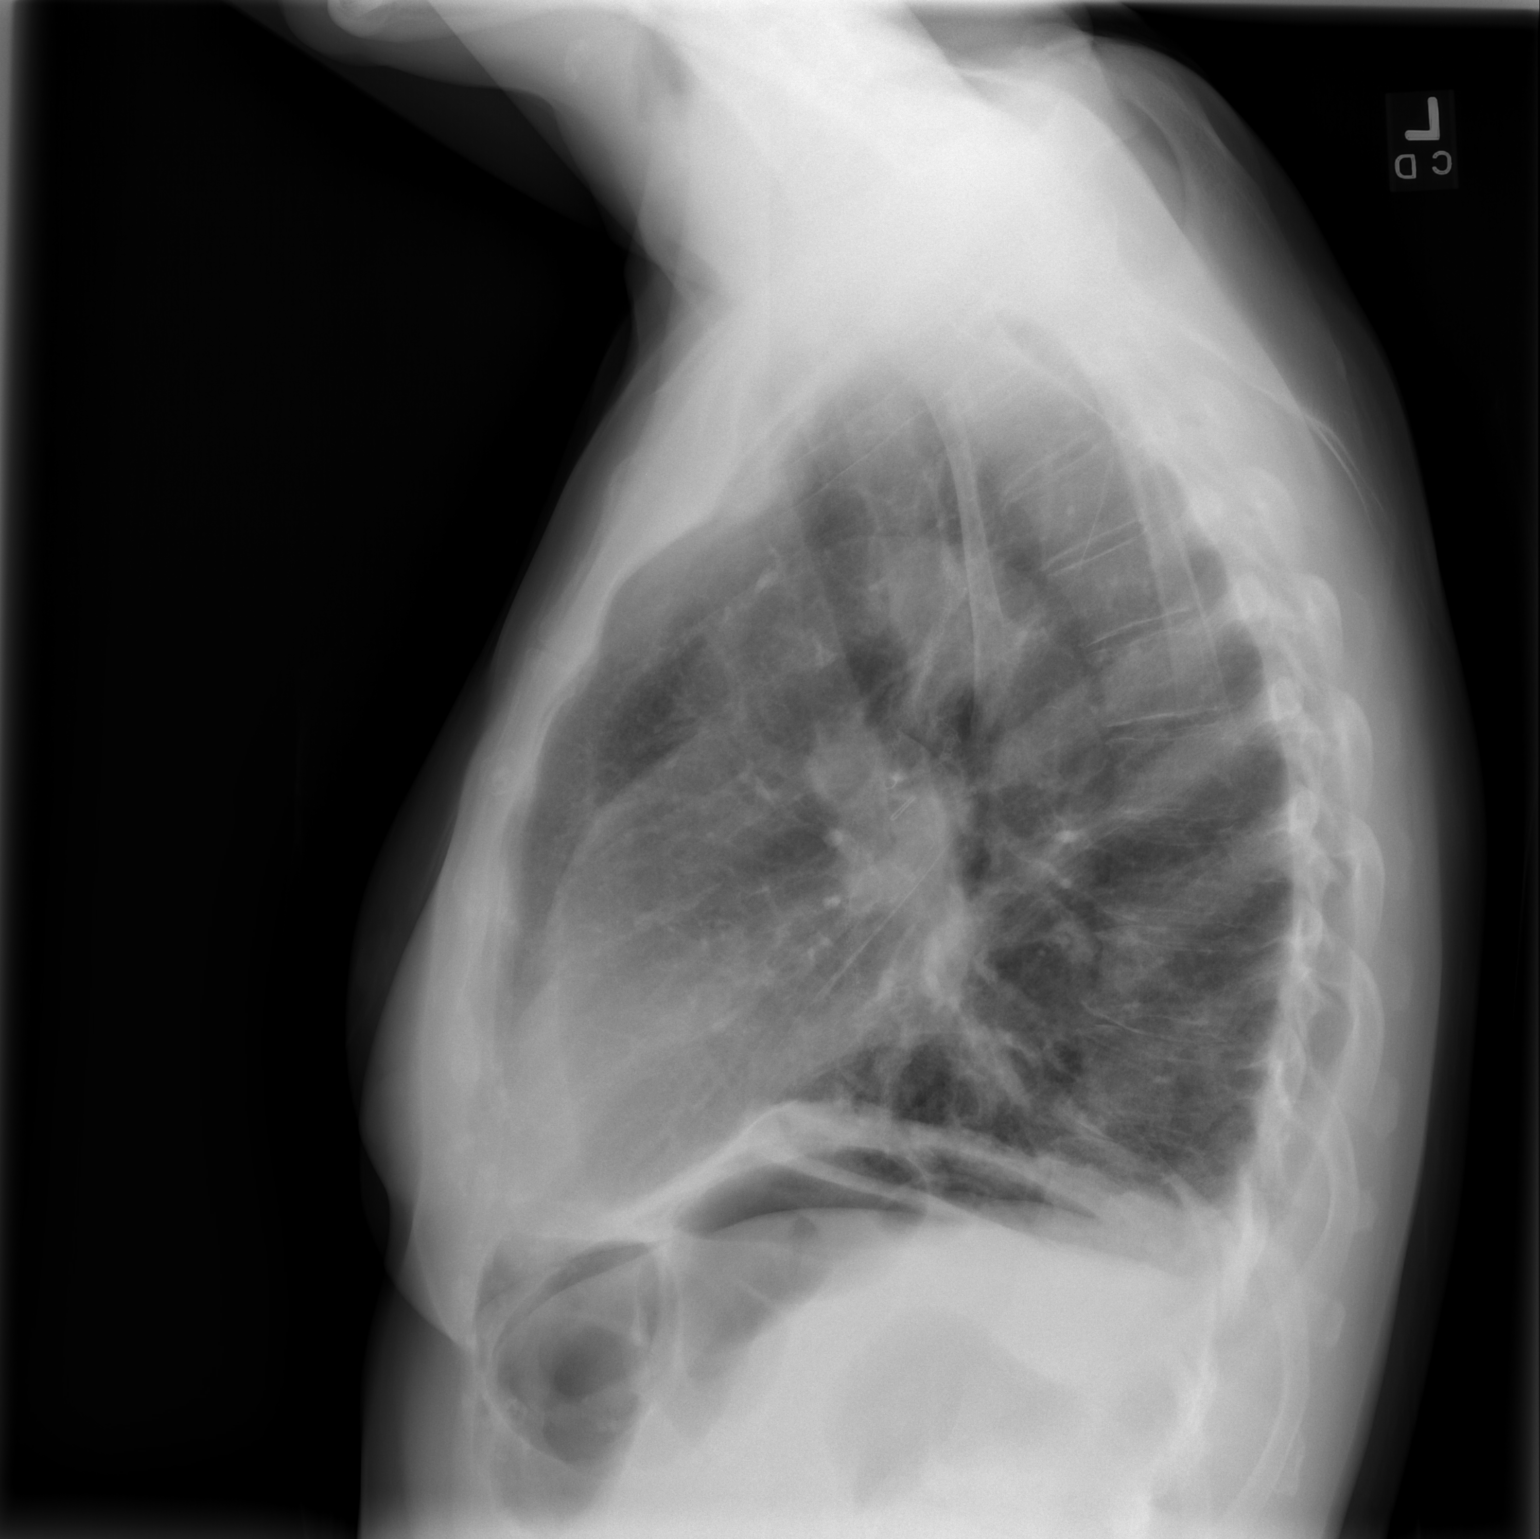

[2 of 2 positions shown; findings below may reference images not displayed]

CHEST - 2 VIEW:

Two view chest shows intraperitoneal free air in this patient one day from
oophorectomy. There is chronic atelectasis or scarring at the right lung base,
not substantially changed in the interval. Linear opacity in the right
infrahilar region is associated with clips in the right hilum. The infrahilar
changes are presumably related to scarring. There is some atelectasis in the
medial left lung base.

No evidence for focal air space consolidation to suggest pneumonia. The
cardiopericardial silhouette is within normal limits for size. Imaged bony
structures of the thorax are intact.
IMPRESSION: Intraperitoneal free air. By report, this patient is one day status post
oophorectomy. Intraperitoneal free air is not unexpected in this setting.

Persistent hazy opacity the right lung base is unchanged from the exam of one
week ago. This is probably post treatment chronic atelectasis or scarring with
some linear scarring in the right infrahilar region as well.

No pulmonary edema or focal airspace consolidation.

## 2008-03-08 IMAGING — CR DG CHEST 2V
2 series · 2 of 2 positions shown · non-contrast
Comparison: 09/26/06.

CLINICAL DATA: Follow up lung carcinoma.  
 CHEST ? TWO VIEW:

[w chest pa]
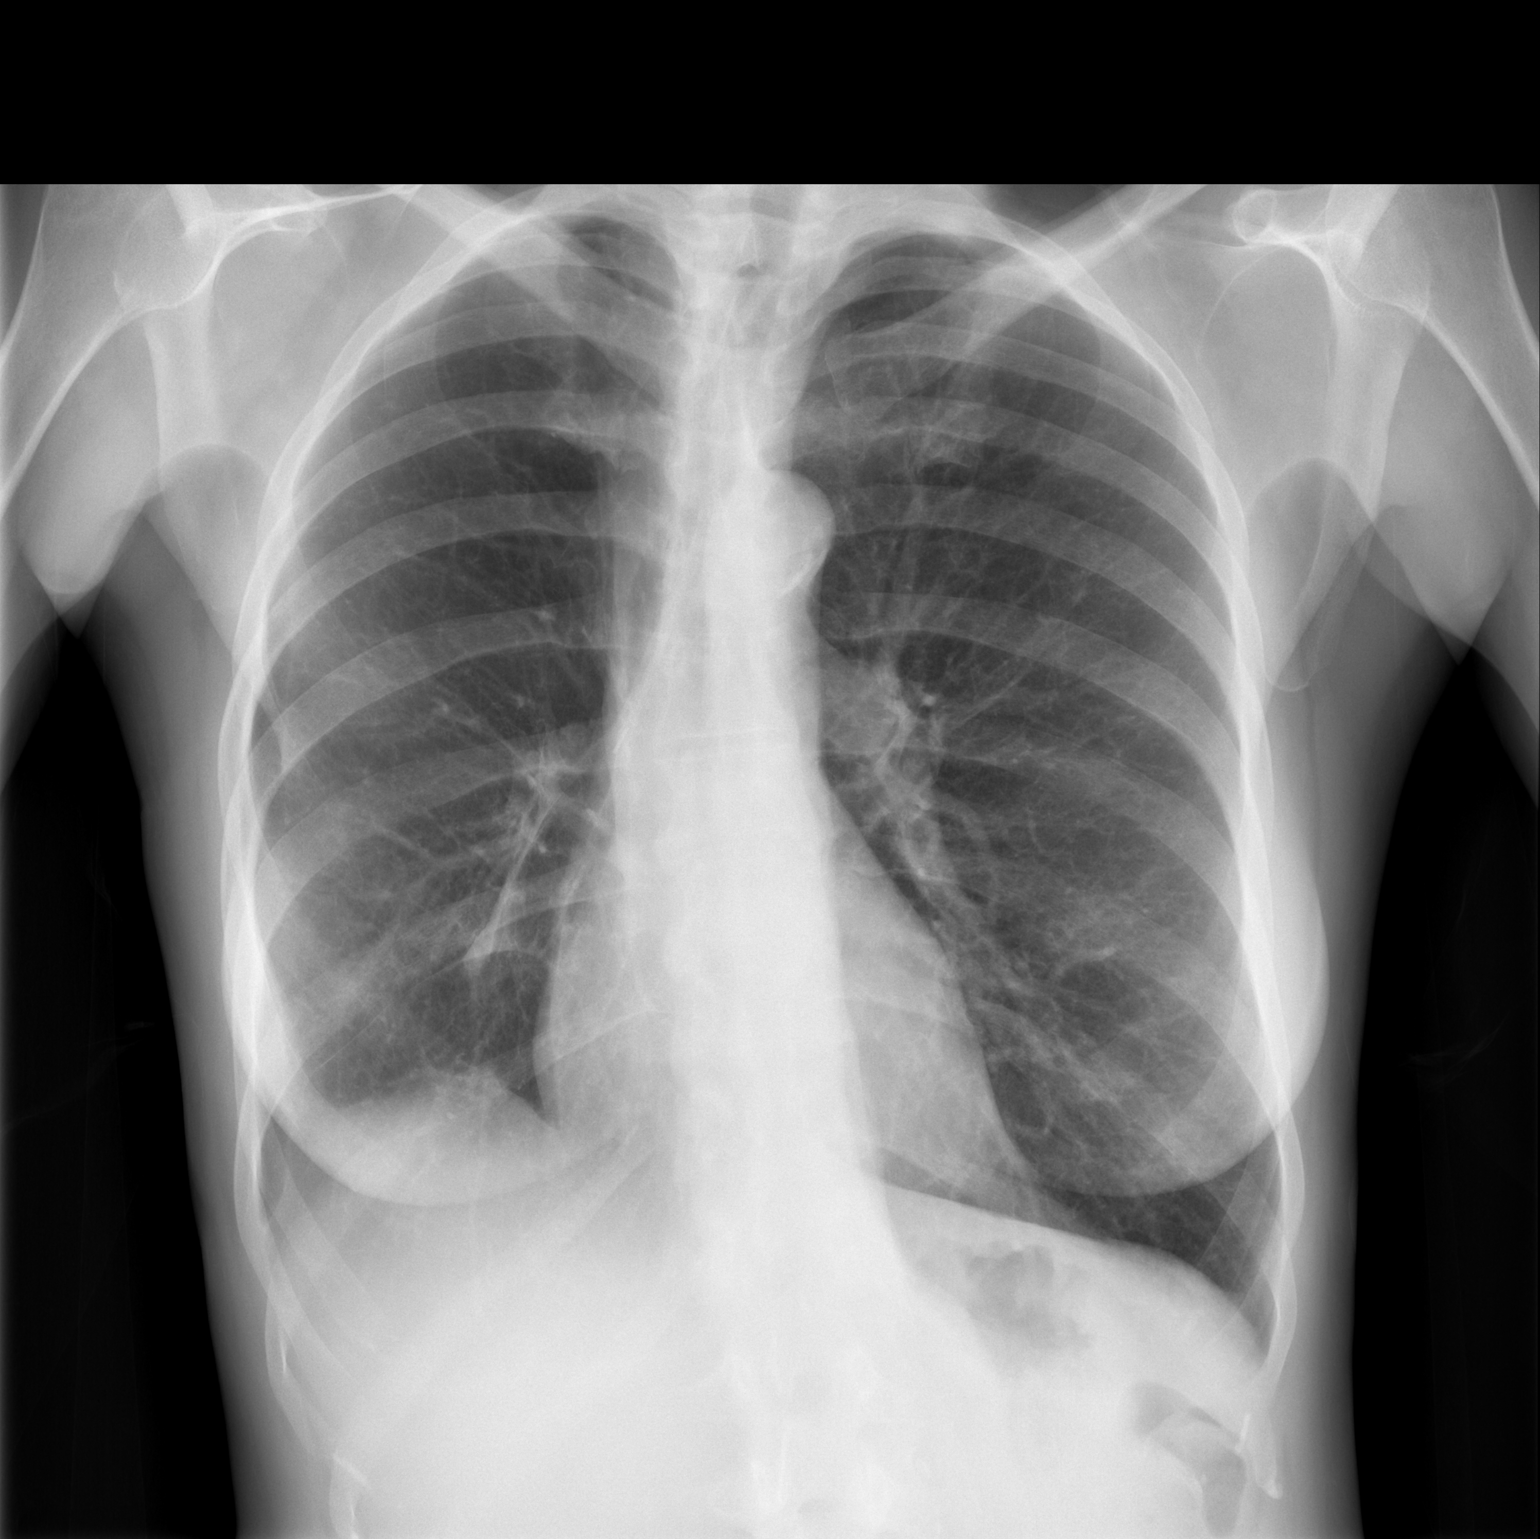

[w chest lat]
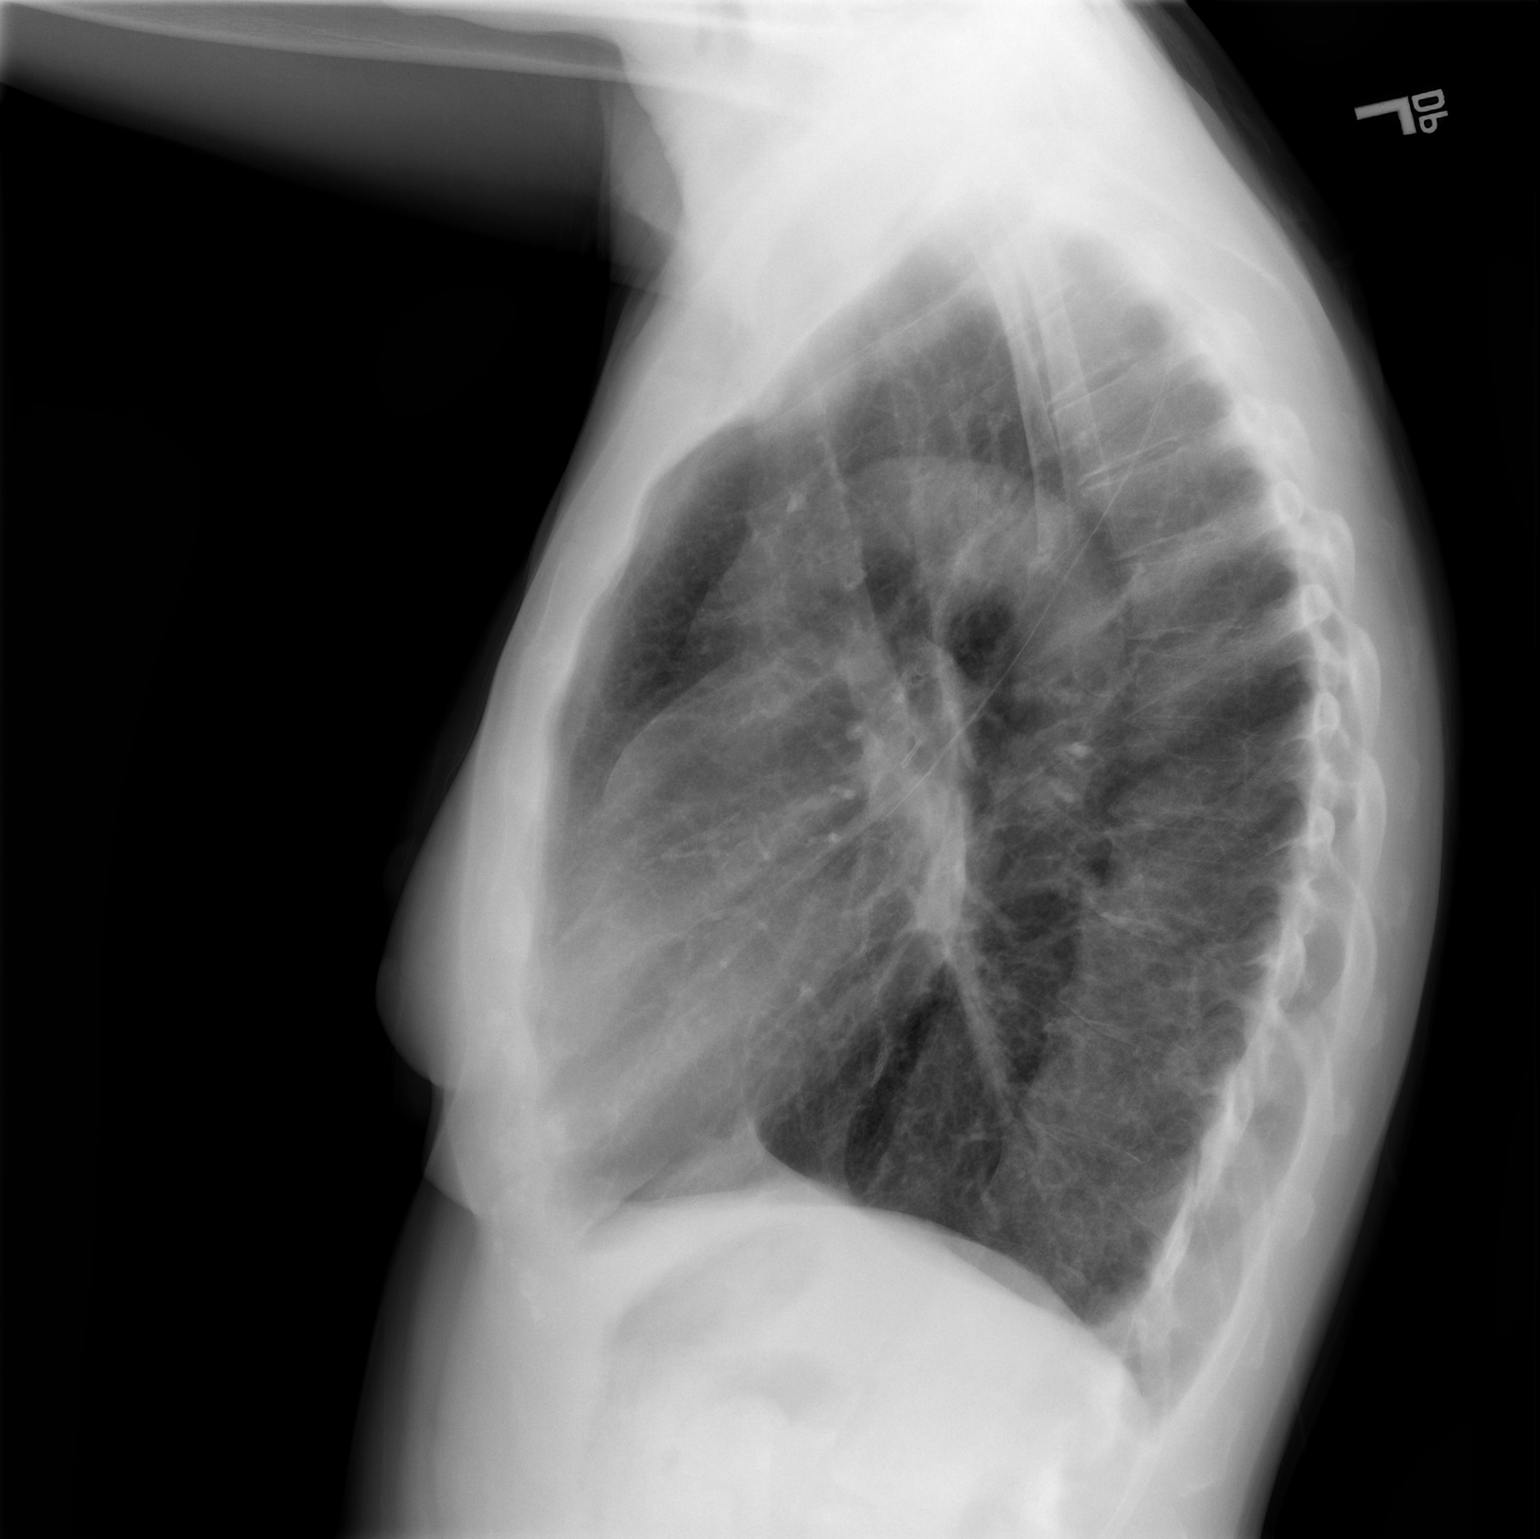

[2 of 2 positions shown; findings below may reference images not displayed]

FINDINGS: Pleural ? parenchymal scarring is again noted in the right lung base and has not significantly changed.  There is no evidence of pneumothorax or pleural effusion.  There is no evidence of mass or infiltrate.  Heart size and mediastinal contours are stable and within normal limits.
IMPRESSION: Right lower lung pleural ? parenchymal scarring.  No active disease.

## 2008-06-07 IMAGING — CT CT CHEST W/O CM
2 of 4 series · 15 of 36 positions shown, 18 images · IV contrast (agent unspecified)
Comparison: CT chest of 05/04/06.

CLINICAL DATA: History of lung   carcinoma.  Surgery on right lung in Wednesday June, 2006; follow-up.
 CHEST CT WITHOUT CONTRAST:
TECHNIQUE: Multidetector CT imaging of the chest was performed following the standard protocol without IV contrast.

[Series 3: routine chest · axial · 0.64mm/px · z∈[-294,+1]mm · 12 of 69 slices shown, 15 images]
[im 5/69  mediastinal]
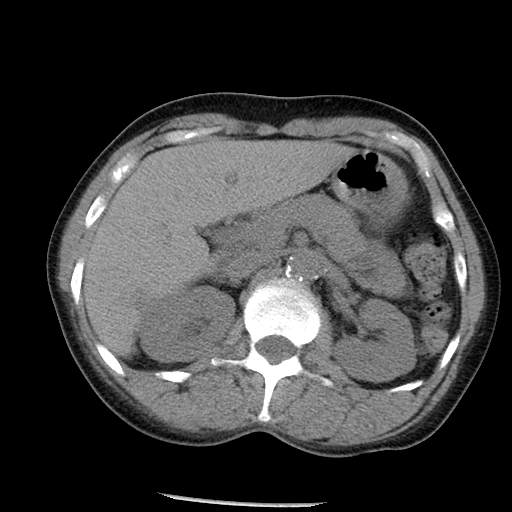
[im 5/69  lung]
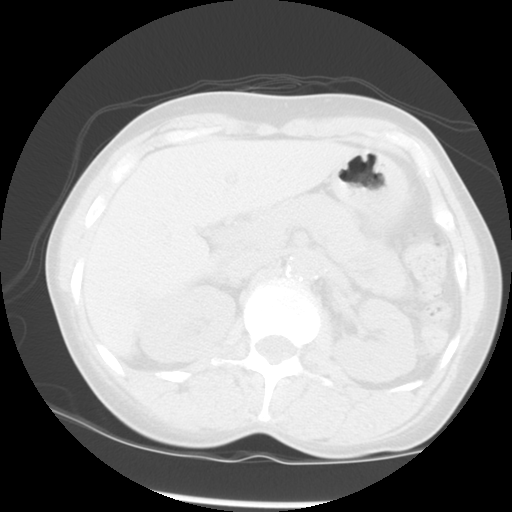
[im 10/69  lung]
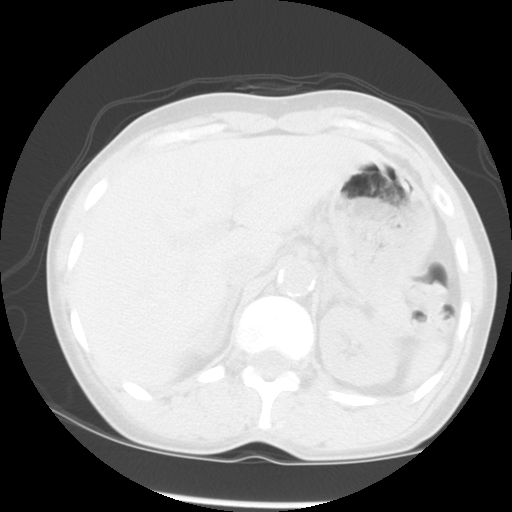
[im 15/69  lung]
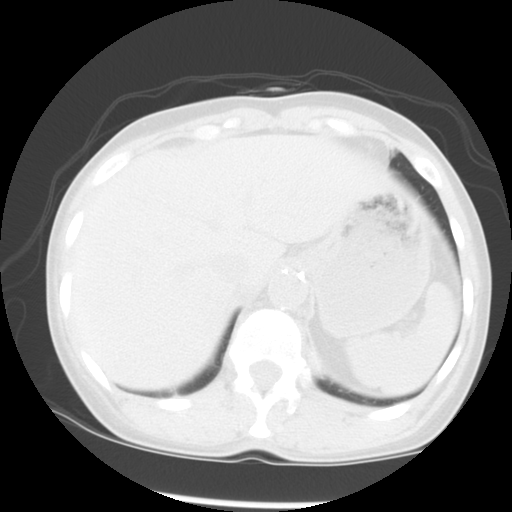
[im 20/69  lung]
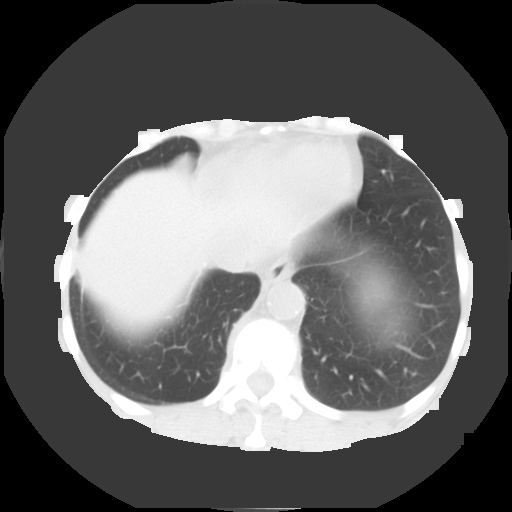
[im 25/69  mediastinal]
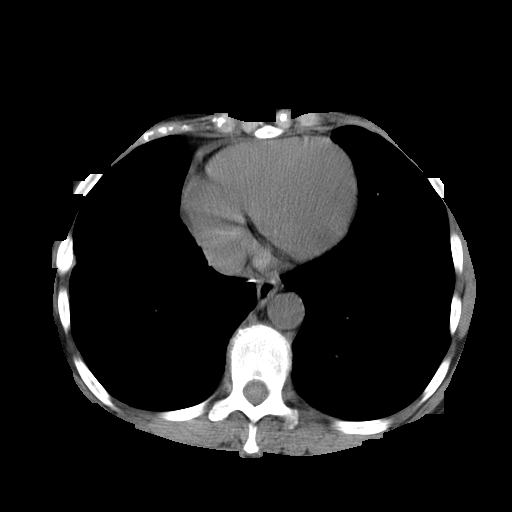
[im 25/69  lung]
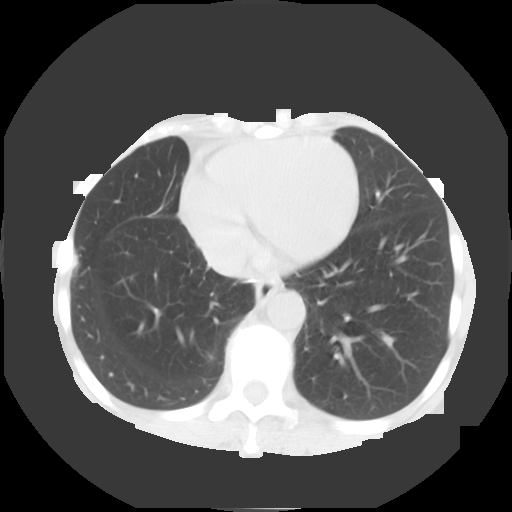
[im 30/69  lung]
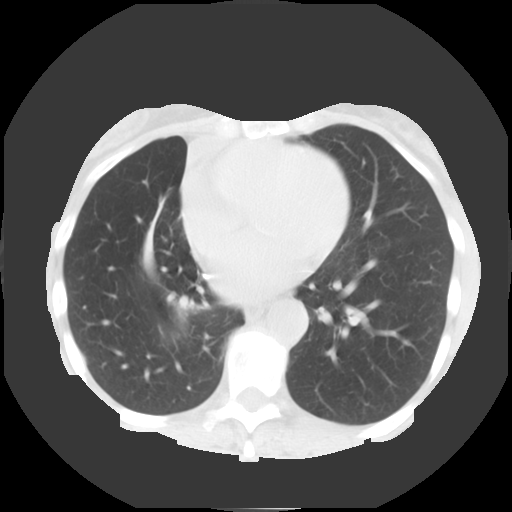
[im 39/69  lung]
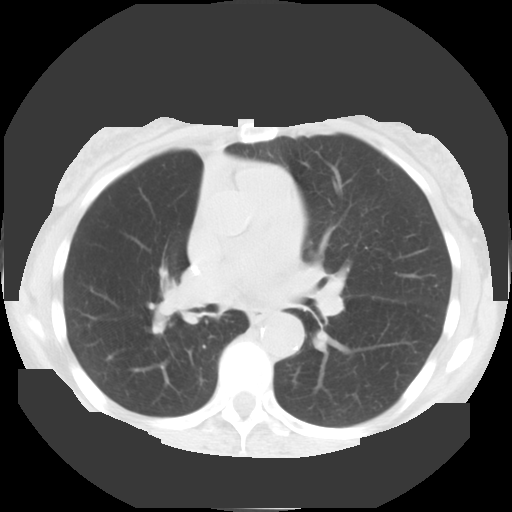
[im 44/69  lung]
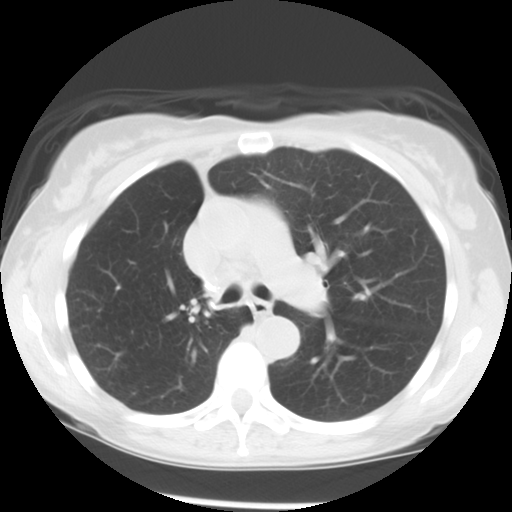
[im 49/69  mediastinal]
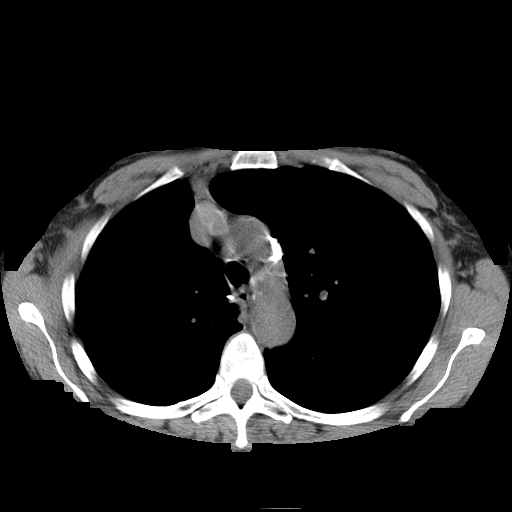
[im 49/69  lung]
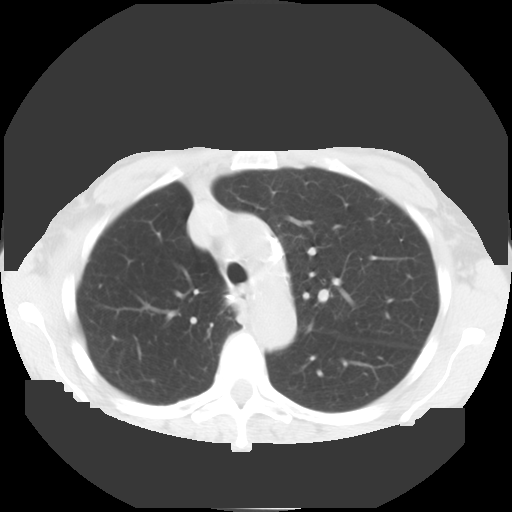
[im 54/69  lung]
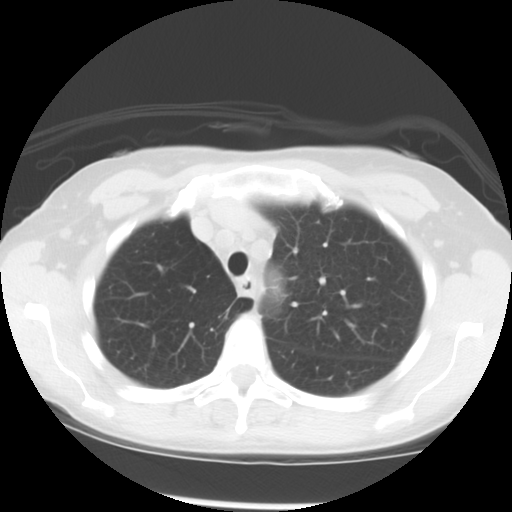
[im 59/69  lung]
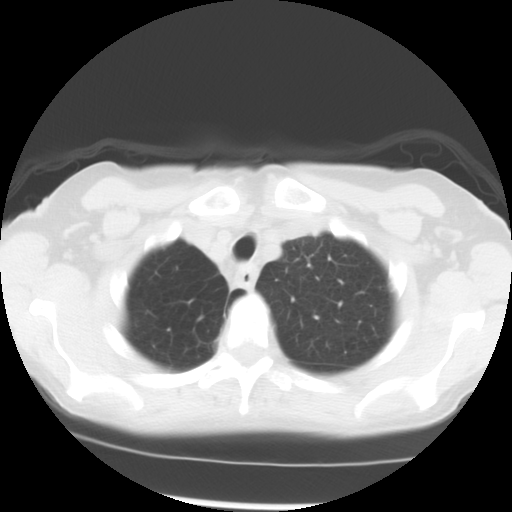
[im 64/69  lung]
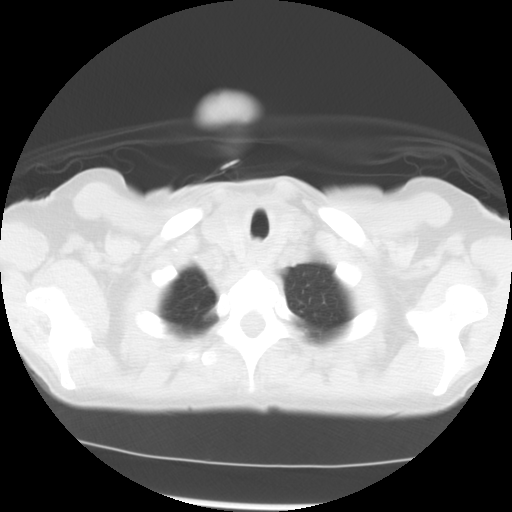

[Series 602: sagittal body · sagittal · 0.66mm/px · 3 of 133 slices shown]
[im 27/133  lung]
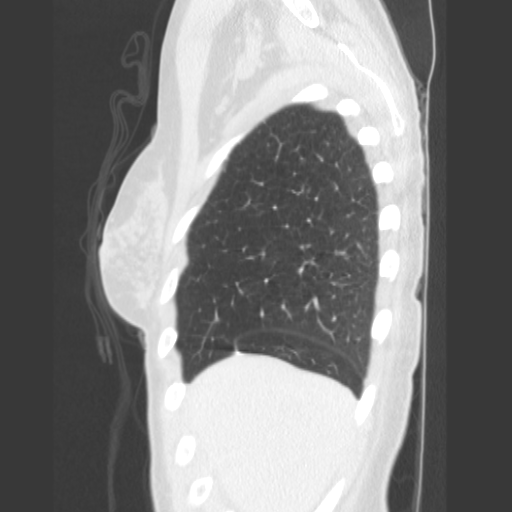
[im 53/133  lung]
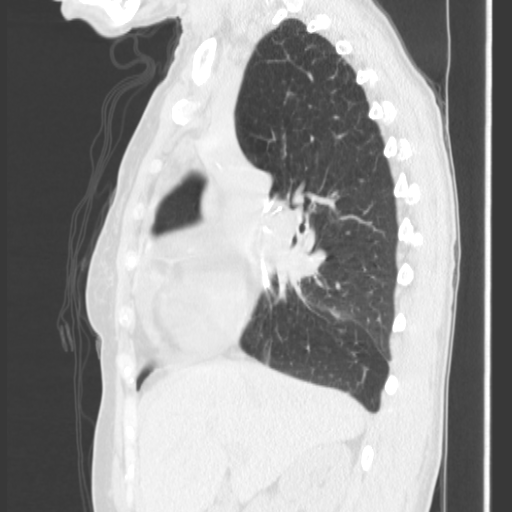
[im 80/133  lung]
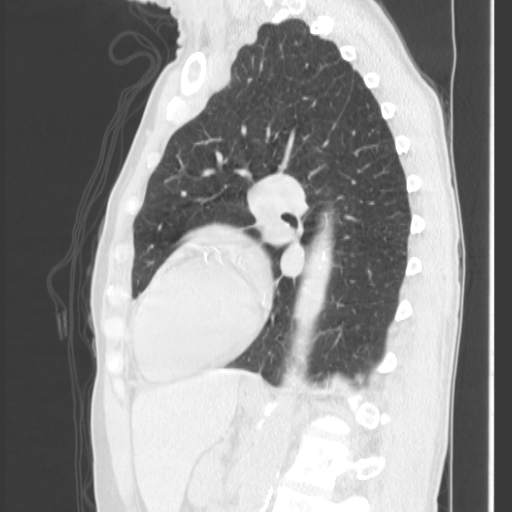

[15 of 36 positions shown; findings below may reference images not displayed]

FINDINGS: The spiculated right upper lobe lesion has been resected.  Changes of COPD are noted.  There is a   vague opacity within the medial left upper lobe which was present previously and may represent scarring.  Continued follow-up is recommended.  No other suspicious lung lesion is seen and no effusion is noted.  Scarring is present peripherally at the right lung base.  No mediastinal or hilar adenopathy is seen.  Coronary artery calcifications are noted.  Small pleural effusion or pericardial thickening is again noted and is unchanged.
IMPRESSION: 1.  No evidence of metastatic disease or recurrence. 
 2.  Vague opacity in medial left upper lobe is stable and may represent an area of scarring, but continued follow-up is recommended.

## 2008-06-10 ENCOUNTER — Emergency Department (HOSPITAL_COMMUNITY): Admission: EM | Admit: 2008-06-10 | Discharge: 2008-06-10 | Payer: Self-pay | Admitting: Family Medicine

## 2008-06-10 ENCOUNTER — Encounter: Admission: RE | Admit: 2008-06-10 | Discharge: 2008-06-10 | Payer: Self-pay | Admitting: Thoracic Surgery

## 2008-06-10 ENCOUNTER — Ambulatory Visit: Payer: Self-pay | Admitting: Thoracic Surgery

## 2008-09-28 IMAGING — CR DG CHEST 2V
2 series · 2 of 2 positions shown · non-contrast
Comparison: 02/19/2007

CLINICAL DATA: Lung CA.  Surgery 06/21/2006.  Smoker.

CHEST - 2 VIEW

[w chest pa]
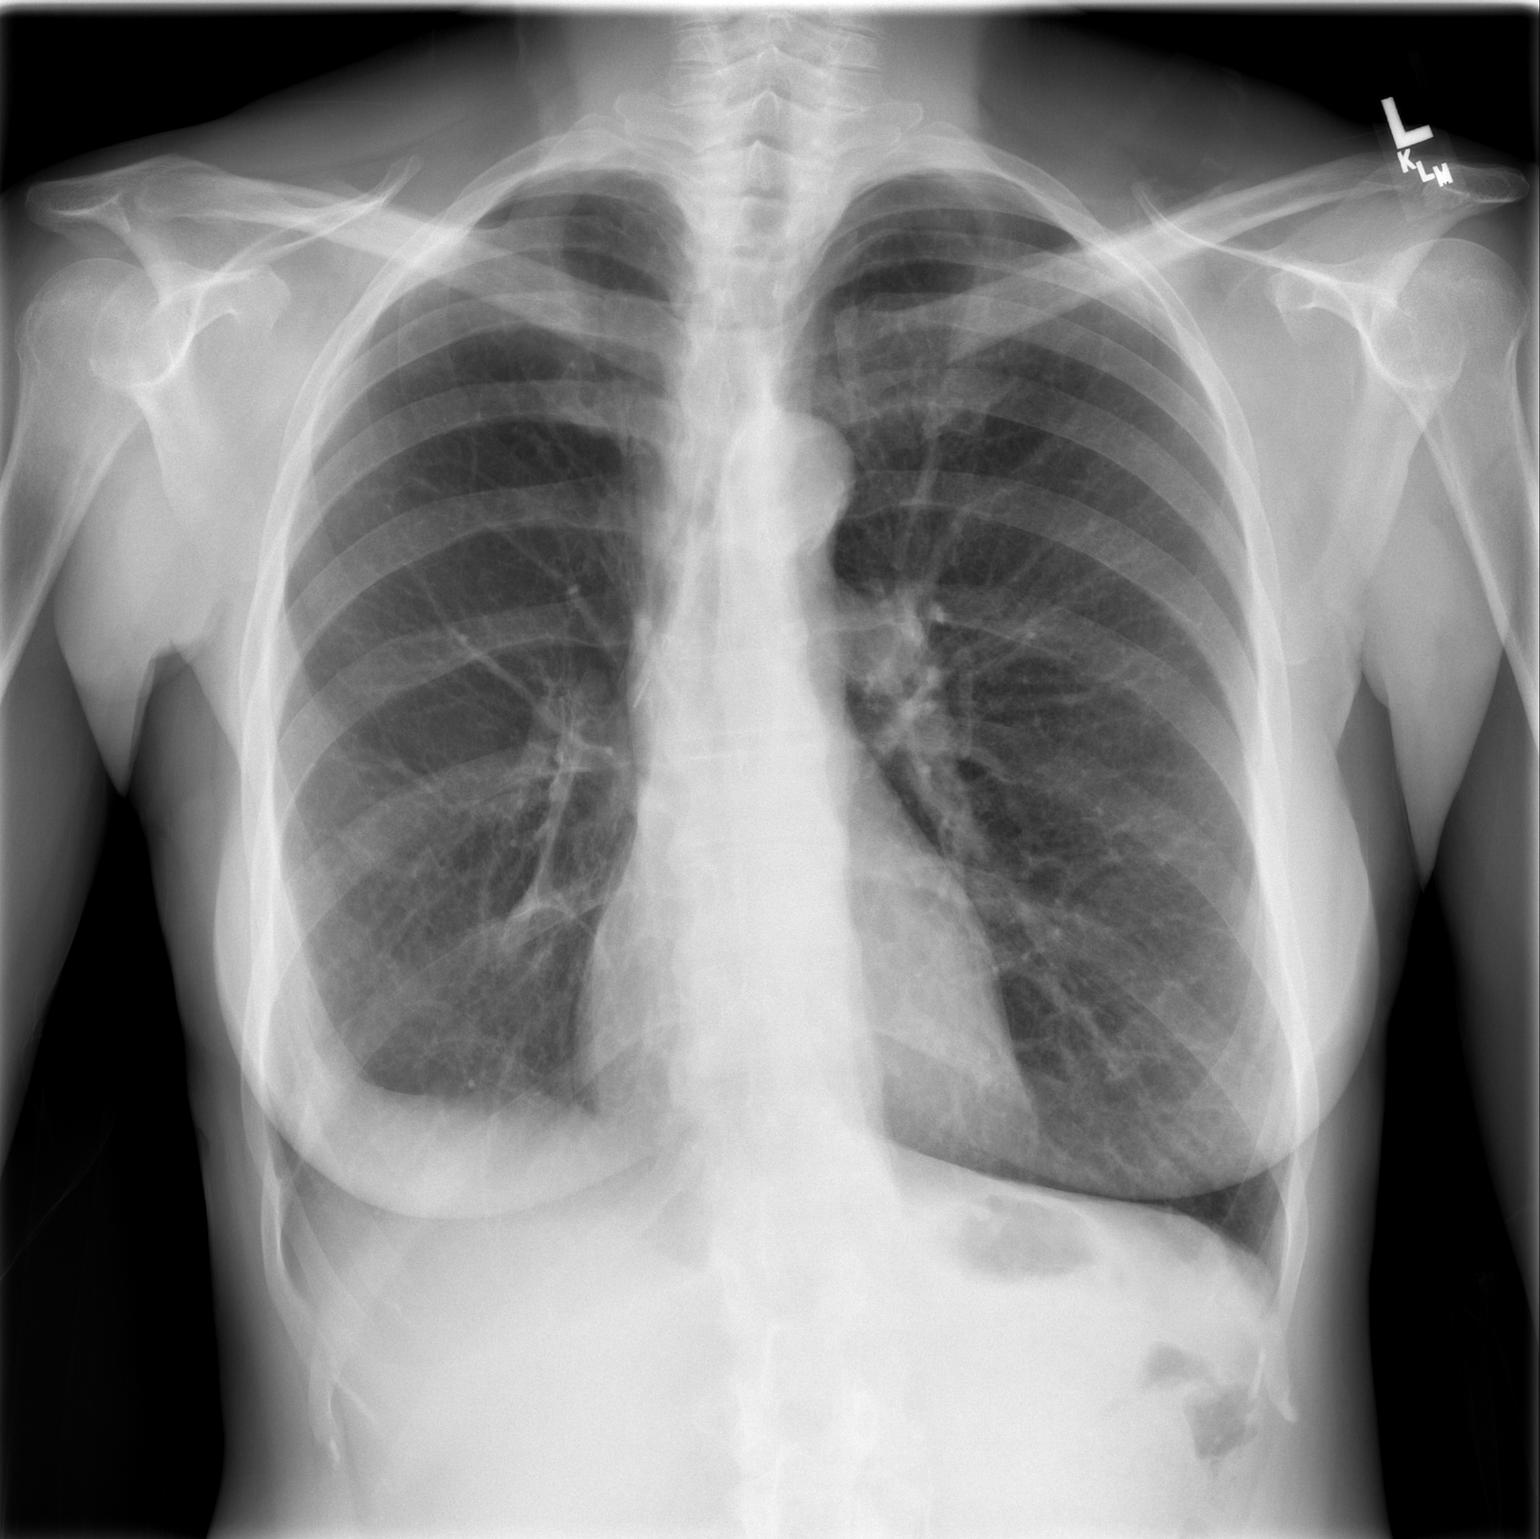

[w chest lat]
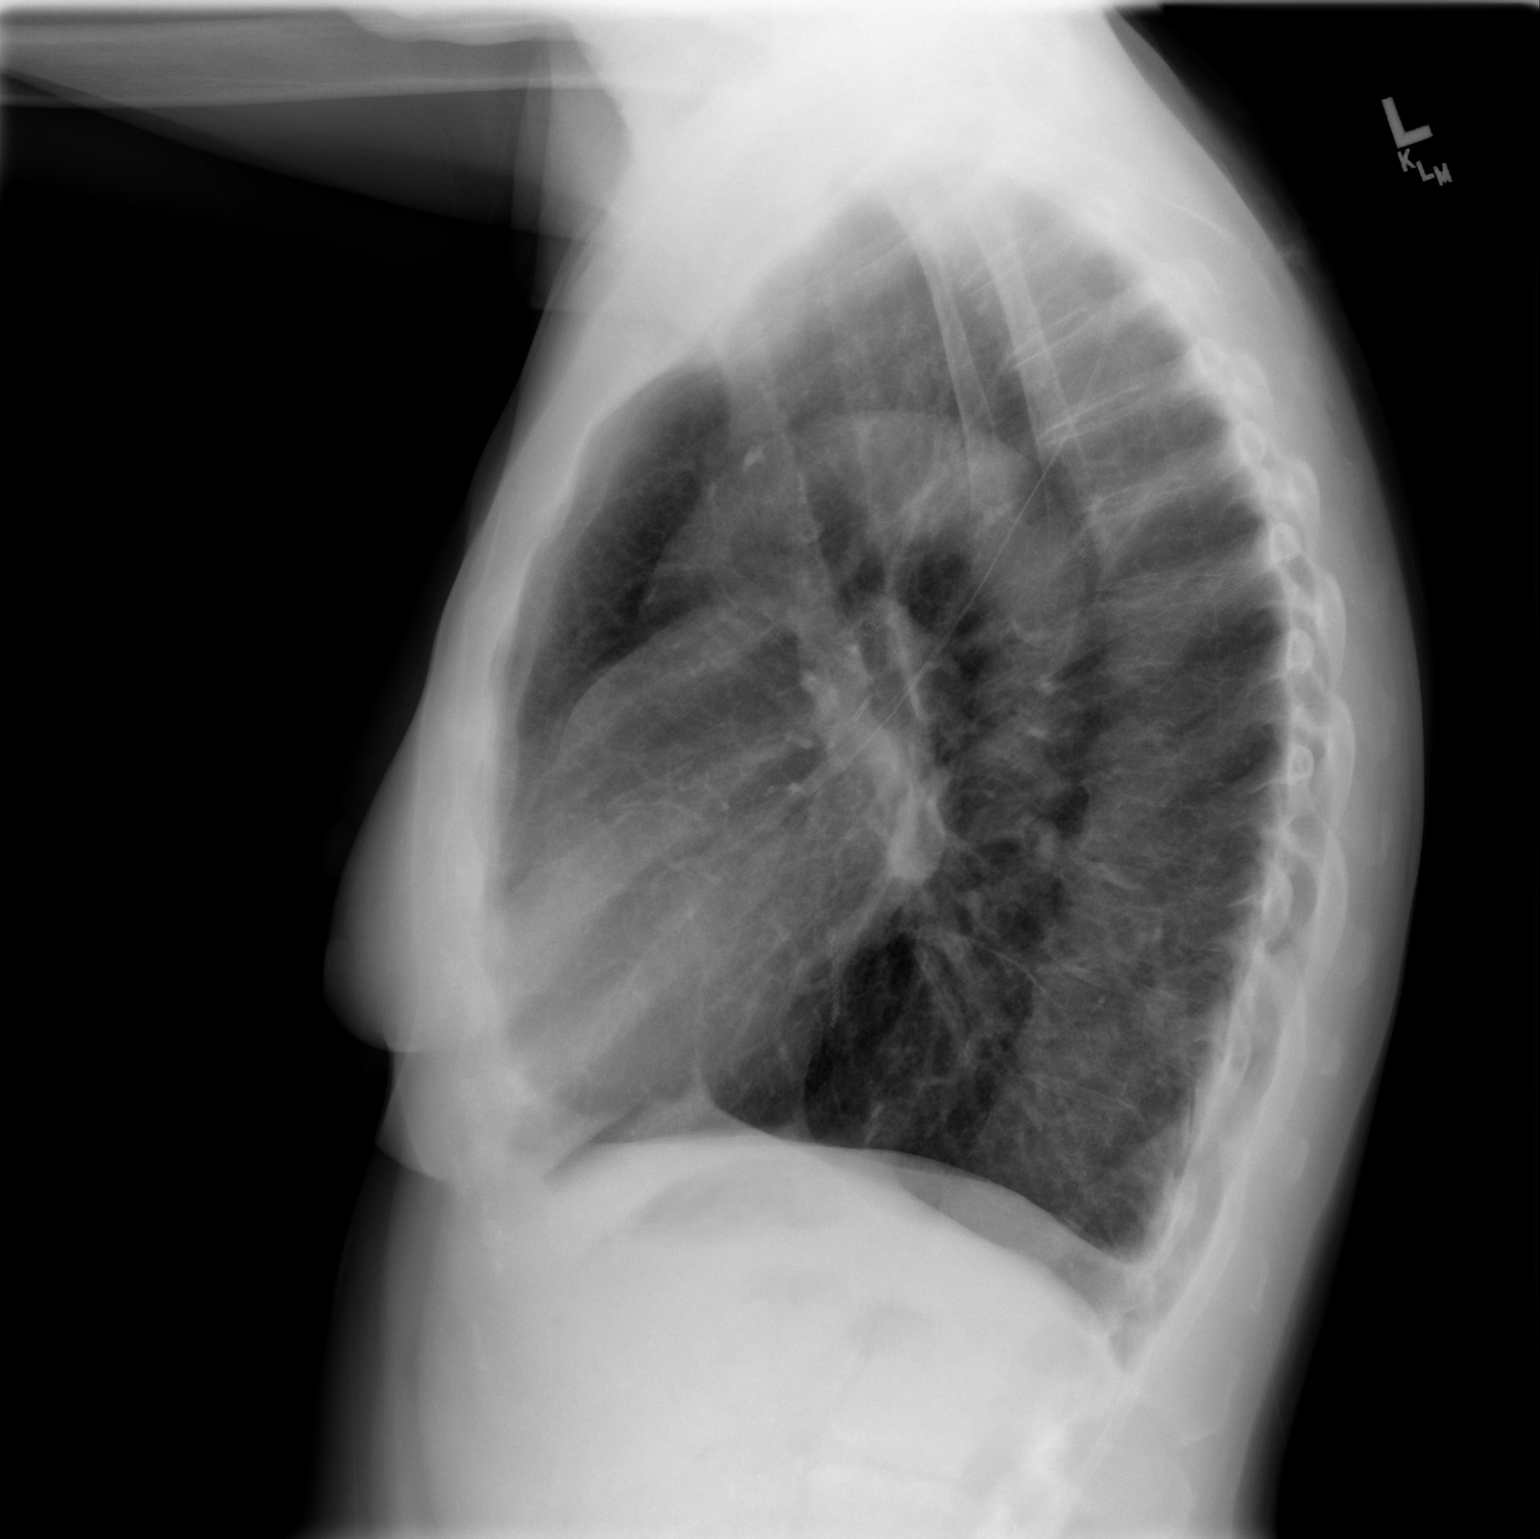

[2 of 2 positions shown; findings below may reference images not displayed]

FINDINGS: Chronic blunting of the right costophrenic angle.  No
active chest findings.  Normal cardiomediastinal silhouette.  No
hilar enlargement.  Intact bony thorax.
IMPRESSION: No active chest disease.  Stable blunting of right costophrenic
angle.

## 2008-12-15 ENCOUNTER — Encounter: Admission: RE | Admit: 2008-12-15 | Discharge: 2008-12-15 | Payer: Self-pay | Admitting: Thoracic Surgery

## 2008-12-15 ENCOUNTER — Ambulatory Visit: Payer: Self-pay | Admitting: Thoracic Surgery

## 2009-03-29 IMAGING — CT CT CHEST W/O CM
2 of 4 series · 15 of 36 positions shown, 18 images · non-contrast
Comparison: CT 02/19/2007

CLINICAL DATA: History of lung cancer.

CT CHEST WITHOUT CONTRAST
TECHNIQUE: Multidetector CT imaging of the chest was performed
following the standard protocol without IV contrast.

[Series 3: routine chest · axial · 0.62mm/px · z∈[-292,+3]mm · 12 of 69 slices shown, 15 images]
[im 5/69  mediastinal]
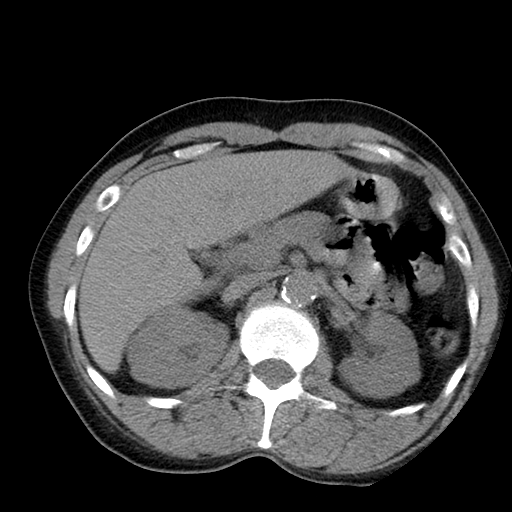
[im 5/69  lung]
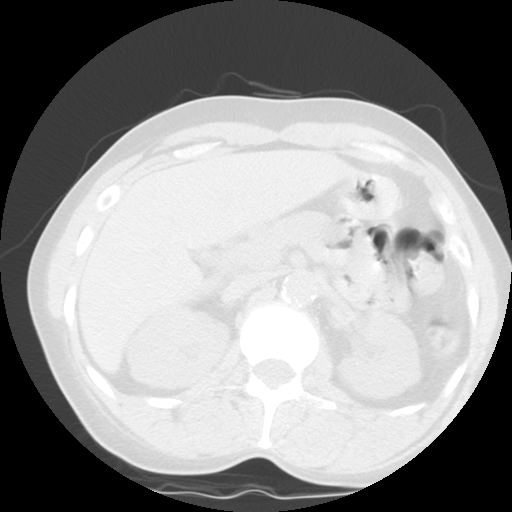
[im 10/69  lung]
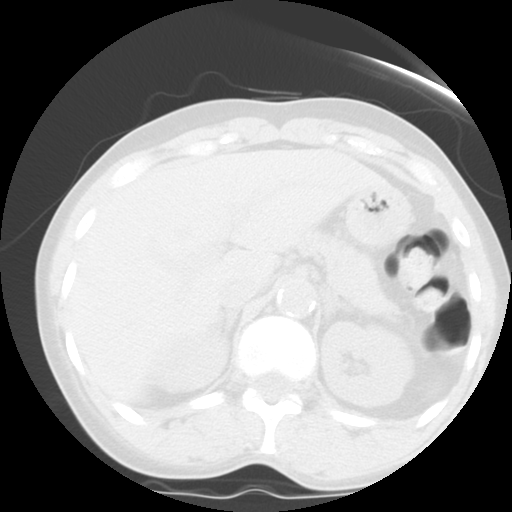
[im 15/69  lung]
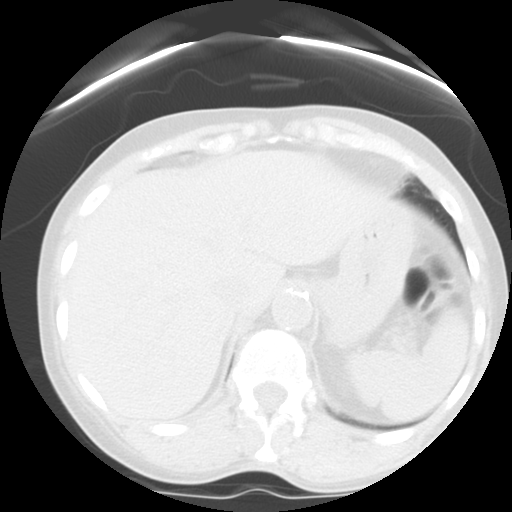
[im 20/69  lung]
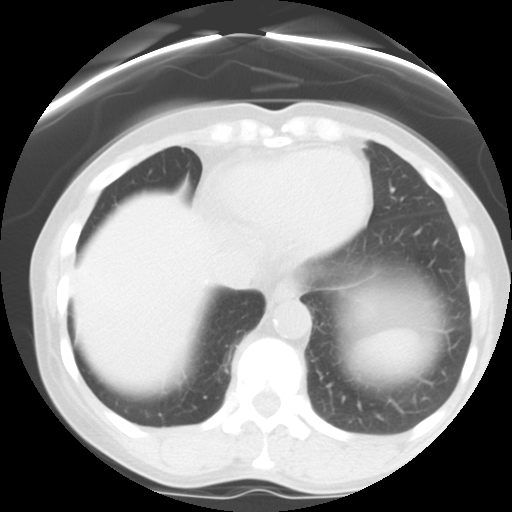
[im 25/69  mediastinal]
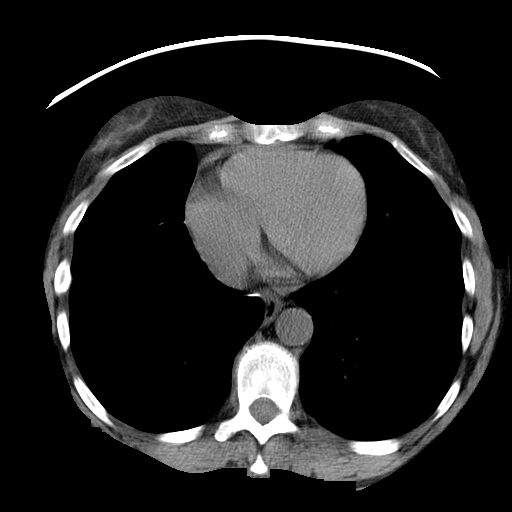
[im 25/69  lung]
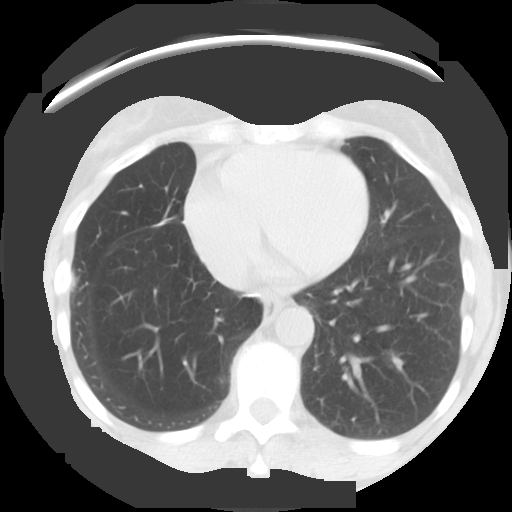
[im 30/69  lung]
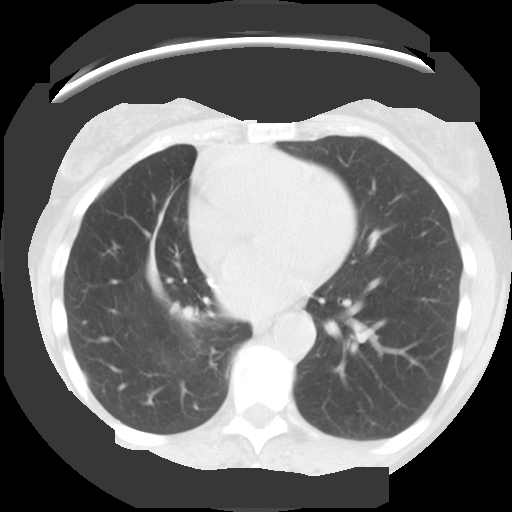
[im 39/69  lung]
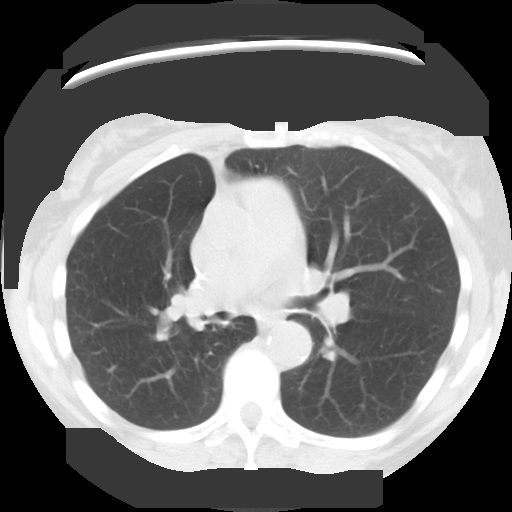
[im 44/69  lung]
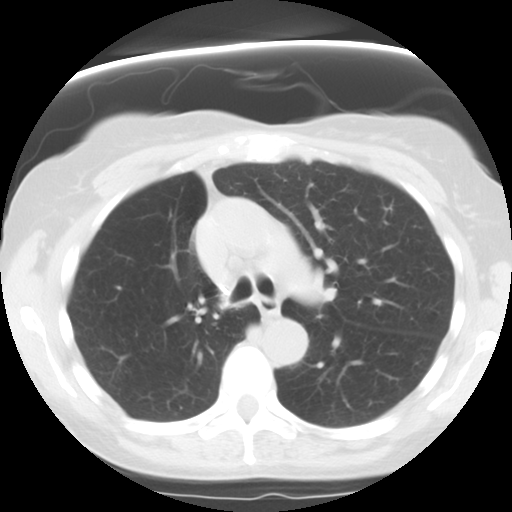
[im 49/69  mediastinal]
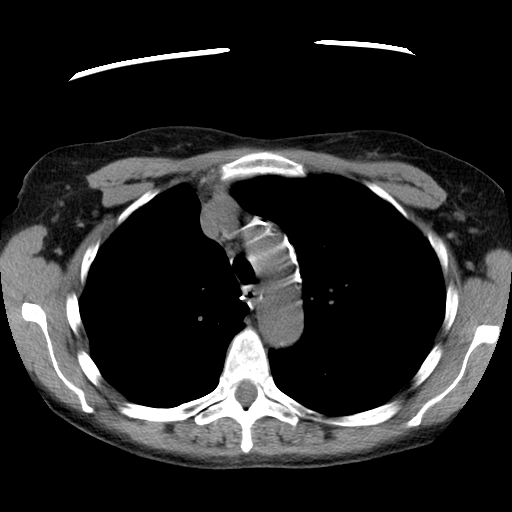
[im 49/69  lung]
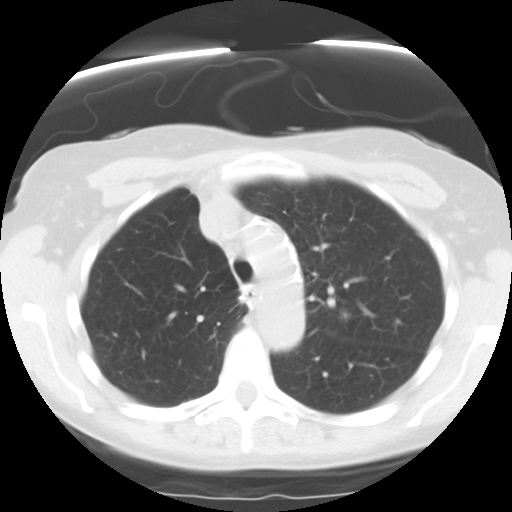
[im 54/69  lung]
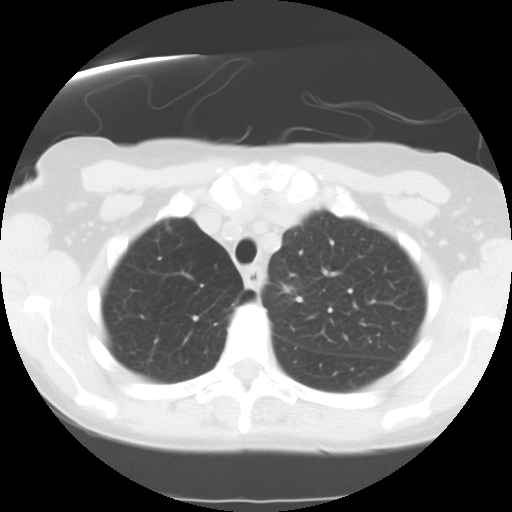
[im 59/69  lung]
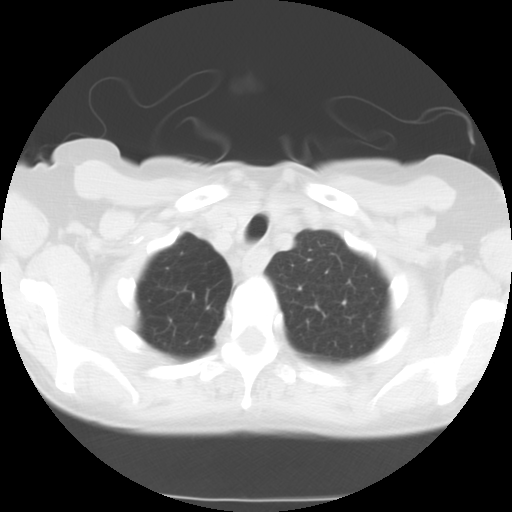
[im 64/69  lung]
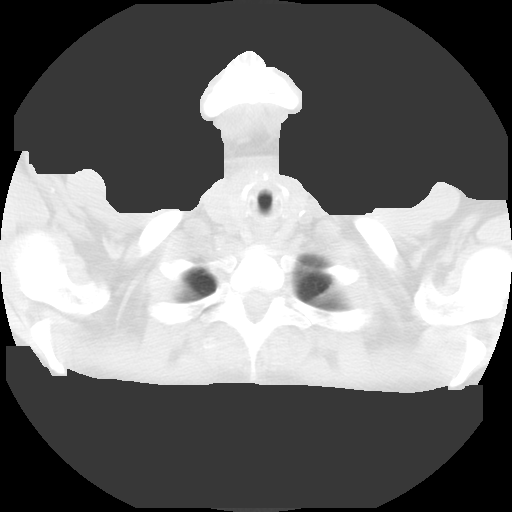

[Series 602: sagittal body · sagittal · 0.67mm/px · 3 of 129 slices shown]
[im 26/129  lung]
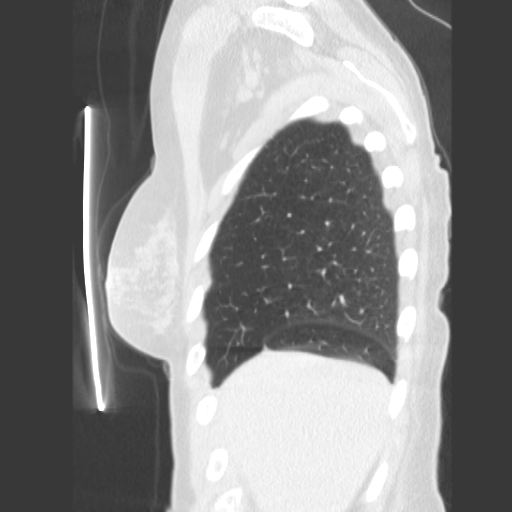
[im 52/129  lung]
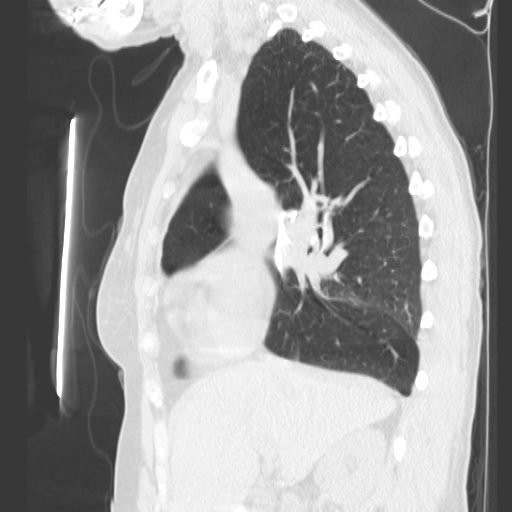
[im 77/129  lung]
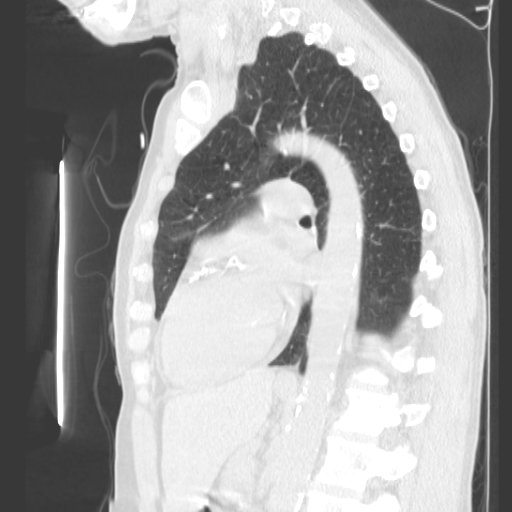

[15 of 36 positions shown; findings below may reference images not displayed]

FINDINGS: Postop lobectomy changes on the right with surgical clips
in the right hilum.  There is scarring in the right base which is
unchanged.  There is COPD with hyperinflation and mild emphysema.
1 cm patchy density the left medial apex is stable and appears to
represent scar.  Mild right hilar adenopathy is stable.  There is
no pathologic adenopathy in the mediastinum.  There is no acute
infiltrate or effusion.  There is coronary artery and aortic arch
calcification.
IMPRESSION: Postsurgical changes on the right with scarring.  No recurrent mass
is seen.  There has been no change from the prior CT.

## 2009-09-27 IMAGING — CR DG CHEST 2V
2 series · 2 of 2 positions shown · non-contrast
Comparison: 06/12/2007

CLINICAL DATA: Lung cancer/short of breath

CHEST - 2 VIEW

[w chest pa]
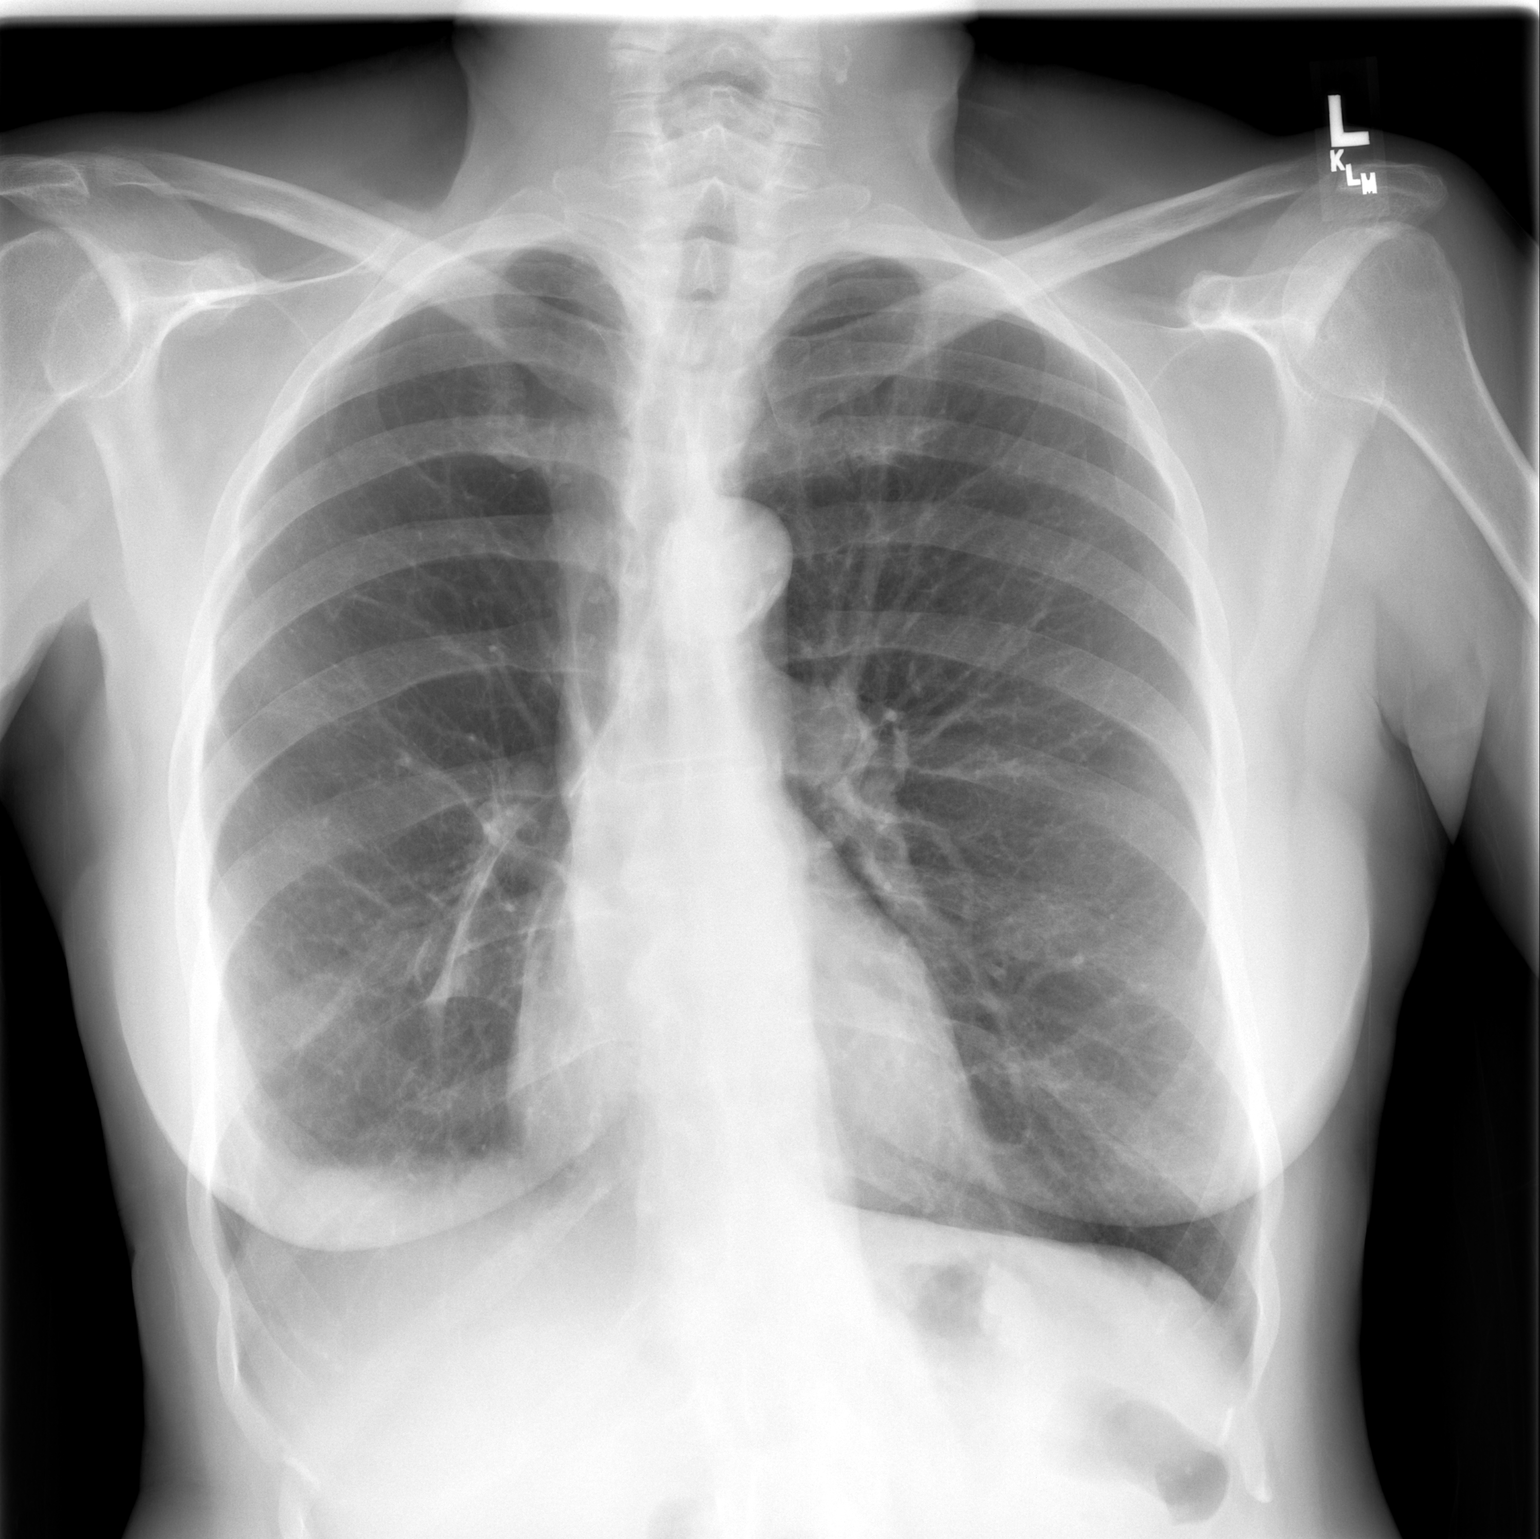

[w chest lat]
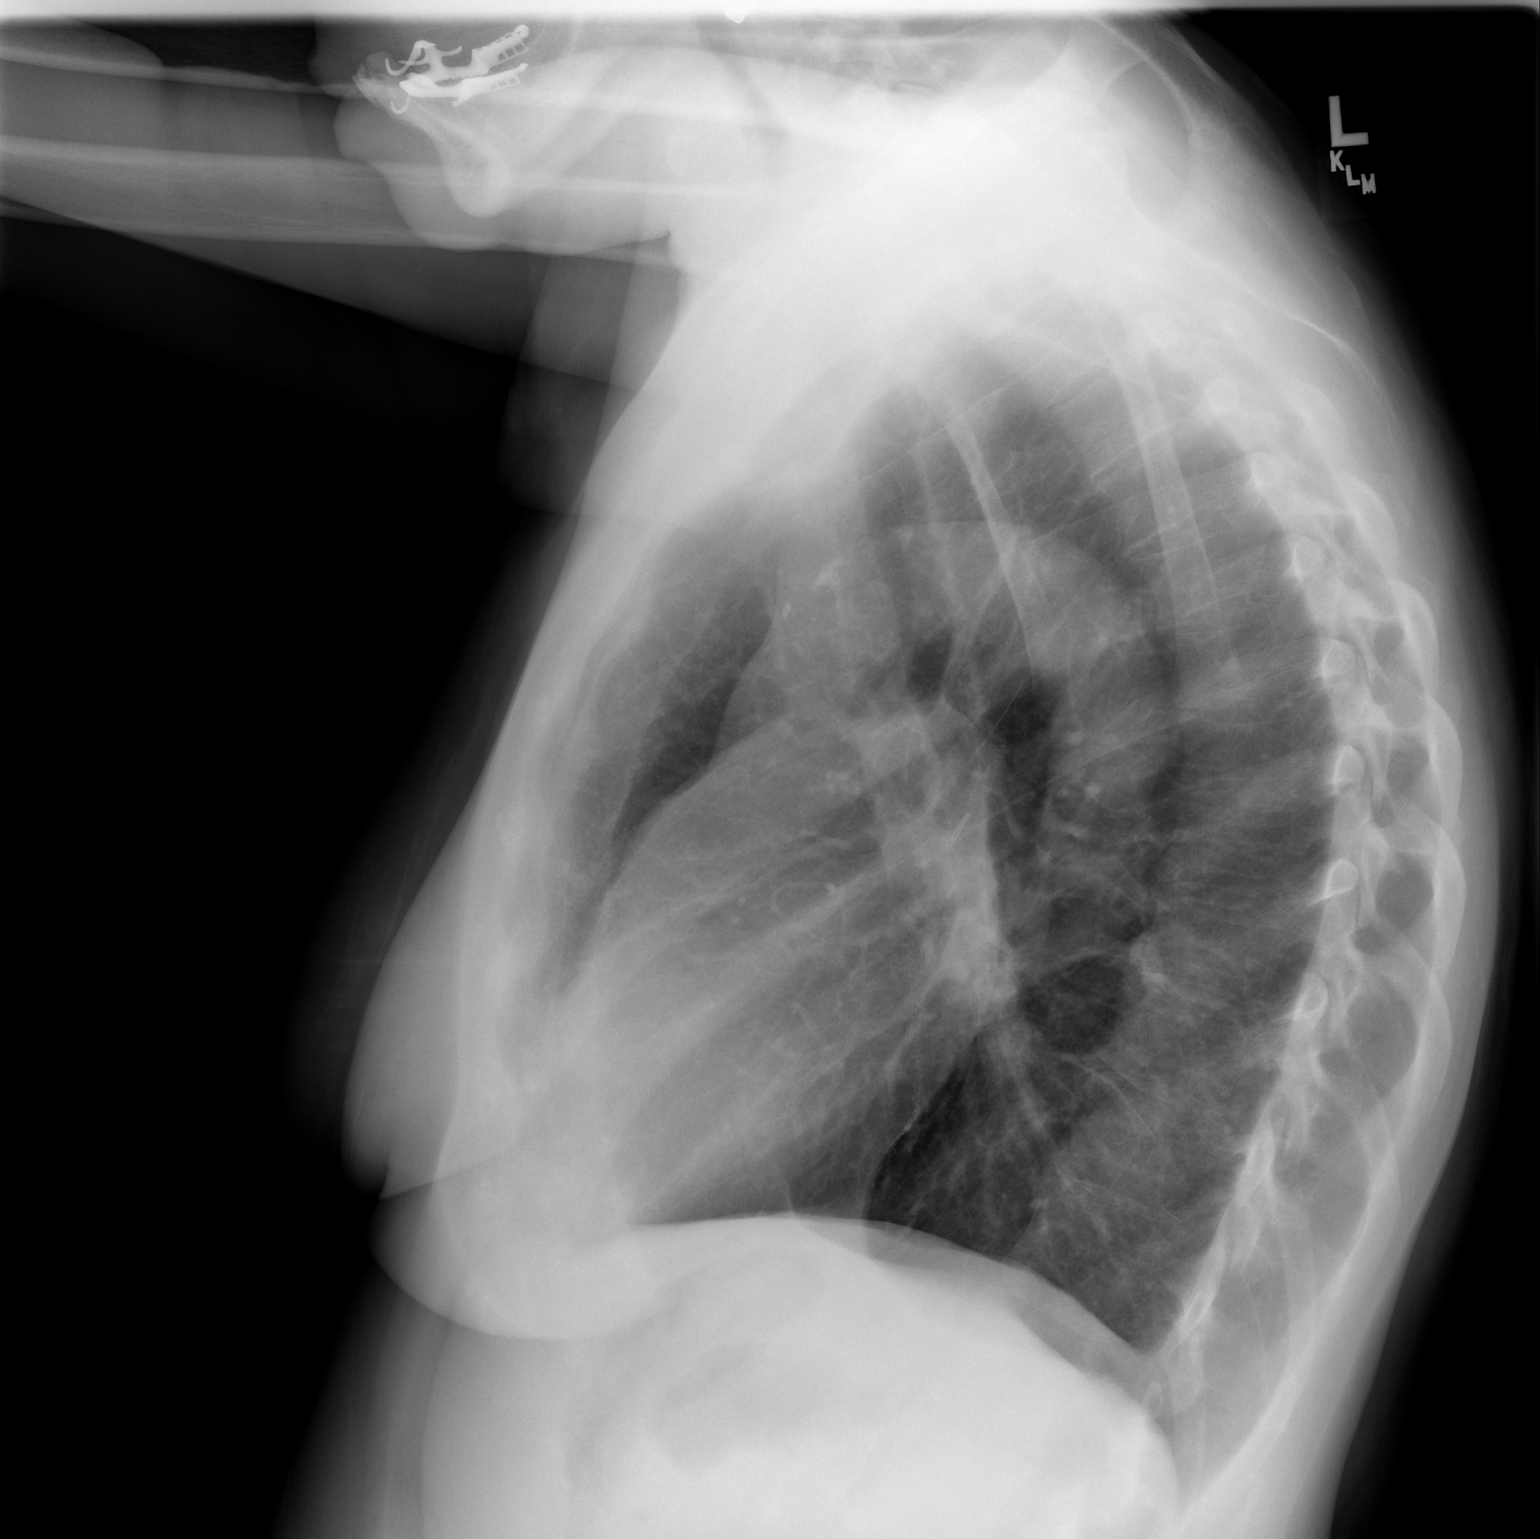

[2 of 2 positions shown; findings below may reference images not displayed]

FINDINGS: Heart and mediastinal contours remain normal.  Lungs
clear.  Chronic right pleural thickening.  No osseous lesions.
IMPRESSION: Chronic right pleural thickening - no active disease.

## 2009-09-27 IMAGING — CT CT HEAD W/O CM
1 of 2 series · 13 of 30 positions shown, 17 images · non-contrast
Comparison: None

CLINICAL DATA: Headache and hypertension.  History of lung cancer.

CT HEAD WITHOUT CONTRAST
TECHNIQUE: Contiguous axial images were obtained from the base of
the skull through the vertex without contrast.

[Series 2: brain · axial · 0.47mm/px · z∈[+141,+283]mm · 13 of 32 slices shown, 17 images]
[im 3/32  brain]
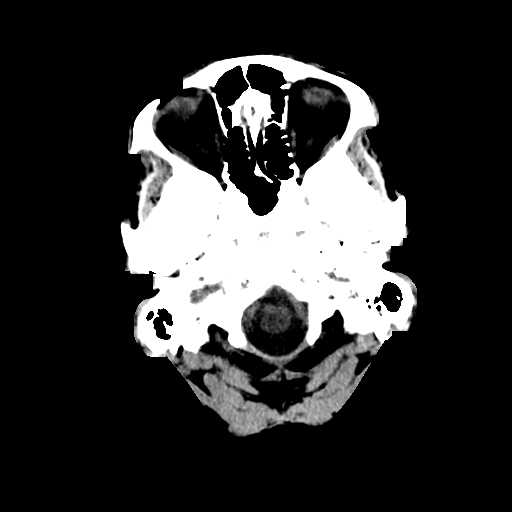
[im 3/32  bone]
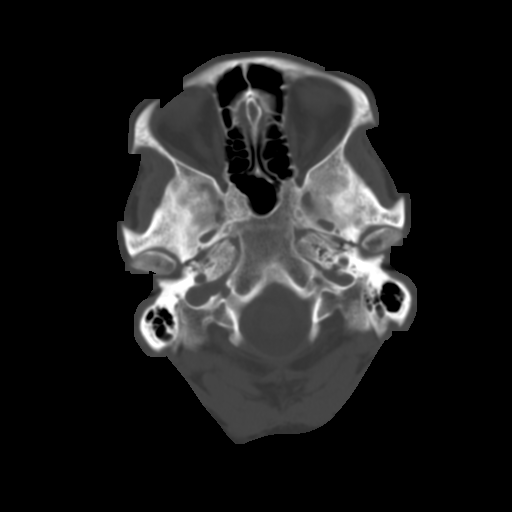
[im 5/32  brain]
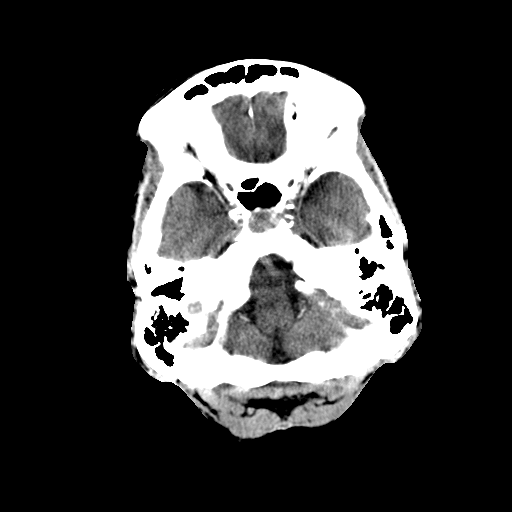
[im 7/32  brain]
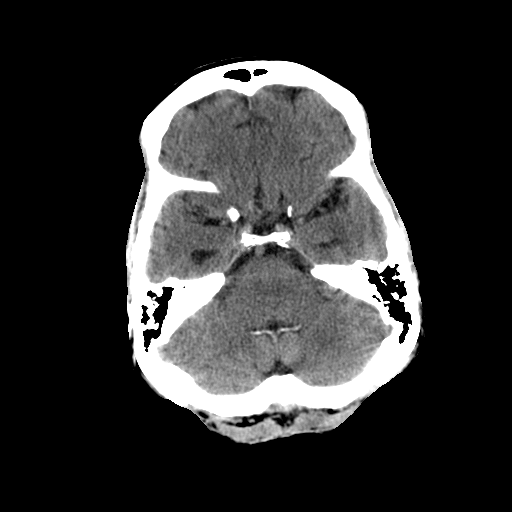
[im 9/32  brain]
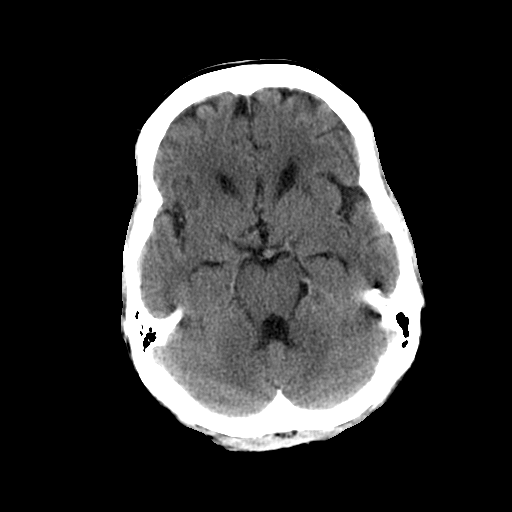
[im 12/32  brain]
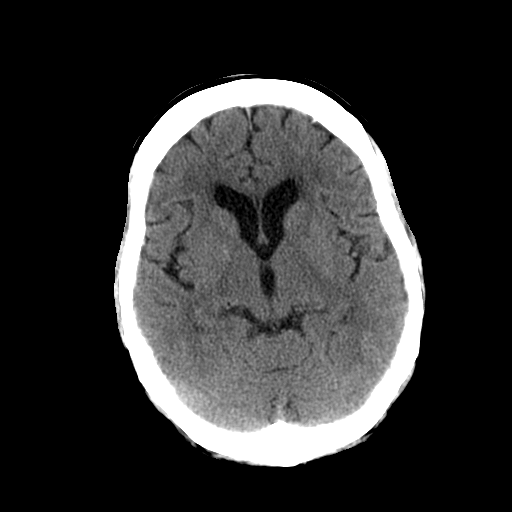
[im 12/32  bone]
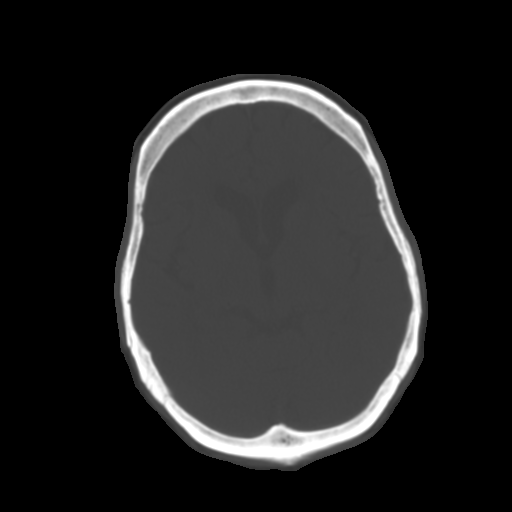
[im 14/32  brain]
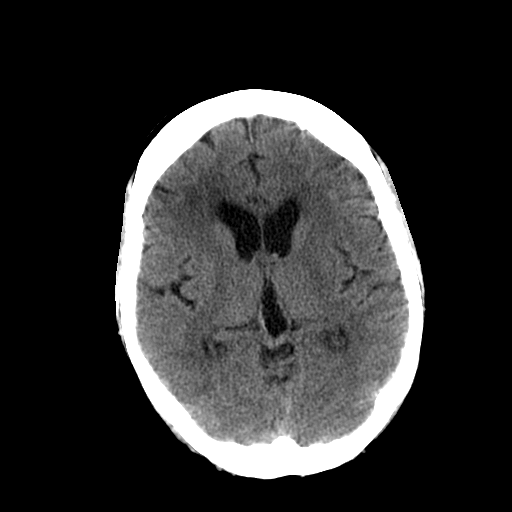
[im 16/32  brain]
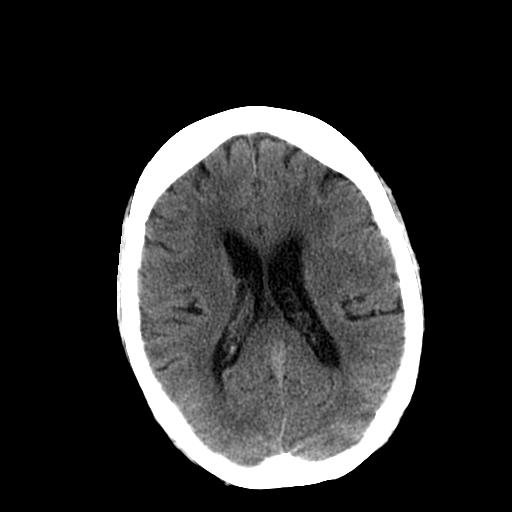
[im 18/32  brain]
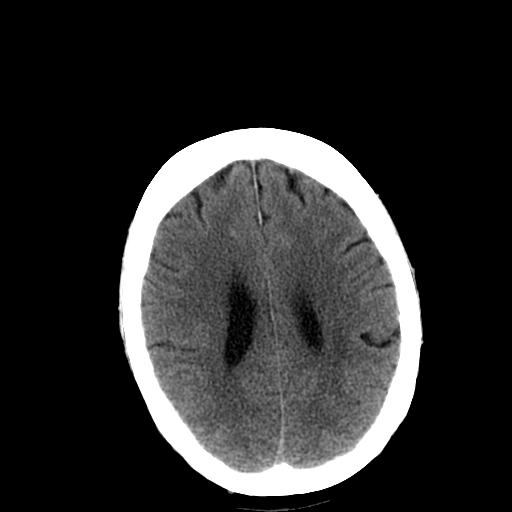
[im 20/32  brain]
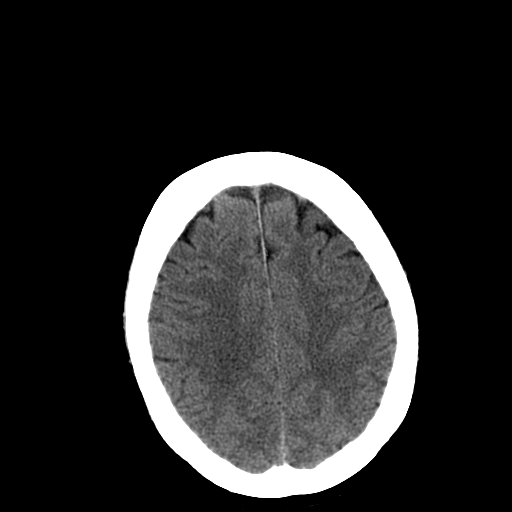
[im 20/32  bone]
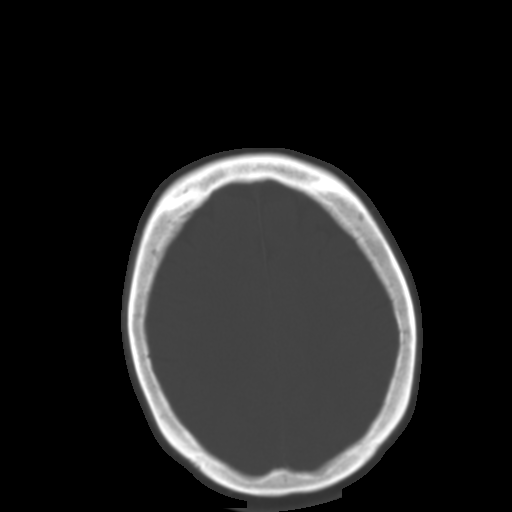
[im 23/32  brain]
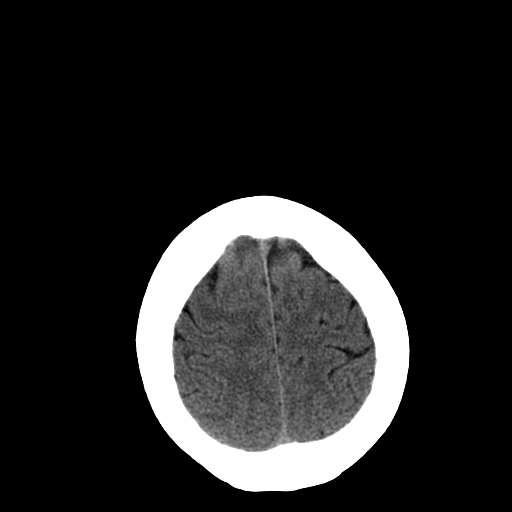
[im 25/32  brain]
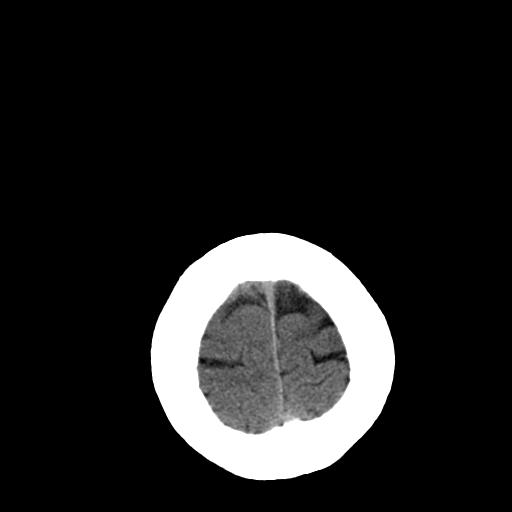
[im 27/32  brain]
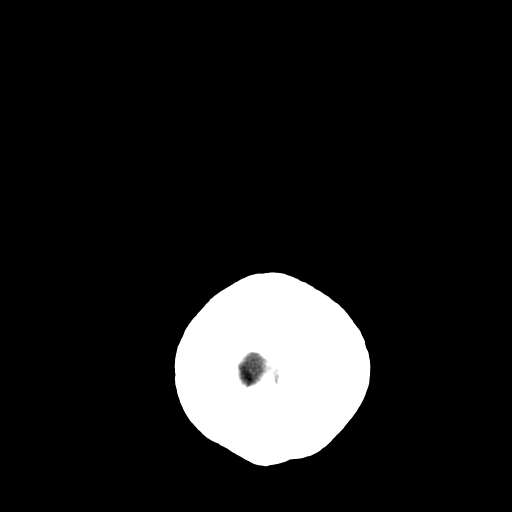
[im 29/32  brain]
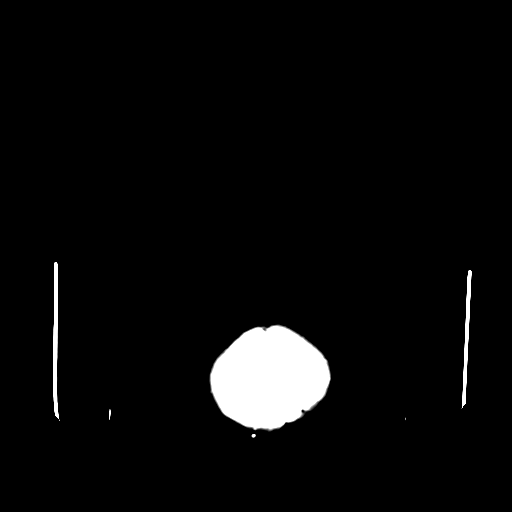
[im 29/32  bone]
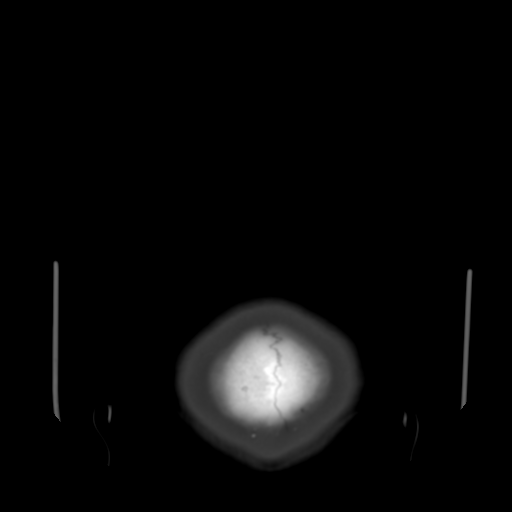

[13 of 30 positions shown; findings below may reference images not displayed]

FINDINGS: There is no evidence of intracranial hemorrhage, brain
edema, or other signs of acute infarction.  There is no evidence of
intracranial mass lesions or mass effect.  No abnormal extra-axial
fluid collections are identified.

Mild chronic microvascular matter disease is noted.  There is no
evidence of hydrocephalus.  No skull abnormality identified.
IMPRESSION: 1.  No acute intracranial findings.
2.  Mild chronic microvascular matter disease.

## 2010-03-12 ENCOUNTER — Encounter: Payer: Self-pay | Admitting: Thoracic Surgery

## 2010-04-03 IMAGING — CT CT CHEST W/O CM
2 of 3 series · 15 of 30 positions shown, 17 images · non-contrast
Comparison: CT of the chest of 12/11/2007

CLINICAL DATA: History lung carcinoma, follow-up

CT CHEST WITHOUT CONTRAST
TECHNIQUE: Multidetector CT imaging of the chest was performed
following the standard protocol without IV contrast.

[Series 3: routine chest · axial · 0.70mm/px · z∈[-244,-4]mm · 7 of 65 slices shown, 9 images]
[im 9/65  mediastinal]
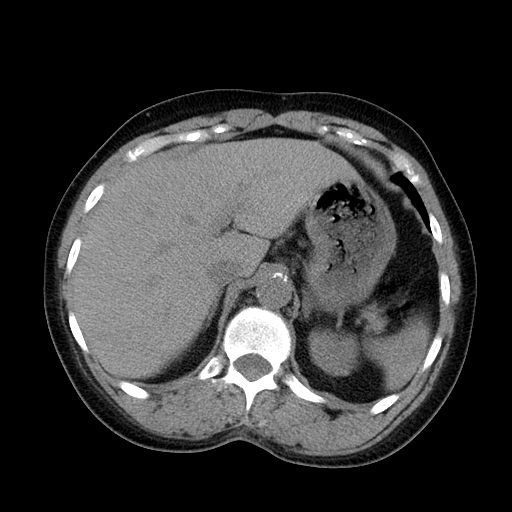
[im 9/65  lung]
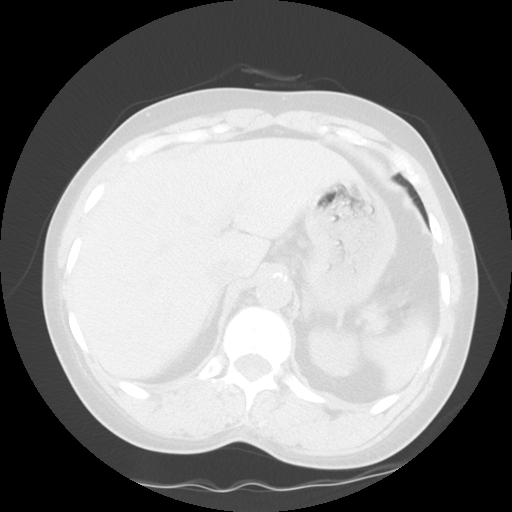
[im 17/65  lung]
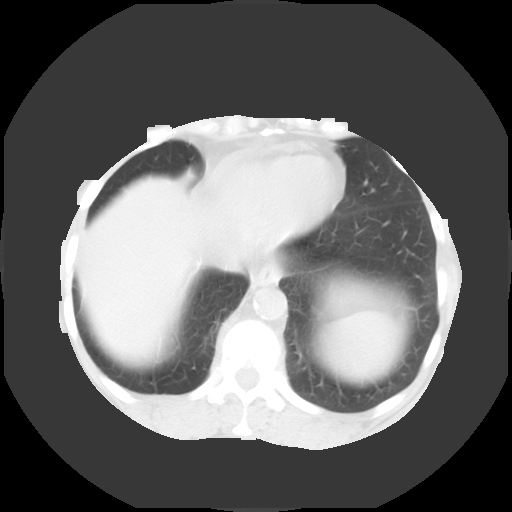
[im 25/65  lung]
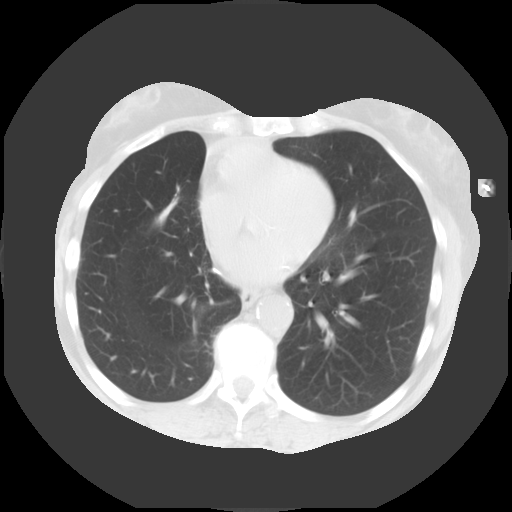
[im 33/65  lung]
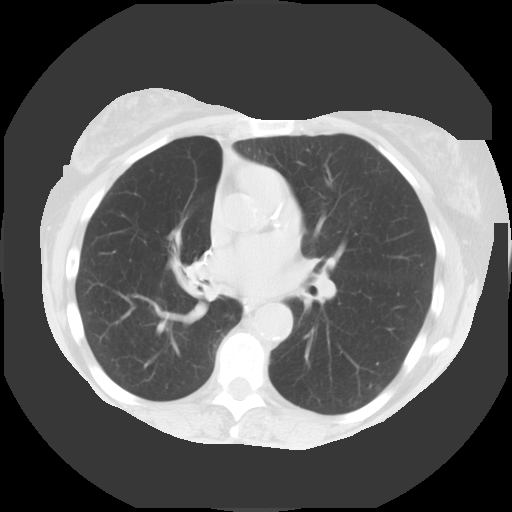
[im 41/65  mediastinal]
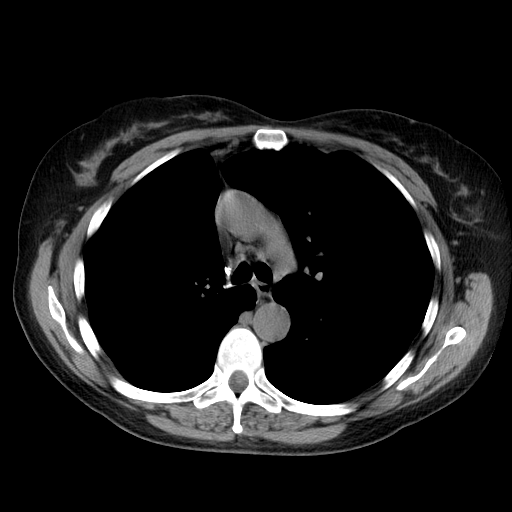
[im 41/65  lung]
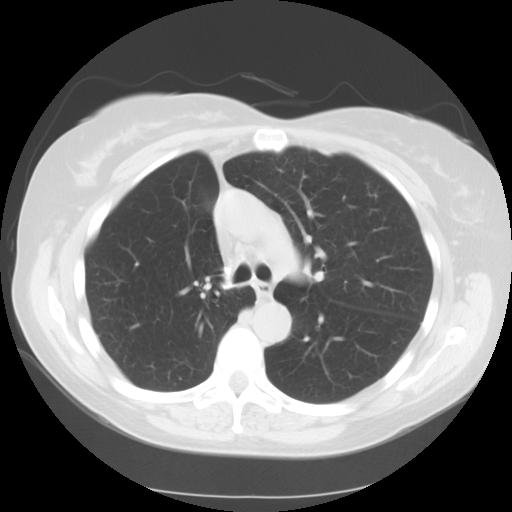
[im 49/65  lung]
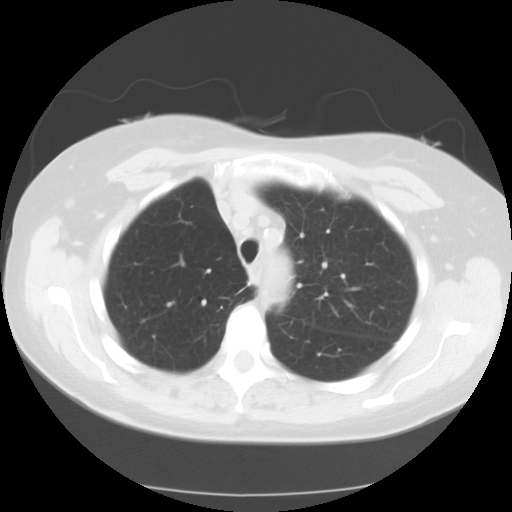
[im 57/65  lung]
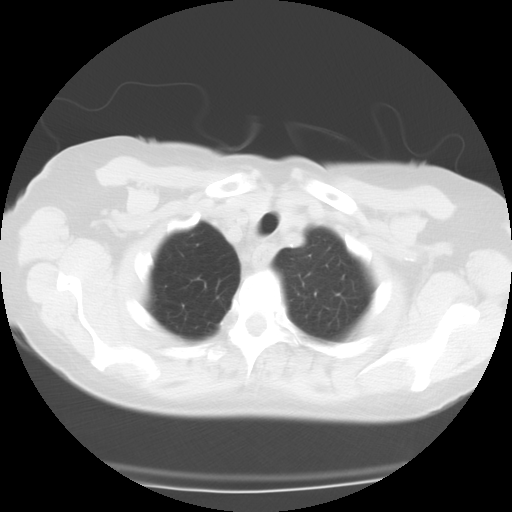

[Series 602: sagittal body · sagittal · 0.70mm/px · 8 of 145 slices shown]
[im 17/145  mediastinal]
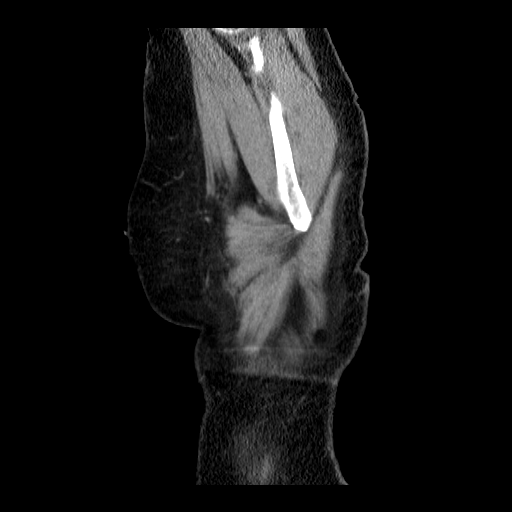
[im 33/145  mediastinal]
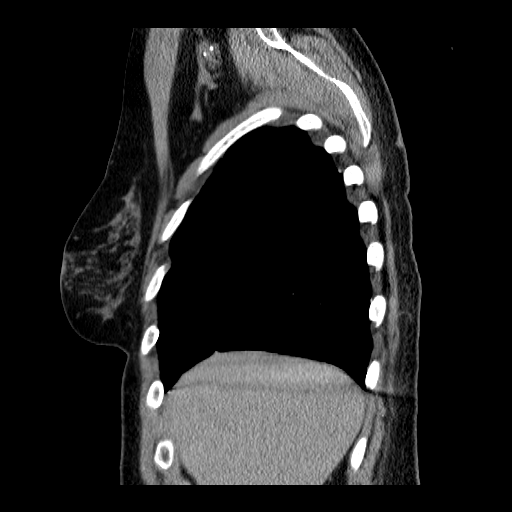
[im 49/145  mediastinal]
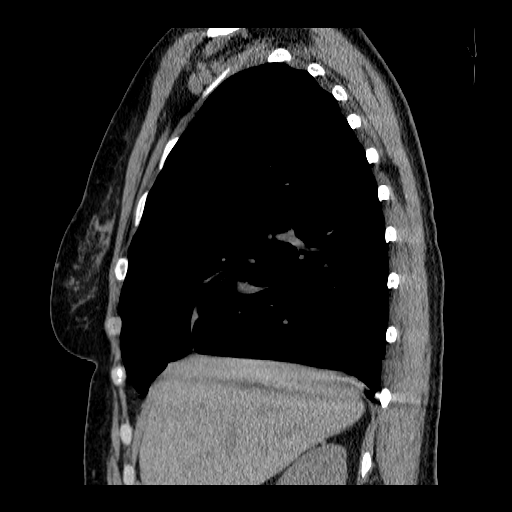
[im 65/145  mediastinal]
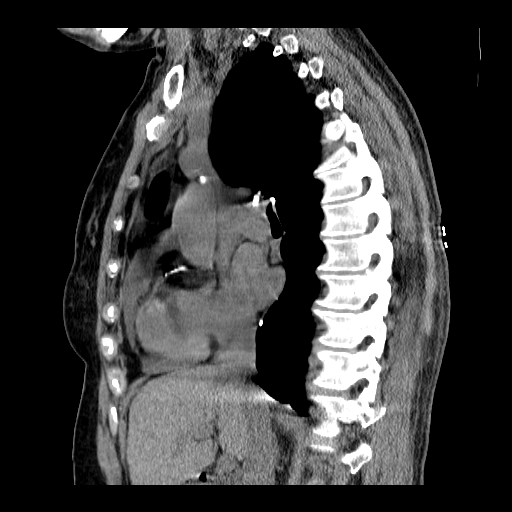
[im 81/145  mediastinal]
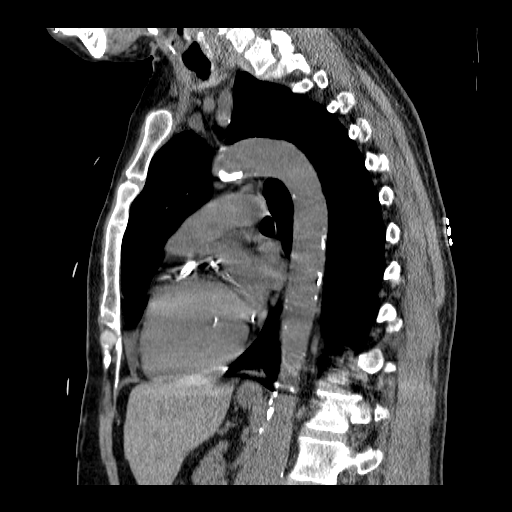
[im 97/145  mediastinal]
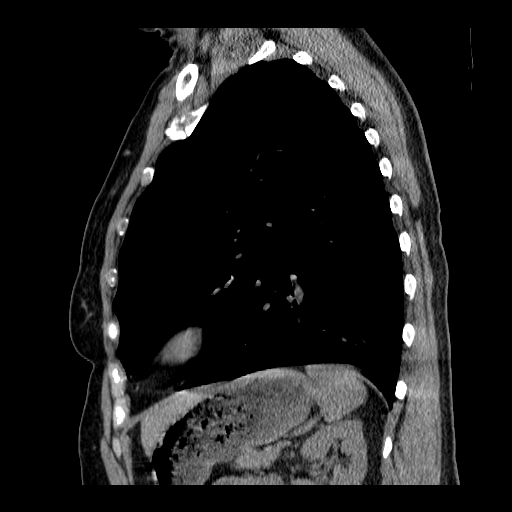
[im 113/145  mediastinal]
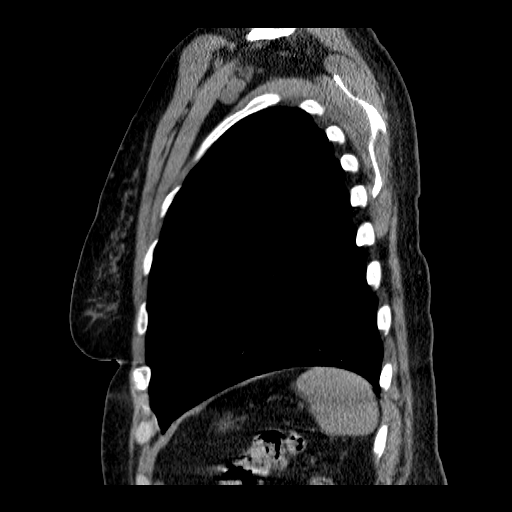
[im 129/145  mediastinal]
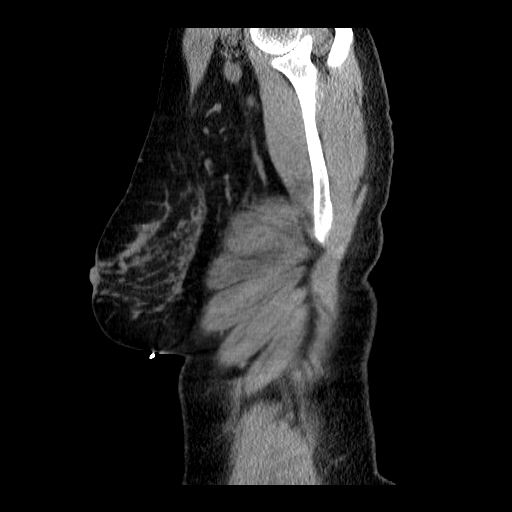

[15 of 30 positions shown; findings below may reference images not displayed]

FINDINGS: Diffuse emphysematous change is again noted particularly
involving the upper lobes near the apices.  Postop changes on the
right are stable with surgical clips in the right suprahilar
region.  Minimal opacity in the medial left upper lobe is stable
and again most consistent with scarring.  No lung nodule,
infiltrate, or effusion is seen.

On soft tissue window images no mediastinal or hilar and and not to
the is seen.  There are coronary artery calcifications noted.
Focal pericardial thickening anteriorly is stable.  A small nodule
in the right mid breast appears stable and most likely benign.
Correlation with mammography is recommended.  No bony abnormality
is seen.
IMPRESSION: 1.  No evidence of recurrence of lung carcinoma.  No adenopathy.
2.  Nodular lesion deep to the right mid breast most likely benign,
stable compared to the prior CT.
3.  COPD.
4.  Coronary artery calcifications.

## 2010-06-01 LAB — URINALYSIS, ROUTINE W REFLEX MICROSCOPIC
Bilirubin Urine: NEGATIVE
Glucose, UA: NEGATIVE mg/dL
Hgb urine dipstick: NEGATIVE
Ketones, ur: NEGATIVE mg/dL
Nitrite: NEGATIVE
Protein, ur: NEGATIVE mg/dL
Specific Gravity, Urine: 1.009 (ref 1.005–1.030)
Urobilinogen, UA: 0.2 mg/dL (ref 0.0–1.0)
pH: 6.5 (ref 5.0–8.0)

## 2010-06-01 LAB — COMPREHENSIVE METABOLIC PANEL
ALT: 13 U/L (ref 0–35)
AST: 19 U/L (ref 0–37)
Albumin: 4.3 g/dL (ref 3.5–5.2)
Alkaline Phosphatase: 83 U/L (ref 39–117)
BUN: 18 mg/dL (ref 6–23)
CO2: 24 mEq/L (ref 19–32)
Calcium: 9.4 mg/dL (ref 8.4–10.5)
Chloride: 104 mEq/L (ref 96–112)
Creatinine, Ser: 1.08 mg/dL (ref 0.4–1.2)
GFR calc Af Amer: >60 mL/min (ref >60)
GFR calc non Af Amer: 51 mL/min — ABNORMAL LOW (ref >60)
Glucose, Bld: 117 mg/dL — ABNORMAL HIGH (ref 70–99)
Potassium: 3 mEq/L — ABNORMAL LOW (ref 3.5–5.1)
Sodium: 139 mEq/L (ref 135–145)
Total Bilirubin: 0.8 mg/dL (ref 0.3–1.2)
Total Protein: 7.3 g/dL (ref 6.0–8.3)

## 2010-06-01 LAB — CBC
HCT: 40.9 % (ref 36.0–46.0)
Hemoglobin: 13.7 g/dL (ref 12.0–15.0)
MCHC: 33.6 g/dL (ref 30.0–36.0)
MCV: 83.7 fL (ref 78.0–100.0)
Platelets: 207 10*3/uL (ref 150–400)
RBC: 4.88 MIL/uL (ref 3.87–5.11)
RDW: 14.4 % (ref 11.5–15.5)
WBC: 6.7 10*3/uL (ref 4.0–10.5)

## 2010-06-01 LAB — DIFFERENTIAL
Basophils Absolute: 0.1 10*3/uL (ref 0.0–0.1)
Basophils Relative: 1 % (ref 0–1)
Eosinophils Absolute: 0.1 10*3/uL (ref 0.0–0.7)
Eosinophils Relative: 2 % (ref 0–5)
Lymphocytes Relative: 32 % (ref 12–46)
Lymphs Abs: 2.1 10*3/uL (ref 0.7–4.0)
Monocytes Absolute: 0.5 10*3/uL (ref 0.1–1.0)
Monocytes Relative: 8 % (ref 3–12)
Neutro Abs: 3.9 10*3/uL (ref 1.7–7.7)
Neutrophils Relative %: 58 % (ref 43–77)

## 2010-07-05 NOTE — H&P (Signed)
Amanda Rivers, AUFDERHEIDE NO.:  [id number]   MEDICAL RECORD NO.:  [id number]          PATIENT TYPE:  OUT   LOCATION:  GYN                          FACILITY:  [facility]   PHYSICIAN:  Nicanor Alcon, M.D. DATE OF BIRTH:  [birth year]   DATE OF ADMISSION:  06/19/2006  DATE OF DISCHARGE:                              HISTORY & PHYSICAL   CHIEF COMPLAINT:  Lung mass.   HISTORY OF PRESENT ILLNESS:  This 65 year old patient has a long history  of smoking and chronic obstructive pulmonary disease and was found to  have a right upper lobe lesion that was positive on PET scan with a  standard uptake value of 4.2.  The lesion was 2 cm in size.  There is no  evidence of any mediastinal or hilar adenopathy.  There was also some  very small lesions in the left lower lobe.  She was recently found to  have a pelvic mass that has been worked up by Dr. Gracy Racer.   PAST MEDICAL HISTORY:  Significant for hypertension.   ALLERGIES:  SHE HAS NO ALLERGIES.   MEDICATIONS:  She is on:  1. Norvasc 10 mg a day.  2. Nexium 40 mg a day.  3. Hydrochlorothiazide 25 mg a day.  4. Lexapro 10 mg a day.  5. Lunesta at night.  6. Advair 250/50 twice a day.   FAMILY HISTORY:  Noncontributory.   SOCIAL HISTORY:  She is married.  Has one child.  Works as a [employer]ar[employer] manager.  Smokes one pack of cigarettes a day and is trying to stop smoking.  She  does not drink alcohol on a regular basis.   REVIEW OF SYSTEMS:  She has had some recent weight loss.  This was  secondary to a severe sinus infection, that she could not eat.  Her  weight is 140 pounds.  She is 5 feet 11 inches.  CARDIAC:  No angina or  atrial fibrillation.  PULMONARY:  No hemoptysis or wheezing.  See  history of present illness.  GI:  She has chronic reflux.  GU:  No  kidney disease or dysuria.  VASCULAR:  No claudication, DVT, TIAs.  NEUROLOGICAL:  No headaches, blackouts, or seizures.  MUSCULOSKELETAL:  No joint pains or arthritis.  PSYCHIATRIC:  Takes Lexapro for depression.  No change in her eyesight or hearing.  HEMATOLOGICAL:  No problems with  bleeding or anemia.   PHYSICAL EXAMINATION:  GENERAL:  She is a well-developed [place]-  American female in no acute distress.  VITAL SIGNS:  Her blood pressure is 122/74, pulse 93, respirations 18,  saturations are 99%.  HEENT:  Head is atraumatic.  Eyes:  Pupils equal, reactive to light and  accommodation.  Extraocular movements are normal.  Ears:  Tympanic  membranes are intact.  NECK:  Supple without thyromegaly or bruits.  CHEST:  Clear to auscultation and percussion.  HEART:  Regular sinus rhythm.  No murmurs.  ABDOMEN:  Soft.  There is no hepatosplenomegaly.  Bowel sounds are  normal.  EXTREMITIES:  Pulses are 2+.  There is no clubbing or edema.  NEUROLOGICAL:  She is oriented x3.  Sensory and motor intact.  Cranial  nerves intact.   IMPRESSION:  1. Right upper lobe mass, probable non-small cell lung cancer.  2. History of tobacco abuse.  3. Chronic obstructive pulmonary disease.  4. Hypertension.  5. Pelvic mass.      Nicanor Alcon, M.D.  Electronically Signed     DPB/MEDQ  D:  06/19/2006  T:  06/19/2006  Job:  [id number]

## 2010-07-05 NOTE — Letter (Signed)
December 15, 2008   Amanda Rivers, [place], [place] [address]   Re:  [name], [name]               DOB:  [birth year]   Dear Dr. Katherine Roan:   The patient came today for followup, and her CT scan showed no evidence  for recurrence for a cancer.  Her blood pressure is 158/99, pulse 75,  sats were 98%.  Because she has to pay to for all of her CT scans, we  planned to see her back in a year with another CT scan, but she is now  over 2 years since her surgery but no evidence of recurrence.   Nicanor Alcon, M.D.  Electronically Signed   DPB/MEDQ  D:  12/15/2008  T:  12/16/2008  Job:  [id number]

## 2010-07-05 NOTE — Discharge Summary (Signed)
Amanda Rivers, Amanda Rivers             ACCOUNT NO.:  [id number]   MEDICAL RECORD NO.:  [id number]          PATIENT TYPE:  INP   LOCATION:  1540                         FACILITY:  [facility]   PHYSICIAN:  Agnes Lawrence, M.D.DATE OF BIRTH:  [birth year]   DATE OF ADMISSION:  09/25/2006  DATE OF DISCHARGE:  09/29/2006                               DISCHARGE SUMMARY   CHIEF COMPLAINT:  The patient is a 65 year old with a complex cystic  ovarian mass who presents for exploratory laparotomy and bilateral  salpingo-oophorectomy.  Please see the dictated history and physical as  per Dr. Marti Sleigh for further details.   HOSPITAL COURSE:  The patient was admitted.  She underwent a bilateral  salpingo-oophorectomy and ureterolysis.  Please see the dictated  operative summary.  On postoperative day #1, her hemoglobin was 9.8.  She was hemodynamically stable.  On postoperative day #1, she had a  fever of 101.8.  The chest x-ray was consistent with atelectasis and an  early postop ileus.  She also had a history of a VATS procedure.  She  was started on Levaquin,  and her p.o. intake was restricted.  Her bowel  function gradually returned.  Her fever curve improved.  She was  subsequently discharged to home on postoperative day #4, tolerating a  regular diet.   DISCHARGE DIAGNOSES:  1. Benign serous and mucinous ovarian cyst.  2. Rule out early postoperative pneumonia.   PROCEDURE:  Exploratory laparotomy, bilateral salpingo-oophorectomy, and  ureterolysis.   CONDITION:  Stable.   DIET:  Regular.   ACTIVITY:  Progressive activity, pelvic rest.   MEDICATIONS:  1. Levaquin.  2. Percocet.  3. Her preoperative medications.   DISPOSITION:  The patient was to follow up in the GYN oncology office on  October 01, 2006 at 10 a.m.Agnes Lawrence, M.D.  Electronically Signed     LAJ/MEDQ  D:  11/13/2006  T:  11/14/2006  Job:  [id number]   cc:   Caswell Corwin, R.N.  [address]  [place], [place] [address]   Nicanor Alcon, M.D.  [address]  [place]  [place] [address]   Frederico Hamman, M.D.  Fax: [phone]

## 2010-07-05 NOTE — Op Note (Signed)
Amanda Rivers, Amanda Rivers             ACCOUNT NO.:  [id number]   MEDICAL RECORD NO.:  [id number]          PATIENT TYPE:  INP   LOCATION:  2550                         FACILITY:  [facility]   PHYSICIAN:  Nicanor Alcon, M.D. DATE OF BIRTH:  [birth year]   DATE OF PROCEDURE:  06/21/2006  DATE OF DISCHARGE:                               OPERATIVE REPORT   PREOPERATIVE DIAGNOSIS:  Right upper lobe mass.   POSTOPERATIVE DIAGNOSIS:  Right upper lobe mass.   OPERATION:  Right video-assisted thoracoscopic surgery lobectomy with  node dissection.   SURGEON:  Nicanor Alcon, M.D.   FIRST ASSISTANT:  John Giovanni, P.A.-C.   ANESTHESIA:  General endotracheal anesthesia.   After percutaneous insertion of all monitoring lines, the patient  underwent general anesthesia, was turned to the right lateral  thoracotomy position and was prepped and draped in a true sterile  manner.  Two trocar sites were made in the anterior and posterior  axillary line at the seventh intercostal space.  Two trocars were  inserted.  A dual-lumen tube was inserted.  The lung was deflated.  A 0-  degree and then we switched to a 30-degree scope was inserted and we  were able to get the lung deflated but we had to make an anterior  incision of approximately 5-6 cm over the fifth intercostal space and  then small intrusion of the insertion of the triangle oscillation to use  for a retractor to retract the lung laterally.  Then using the VATS  anterior approach, we which dissected out two 10R nodes and then  dissected out the superior pulmonary vein branch to the upper lobe,  looped it with a #14 Foley catheter and used that to guide a gray  stapler and fired the gray stapler and divided the superior pulmonary  vein.  Then the apical posterior branch was dissected out.  In a like  manner, a gray stapler was used to the staple and divide it.  This was  the [employer]  There were several 10R nodes that had to be  resected from around the bronchus and then we had another small  posterior branch off the pulmonary artery, which was ligated with 2-0  silk proximally, clipped and divided.  Several more 10R nodes were  dissected free and then the bronchus was dissected free and using the  red rubber Robinson catheter, we fired a green 45 stapler and divided  the right upper lobe, and the fissure was divided with the Autosuture  green 60 staplers with three applications.  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  This had been  taken with a wedge with an Equities trader.  We then placed the right  upper lobe into a larger bag and removed the Endobag through the  anterior incision.  Two chest tubes were brought in through the trocar  sites and tied in place with 0 silk.  A single On-Q was placed subpleurally in the usual fashion.  A Marcaine  block was done in the usual fashion.  Two pericostals were placed and  then the wounds were closed with 32-0 Vicryl and Ethicon and Dermabond  for the skin.  The patient was turned to the recovery room in stable  condition.      Nicanor Alcon, M.D.  Electronically Signed     DPB/MEDQ  D:  06/21/2006  T:  06/21/2006  Job:  [id number]   cc:   Ernestene Kiel, M.D.

## 2010-07-05 NOTE — Consult Note (Signed)
Amanda Rivers, KASSIS NO.:  [id number]   MEDICAL RECORD NO.:  [id number]          PATIENT TYPE:  OUT   LOCATION:  GYN                          FACILITY:  [facility]   PHYSICIAN:  Marti Sleigh, M.D.DATE OF BIRTH:  15-Dec-1945   DATE OF CONSULTATION:  DATE OF DISCHARGE:                                 CONSULTATION   CHIEF COMPLAINT:  Postoperative followup.   INTERVAL HISTORY:  Since hospital discharge, the patient has done well.  She denies any GI or GU symptoms.  Has no pelvic pain pressure, vaginal  bleeding or discharge.  Functional status has been excellent.  She has  returned to work full time.   HISTORY OF PRESENT ILLNESS:  Patient underwent exploratory laparotomy on  September 25, 2006 for a complex pelvic mass.  She was found to have benign  serous and mucinous cysts of both ovaries.  They were completely  resected.  She had an uncomplicated postoperative course.   PHYSICAL EXAMINATION:  Weight 140 pounds.  GENERAL:  The patient is a healthy African American female in no acute  distress.  HEENT:  Negative.  NECK:  Supple without thyromegaly.  ABDOMEN:  Soft and nontender.  No mass, organomegaly or hernia noted.  Low midline incision is healing well.  PELVIC EXAM:  BUS, vagina, and urethra are normal.  Cervix is surgically  absent.  Bimanual exam reveals no masses, induration, tenderness, or  nodularity.   IMPRESSION:  Status post resection of bilateral ovarian cystadenomas.  Patient has had a good postoperative recovery.  She can return full  levels of activity.  We will release her from our care, and would  recommend she have an annual gynecologic exam.      Marti Sleigh, M.D.  Electronically Signed     DC/MEDQ  D:  11/09/2006  T:  11/09/2006  Job:  [id number]   cc:   Caswell Corwin, R.N.  [address]  [place], [place] [address]   Nicanor Alcon, M.D.  [address]  [place]  [place] [address]   Frederico Hamman, M.D.  [phone]   Lucianne Lei, M.D.  Fax: [phone]

## 2010-07-05 NOTE — Letter (Signed)
November 20, 2006   Ernestene Kiel, M.D.  [address], [place] [address]   Re:  Amanda Rivers, Amanda Rivers               DOB:  Jan 24, 1946   Dear Amanda Rivers:   I saw the patient back for followup today.  Her blood pressure was up to  190/120.  Her pulse was 100, respirations 18, sats were 99%, chest x-ray  showed no active cardiopulmonary disease, just right lower lobe  scarring, really no evidence of recurrence or cancer.  I plan to see her  back again in 3 months, get a CT scan at that time.  I did tell her to  check with you regarding her blood pressure.   Nicanor Alcon, M.D.  Electronically Signed   DPB/MEDQ  D:  11/20/2006  T:  11/20/2006  Job:  [id number]   cc:   Marti Sleigh, M.D.

## 2010-07-05 NOTE — Assessment & Plan Note (Signed)
OFFICE VISIT   Amanda Rivers, Amanda Rivers  DOB:  04-28-1945                                        June 10, 2008  CHART #:  [id number]   The patient returned today.  Her blood pressure was elevated at 200/140,  pulse 100, respirations 18, and sats were 98%.  Chest x-ray showed no  evidence of recurrence of her cancer.  Now 2 years since her surgery.  We will see her back again in 6 months with a CT scan.  I told her to  see her medical doctor regarding her blood pressure.  She states she is  out of her blood pressure medication.   Nicanor Alcon, M.D.  Electronically Signed   DPB/MEDQ  D:  06/10/2008  T:  06/11/2008  Job:  [id number]

## 2010-07-05 NOTE — Consult Note (Signed)
NAME:  Amanda Rivers, Amanda Rivers NO.:  [id number]   MEDICAL RECORD NO.:  [id number]          PATIENT TYPE:  OUT   LOCATION:  GYN                          FACILITY:  [facility]   PHYSICIAN:  Marti Sleigh, M.D.DATE OF BIRTH:  Oct 21, 1945   DATE OF CONSULTATION:  DATE OF DISCHARGE:                                 CONSULTATION   CHIEF COMPLAINT:  Pelvic mass.   INTERVAL HISTORY:  Since her initial visit with me, the patient has  undergone a right video-assisted thoracoscopic surgery with lobectomy  and node dissection by Dr. Nicanor Alcon.  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  The  patient has had an uncomplicated postoperative course.   From a gynecologic point of view, the patient's pelvic mass remains  asymptomatic.  She specifically denies any pelvic pain, pressure, GI or  GU symptoms.   HISTORY OF PRESENT ILLNESS:  The patient was initially referred by Dr.  Gracy Racer regarding management of a large septated cystic mass in  the left pelvis measuring 10.1 x 6.6 cm.  PET scan showed no evidence of  hypermetabolic activity.  We have been deferring surgical resection of  the mass until the patient had her lung cancer taken care of.   PAST MEDICAL HISTORY:  1. Hypertension.  2. Gastroesophageal reflux disease.   PAST SURGICAL HISTORY:  TAH for uterine fibroids.   ALLERGIES:  NO KNOWN DRUG ALLERGIES.   CURRENT MEDICATIONS:  Norvasc and hydrochlorothiazide.   FAMILY HISTORY:  The patient's mother had colon cancer.   SOCIAL HISTORY:  The patient is a Quarry manager at [employer].  She  smokes two packs per day, but she has recently quit.   CA-125 value was normal.   REVIEW OF SYSTEMS:  10-point comprehensive review of systems is  negative, except as noted above.   PHYSICAL EXAMINATION:  VITAL SIGNS:  Weight 135 pounds, blood pressure  122/78.  GENERAL:  The patient is a slender black female in  no acute distress.  HEENT is negative.  NECK:  Supple without thyromegaly.  There was no supraclavicular or  inguinal adenopathy.  CHEST:  Her right chest scars are healing well.  ABDOMEN:  Soft, nontender.  No mass, organomegaly, ascites, or hernias  noted.  PELVIC:  EGBUS, vagina, and urethra are normal.  Cervix and uterus are  surgically absent.  Adnexa without masses, except for a fullness of  approximately 7 cm in the left adnexa.  Rectovaginal exam confirms.  There was no cul-de-sac nodularity.   IMPRESSION:  Cystic mass most likely arising from the left ovary with  septations.  I believe this is most likely benign, but given its size  would  recommend that it be surgically resected.  The patient is  scheduled to see Dr. Arlyce Dice in followup in 3 weeks.  We will contact his  office and await his approval for [place] to go ahead with surgery under  general anesthesia.      Marti Sleigh, M.D.  Electronically Signed     DC/MEDQ  D:  07/13/2006  T:  07/13/2006  Job:  [id number]   cc:   Caswell Corwin, R.N.  [address]  [place], [place] [address]   Nicanor Alcon, M.D.  [address]  [place]  [place] [address]   Frederico Hamman, M.D.  Fax: [phone]

## 2010-07-05 NOTE — Letter (Signed)
August 08, 2006   _Dr. Linna Hoff [facility]/>  Dear Amanda Hoff:   I saw [name] back today. She is doing extremely well after her  resection for lung cancer. She had a upper left lobectomy. Her incisions  are well healed. Her lungs are clear to auscultation and percussion, and  her chest x-ray is stable. I think that she can proceed with any type of  ovarian surgery at any time. I plan to see her back again in two months  with a chest x-ray.   Re:  Amanda Rivers, Amanda Rivers               DOB:  24-Jul-1945     Nicanor Alcon, M.D.  Electronically Signed   DPB/MEDQ  D:  08/08/2006  T:  08/09/2006  Job:  [id number]

## 2010-07-05 NOTE — Letter (Signed)
December 11, 2007   Amanda Kiel, MD  [facility] [address], [place] [address].   Re:  Amanda Rivers, Amanda Rivers               DOB:  [birth year]   Dear Dr. Katherine Roan:   I saw the patient back for followup today.  She is now over 18 months  since we did her right upper lobectomy.  Her blood pressure is 179/100,  pulse 95, respirations 18, and sats were 97%.  Lungs are clear to  auscultation and percussion.  Her CT scan showed no evidence of  recurrence of her cancer.  She is doing well overall.  I will see her  back again in 6 months with another CT scan.   Nicanor Alcon, M.D.  Electronically Signed   DPB/MEDQ  D:  12/11/2007  T:  12/11/2007  Job:  [id number]   cc:   Marti Sleigh, M.D.

## 2010-07-05 NOTE — Assessment & Plan Note (Signed)
OFFICE VISIT   THEORY, LUCIUS  DOB:  [birth year]                                        September 19, 2006  CHART #:  [id number]   The patient came today.  She is scheduled to have her abdominal surgery  by Dr. Fermin Schwab on 09/25/2006.  She still has some moderate post-  thoracotomy pain and particularly when she abducts her right arm, but  her incision is well-healed and her chest x-ray is stable, and her lungs  are clear to auscultation and percussion.  I told her to see [place] back  again in 2 months with a chest x-ray.   Nicanor Alcon, M.D.  Electronically Signed   DPB/MEDQ  D:  09/19/2006  T:  09/20/2006  Job:  [id number]

## 2010-07-05 NOTE — Op Note (Signed)
Amanda Rivers, Amanda Rivers NO.:  [id number]   MEDICAL RECORD NO.:  [id number]          PATIENT TYPE:  INP   LOCATION:  0005                         FACILITY:  [facility]   PHYSICIAN:  Marti Sleigh, M.D.DATE OF BIRTH:  [birth year]   DATE OF PROCEDURE:  09/25/2006  DATE OF DISCHARGE:                               OPERATIVE REPORT   PREOPERATIVE DIAGNOSIS:  Complex pelvic mass.   POSTOPERATIVE DIAGNOSES:  Left ovarian cystadenoma, retroperitoneal  fibrosis.   PROCEDURE:  Bilateral salpingo-oophorectomy, lysis of adhesions,  ureterolysis.   SURGEON:  Marti Sleigh, M.D.   ASSISTANT:  Agnes Lawrence, M.D. and Caswell Corwin, R.N.   ANESTHESIA:  General with orotracheal tube.   ESTIMATED BLOOD LOSS:  Was 100 mL.   FINDINGS OF PROCEDURE:  Exploratory laparotomy.  Multiple small bowel  adhesions were densely adherent to the right ovary.  The left ovary was  replaced by an approximately 8 cm cystic mass which was densely adherent  to the sigmoid colon mesentery, the left pelvic side wall peritoneum.  The mass was removed intact.  There was retroperitoneal fibrosis  requiring ureterolysis on the left.  A frozen section returned this as a  serous cystadenoma of the ovary.   Exploration of the upper abdomen was normal.  The appendix appeared  normal and was left in place.   DESCRIPTION OF PROCEDURE:  The patient was brought to the operating room  and after satisfactory attainment of general anesthesia was placed in  the modified lithotomy position in [place].  The anterior  abdominal wall, peritoneum and vagina were prepped with Betadine.  A  Foley catheter was inserted and the patient was draped.  The abdomen was  entered through a midline incision.  Peritoneal washings were obtained  from the pelvis.  The upper abdomen and pelvis were explored with the  above-noted findings.  A Bookwalter retractor was positioned.  Adhesions  of the small  bowel (the ileum), to the right tube and ovary were lysed  with sharp dissection.  The bowel was then packed out of the pelvis.  The left retroperitoneal space was opened, incised in the left pelvic  peritoneum.  The retroperitoneal space was opened, identifying the  external iliac artery and vein, the internal iliac artery and ureter.  Peri-rectal and peri-vesical spaces were developed.  The ovarian vessels  were skeletonized, clamped, cut and free tied and suture ligated with #2-  0 Vicryl.  Using sharp and blunt dissection and the Bovie cautery for  hemostasis, the adhesions of the ovarian cyst to the sigmoid mesentery  and left pelvic side wall were lysed with sharp dissection.  Ultimately  the peritoneum incision was incised down to the level of the ureter.  The ureter was the mobilized from its peritoneal attachments and held  laterally.  This was dissected down to the level of the uterine artery.  Further dissection of the cyst ultimately removed the entire cyst.  The  attachment to the vaginal angle was clamped, divided and suture ligated  using #2-0 Vicryl.  The cyst was sent to pathology and ultimately  returned as a  benign serous cyst adenoma.  Hemostasis in the  retroperitoneal space was achieved with cautery.  This noted that there  is a peritoneal defect beneath the sigmoid colon.  In order to avoid  internal herniation, the peritoneal defect was closed with a running  suture of #2-0 Vicryl.   The right inter-peritoneal space was opened.  The right pelvic side wall  vessels and ureter were identified.  The ovarian vessels were  skeletonized, clamped, cut, pre-tied and suture ligated.  The remainder  of the peritoneum beneath the ovary and tube was incised.  The right  tube and ovary were then removed and sent to permanent section.  Hemostasis was again found to be satisfactory.  The pelvis was irrigated  with saline.  Packs and retractors were removed.  The anterior  abdominal  wall was closed in layers, first being a running mass closure using #1  PDS.  The subcutaneous tissue was irrigated.  Hemostasis was achieved  with cautery and the skin was closed with skin staples.  A dressing was  applied.   The patient was awakened from anesthesia and taken to the recovery room  in satisfactory condition.  The sponge, needle and instrument counts  were correct x2.      Marti Sleigh, M.D.  Electronically Signed     DC/MEDQ  D:  09/25/2006  T:  09/25/2006  Job:  [id number]   cc:   Agnes Lawrence, M.D.  Fax: [phone]   Nicanor Alcon, M.D.  [address]  [place]  [place] [address]   Frederico Hamman, M.D.  Fax: [phone]   Caswell Corwin, R.N.  [address], [place] [address]

## 2010-07-05 NOTE — Consult Note (Addendum)
NAME:  Amanda Rivers, Amanda Rivers NO.:  [id number]   MEDICAL RECORD NO.:  [id number]          PATIENT TYPE:  OUT   LOCATION:  GYN                          FACILITY:  [facility]   PHYSICIAN:  Marti Sleigh, M.D.DATE OF BIRTH:  07/10/1945   DATE OF CONSULTATION:  DATE OF DISCHARGE:                                 CONSULTATION   CHIEF COMPLAINT:  Pelvic mass   HISTORY OF PRESENT ILLNESS:  A 65 year old African-American female seen  in consultation at the request of Dr. Gracy Racer regarding  management of a complex pelvic mass recently discovered on CT scan.  Patient reports that she was entirely asymptomatic and specifically  denies any abdominal or pelvic pain, pressure, GI, or GU symptoms.   The patient has lost approximately thirty pounds over the last few  months and in the course of workup was found to have a pulmonary  lesion. Further evaluation discovered a complex cystic mass measuring  approximately 11 centimeters in the pelvis. A PET scan shows increased  activity in the pulmonary lesion, but no other sites of increased  activity throughout the abdomen or pelvis. Specifically the complex mass  does not have increased uptake.   PAST MEDICAL HISTORY:  1. Hypertension  2. Gastroesophageal reflux disease   PAST SURGICAL HISTORY:  TAH (for uterine fibroids)   ALLERGIES:  NONE.   CURRENT MEDICATIONS:  1. Norvasc  2. Hydrochlorothiazide   FAMILY HISTORY:  The patient's mother had colon cancer.   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 She  smokes two packs per day and lives alone.   ____ QA MARKER: 123 ____HISTOR   HEALTH MAINTENANCE:  Patient has had colonoscopy approximately three  years ago which is negative and mammograms last year.   REVIEW OF SYSTEMS:  A ten point comprehensive review of systems negative  except as noted above.   PHYSICAL EXAMINATION:  VITAL SIGNS:  Weight:  137 pounds, Blood  pressure:  130/70,  Pulse:  80, Respiratory rate:  20.  GENERAL:  The patient is a health African-American female who is  somewhat anxious.  HEENT:  Negative.  NECK:  Supple without palpable thyromegaly. There is no supraclavicular  or inguinal adenopathy.  ABDOMEN:  Soft, nontender. No masses or organomegaly, an __________  hernias are noted.  PELVIC:  BUS, vagina, and urethra are normal. Cervix and uterus are  surgically absent. On bimanual and rectal vaginal exam, there is an  approximately an 8 centimeter cystic mass adjacent to the vaginal cuff  and slightly to the left.   IMPRESSION:  The patient's CT scan and PET scan are reviewed as well as  records from Dr. Marcheta Grammes office.  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   The patient will go ahead with her planned thoracic surgery later this  week. Once we have the pathology returned on that, we will strongly  consider recommending surgical exploration in four to six weeks once she  has recovered from her chest surgery.      Marti Sleigh, M.D.  Electronically Signed     DC/MEDQ  D:  06/19/2006  T:  06/19/2006  Job:  [id number]   cc:   Frederico Hamman, M.D.  Fax: [phone]   Marlyn Corporal, MD   Caswell Corwin, R.N.  [phone]  [place], [place] [address]   Ernestene Kiel, M.D.  Fax: [phone]

## 2010-07-05 NOTE — Consult Note (Signed)
Amanda Rivers, Amanda Rivers NO.:  [id number]   MEDICAL RECORD NO.:  [id number]          PATIENT TYPE:  OUT   LOCATION:  GYN                          FACILITY:  [facility]   PHYSICIAN:  Marti Sleigh, M.D.DATE OF BIRTH:  1946-02-19   DATE OF CONSULTATION:  09/14/2006  DATE OF DISCHARGE:                                 CONSULTATION   CHIEF COMPLAINT:  Pelvic mass.   HISTORY OF PRESENT ILLNESS:  A 65 year old African-American female  presents today for followup and surgical planning for a pelvic mass  which is initially identified in March of this year.  This is a large  septated cystic mass in the left pelvis measuring 10.1 x 6.6 cm.  The  patient also had a left upper lobe lesion which ultimately underwent  fluoroscopic surgery and lobectomy by Dr. Arlyce Dice.  Final pathology  showed an adenocarcinoma of the lung with negative margins and negative  lymph nodes.  The patient has now recovered from the thoracic surgery  and is prepared to undergo laparotomy and bilateral salpingo-  oophorectomy on September 25, 2006.  In the interval, she has had no new  symptoms, has only slight low back pressure.   PAST MEDICAL HISTORY:   MEDICAL ILLNESSES:  1. Adenocarcinoma of the lung, stage I.  2. Gastroesophageal reflux disease.  3. Hypertension.   PAST SURGICAL HISTORY:  TAH for uterine fibroids, 1996, through a low  Pfannenstiel incision.   DRUG ALLERGIES:  None.   CURRENT MEDICATIONS:  Norvasc, hydrochlorothiazide.   FAMILY HISTORY:  Mother with colon cancer.   SOCIAL HISTORY:  The patient is a Quarry manager at [employer].  She  previously smoked 2 packs per day but is trying to quit smoking now that  she has this diagnosis of lung cancer.   CA 125 value is normal.   REVIEW OF SYSTEMS:  Ten-point comprehensive review of systems negative  except as noted above.   PHYSICAL EXAMINATION:  Weight 140 pounds, blood pressure 152/88, pulse  80, respiratory rate 20.  GENERAL:  The patient is a healthy African-American female in no acute  distress.  HEENT:  Negative.  NECK:  Supple without thyromegaly.  There is no supraclavicular or  inguinal adenopathy.  ABDOMEN:  Soft, nontender.  No masses, organomegaly, ascites, or hernias  are noted.  She has a very low Pfannenstiel incision right at the pubic  symphysis.  This is well-healed.  PELVIC:  EG/BUS, vagina, bladder, urethra are normal.  Cervix and uterus  is surgically absent.  On bimanual examination, there is a fullness  throughout the pelvis which is minimally tender to deep palpation.  This  is mobile and smooth.   IMPRESSION:  Complex cystic ovarian mass most likely benign apparent  neoplasm.   PLAN:  We will go ahead as previously scheduled to perform an  exploratory laparotomy through a Pfannenstiel incision, bilateral  salpingo-oophorectomy on August 5.  The risks of surgery including  hemorrhage, infection, injury to adjacent viscera and thromboembolic  complications are outlined.  Anesthetic risks were discussed.  The  patient understands that if this turns out to be  ovarian cancer, we will  complete surgical staging including omentectomy, pelvic and periaortic  lymphadenectomy and peritoneal biopsies.  This may require a midline  incision, and she understands the rationale for this and accepts those  risks.  All questions are answered.  We will go ahead with preoperative  planning in the next several days.      Marti Sleigh, M.D.  Electronically Signed     DC/MEDQ  D:  09/14/2006  T:  09/15/2006  Job:  [id number]   cc:   Nicanor Alcon, M.D.  [address]  [place]  [place] [address]   Frederico Hamman, M.D.  Fax: [phone]   Caswell Corwin, R.N.  [address], [place] [address]

## 2010-07-05 NOTE — Op Note (Signed)
NAMEROSEANNA, JAMES             ACCOUNT NO.:  [id number]   MEDICAL RECORD NO.:  [id number]          PATIENT TYPE:  INP   LOCATION:  3311                         FACILITY:  [facility]   PHYSICIAN:  Nicanor Alcon, M.D. DATE OF BIRTH:  09-25-1945   DATE OF PROCEDURE:  DATE OF DISCHARGE:                               OPERATIVE REPORT   PREOPERATIVE DIAGNOSIS:  Retained pulmonary secretions, right lung.   POSTOPERATIVE DIAGNOSIS:  Retained pulmonary secretions, right lung.   OPERATION PERFORMED:  Fiberoptic bronchoscopy.   ANESTHESIA:  Cetacaine, Xylocaine, and IV sedation.   After local anesthesia with Cetacaine and Xylocaine, the fiberoptic  bronchoscope was passed through the mouth and the patient had a large  amount of secretions.  We had to irrigate it copiously, given him local  Xylocaine and saline to irrigate this out.  The carina was shifted to  the right.  The left mainstem, left upper lobe, and left lower lobe  orifices were normal.  The right mainstem, the bronchial stump was  healing well.  There was a lot of inflammation in the bronchus  intermedius and right lower lobe and the right middle lobe and this was  irrigated out with copious amount of secretions.  After the secretions  had been evacuated, the fiberoptic bronchoscope was removed.  We sent  the secretions for culture.      Nicanor Alcon, M.D.  Electronically Signed     DPB/MEDQ  D:  06/25/2006  T:  06/25/2006  Job:  [id number]

## 2010-07-05 NOTE — Assessment & Plan Note (Signed)
OFFICE VISIT   Amanda, Rivers  DOB:  Dec 30, 1945                                        June 12, 2007  CHART #:  [id number]   Amanda Rivers came for follow-up today.  Her chest x-ray stable.  She is  doing well overall.  Her blood pressure was elevated, though, at  190/100, pulse 100, respirations 18, saturations were 97%.  I told her  we would see her back again in 6 months and will repeat her CT scan at  that time.   Nicanor Alcon, M.D.  Electronically Signed   DPB/MEDQ  D:  06/12/2007  T:  06/12/2007  Job:  [id number]

## 2010-07-08 NOTE — Op Note (Signed)
NAME:  Amanda Rivers, Amanda Rivers NO.:  [id number]   MEDICAL RECORD NO.:  [id number]                   PATIENT TYPE:  OUT   LOCATION:  DFTL                                 FACILITY:  [facility]   PHYSICIAN:  Kathrin Penner, M.D.                DATE OF BIRTH:  20-Apr-1945   DATE OF PROCEDURE:  06/01/2003  DATE OF DISCHARGE:  06/01/2003                                 OPERATIVE REPORT   PREOPERATIVE DIAGNOSES:  1. Mass, left upper arm.  2. Mass, left upper thigh.   PROCEDURES:  1. Excision of mass, left upper arm.  2. Incisional biopsy of mass, left thigh.   SURGEON:  Kathrin Penner, M.D.   ASSISTANT:  Nurse.   ANESTHESIA:  Local, I used 1% lidocaine plain.   NOTE:  Ms. Fornshell is a 65 year old patient with a mass of the left upper arm  which has been increasing in size but not causing any significant pain or  discomfort.  This has been present only recently, but she also has a mass of  the upper portion of the left thigh which has been present for several  months and is somewhat more excoriated and flattened onto the skin.  She  requests removal or biopsy of these masses to determine their etiology.   PROCEDURE:  With the patient positioned supinely, first the left upper arm  is prepped and draped to be included in a sterile operative field.  The  region around the mass is infiltrated with 1% Xylocaine plain.  I made an  elliptical incision transversely across the mass, deepening this through the  skin and subcutaneous tissue, and excising the mass in its entirety.  Hemostasis was obtained by suture ligatures of 3-0 Vicryl and then the  subcutaneous tissues were closed with interrupted 3-0 Vicryl sutures, the  skin was closed with a 4-0 Monocryl running subcuticular stitch and then  reinforced with Steri-Strips.  Attention was then turned to the mass of the  left upper thigh, in which the area was again prepped and draped to be  included in the sterile operative  field and then infiltrated with 1%  lidocaine.  I used an elliptical incision to make an incisional biopsy by  removing a wedge of this tissue down to normal subcutaneous fat, extending  it across the entire lesion and removing it and forwarding it for pathologic  evaluation.  I then used deep interrupted 3-0 nylon stitches to  reapproximate the wound, placed in interrupted vertical mattress fashion.  Sterile dressings were then placed on both of the wounds and the patient  removed from the operating room to the recovery area in stable condition.  She tolerated the procedure well.  Kathrin Penner, M.D.    PB/MEDQ  D:  06/09/2003  T:  06/10/2003  Job:  [id number]

## 2010-07-08 NOTE — Procedures (Signed)
[place]  Patient:    Amanda Rivers, Amanda Rivers                      MRN: [id number] Proc. Date: 10/20/99 Adm. Date:  [id number] Attending:  Woody Seller CC:         Elyn Peers, M.D.                           Procedure Report  REFERRING PHYSICIAN:  Elyn Peers, M.D.  PREOPERATIVE DIAGNOSIS:  Blood in stools.  POSTOPERATIVE DIAGNOSIS: 1. Nonspecific focal inflammation in the colon. 2. Scattered diverticula in the descending colon. 3. Small polypoid lesion at the rectosigmoid area removed via hot biopsy.  PROCEDURE PERFORMED:  Colonoscopy and hot biopsy.  MEDICATIONS USED:  Demerol 100 mg IV, Versed 10 mg IV over a 10-minute period of time.  INSTRUMENT USED:  Olympus video colonoscope.  ENDOSCOPIST:  Katherina Mires, M.D.  INDICATIONS:  This pleasant 65 year old female was referred for evaluation because of blood in stools that was present.  She has been relatively asymptomatic without any major discomforts that was present and has noticed some blood that was present and has noticed some blood that was present.  It was confirmed by her primary physician and she was subsequently sent for evaluation of this process.  There is no history of any colon neoplasms, no history of any polypoid lesions or diverticula problems that was noted.  OBJECTIVE FINDINGS:  She is a pleasant female who appears to be in no distress.  Her vital signs are stable.  The HEENT examination was anicteric. Neck was supple.  Lungs were clear.  Heart had a regular rate and rhythm without heaves, thrills, murmurs or gallop.  The abdomen was soft, no tenderness, no hepatosplenomegaly.  The extremities were within normal limits.  PLAN:  To proceed with the colonoscopic examination.  INFORMED CONSENT:  The patient was advised of the procedure, the indications and the risks involved.  The patient has agreed to have the procedure performed.  A video was reviewed and the  consent form was obtained.  PREOPERATIVE PREPARATION:  The patient was brought to the endoscopy unit where an IV for IV sedating medication was started.  A monitor was placed on the patient to monitor the patients vital signs and oxygen saturation.  Nasal oxygen at two liters a minute was used and after adequate sedation was performed, the procedure was begun.  BOWEL PREP:  The patient was given GoLYTELY and Reglan as a bowel prep at this time.  The patient tolerated the prep well without any complications.  The quality of the prep was excellent.  DESCRIPTION OF PROCEDURE:  The instrument was advanced with the patient lying in the left lateral position to approximately 85 cm in the proximal colon to the cecum.  This was confirmed by palpation, transillumination as well as visualization of the appendiceal orifice that was noted.  There appeared to be evidence of a small polypoid lesion at approximately 30 cm in the rectosigmoid area at this time.  There was only minimum discolorations that was noted.  This polyp was removed via hot biopsy. Otherwise, there was no other masses, polyps or stricture lesions appreciated.  The vascular pattern appeared to be well within normal limits throughout the entire colon.  The mucosal pattern showed evidence of scattered diverticula in the descending colon that was appreciated at this time.  There was also  small inflammatory changes that was noted in the rectosigmoid area as well as in various spots of the colon at this time.  The inflammatory process was mild but photographs were taken of some of the areas that was noted.  There was no increased tortuosity of the colon and the instrument was able to traverse all the way to the cecal region without difficulty.  There was no evidence of any internal or external hemorrhoids upon exiting from the area.   The patient tolerated the procedure well.  TREATMENT: 1. Await the results of the pathology report  at this time. 2. Recommend repeating the procedure in one year to a year and a half. 3. Conservative management during the interim. 4. If possibly Proctocream/Proctofoam with hydrocortisone for the rectosigmoid    area at this time and depending on the results to determine the course of    therapy. DD:  10/20/99 TD:  10/21/99 Job: [id number]

## 2010-07-08 NOTE — Letter (Signed)
February 24, 2007   Ernestene Kiel, M.D.  [address], [place] [address]   Re:  [name], [name]               DOB:  [birth year]   Dear Dr. Katherine Roan:   I saw Amanda Rivers back today.  It is now approximately 9 months since we  resected her right upper lobe lesion.  We did get a CT scan to look for  any evidence of recurrence and there was no evidence of any early  recurrence.   She is doing well overall.  Her weight has been stable.  Blood pressure  was 188/116.  Pulse 95.  Respirations 18.  Sats are 95-99%.  The  incisions are well healed.   I plan to see her back in 4 months and we will probably repeat a chest x-  ray at that time.  I appreciate the opportunity to participate in the  care of Ms. Amanda Rivers.   Sincerely,    Nicanor Alcon, M.D.   Nicanor Alcon, M.D.  Electronically Signed   DPB/MEDQ  D:  02/24/2007  T:  02/24/2007  Job:  [id number]   cc:   Marti Sleigh, M.D.

## 2010-07-08 NOTE — Discharge Summary (Signed)
NAMEMALEIGHA, FOWLER             ACCOUNT NO.:  [id number]   MEDICAL RECORD NO.:  [id number]          PATIENT TYPE:  INP   LOCATION:  2040                         FACILITY:  [facility]   PHYSICIAN:  Nicanor Alcon, M.D. DATE OF BIRTH:  20-Mar-1945   DATE OF ADMISSION:  06/21/2006  DATE OF DISCHARGE:  06/30/2006                               DISCHARGE SUMMARY   HISTORY OF PRESENT ILLNESS:  The patient is a 65 year old female  referred to Dr. Arlyce Dice secondary to findings of a right upper lobe lung  mass.  This mass was positive on PET scan with SUV of 4.2.  The lesion  was 2 cm in size.  There is no evidence of hilar or mediastinal  adenopathy.  There are also some small, small lesions noted on left  lower lobe.  She is also noted to be undergoing a workup for a pelvic  mass by Dr. Jennefer Bravo.  She was admitted this hospitalization  for resection.   PAST MEDICAL HISTORY:  Hypertension.   ALLERGIES:  No known drug allergies.   MEDICATIONS:  Prior to admission:  1. Norvasc 10 mg daily.  2. Nexium 40 mg daily.  3. Hydrochlorothiazide 25 mg daily.  4. Lexapro 10 mg daily.  5. Lunesta p.r.n. at night.  6. Advair 250/50 twice daily.   Family history, social history, review of systems and physical exam,  please see the history and physical done on admission.   HOSPITAL COURSE:  The patient was admitted on Jun 21, 2006, taken the  operating room at which time she underwent a right video-assisted  thoracoscopy with right upper lobe lobectomy and lymph node sampling.  She tolerated procedure well was taken the [facility] in  stable condition.  Postoperative hospital course:  The patient has shown  a good and gradual progression.  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  There was no residual tumor found in the right upper lobe resection.  All lymph nodes sampled through the procedure showed no evidence  of  metastatic carcinoma.  She removed progressed throughout hospitalization  in a routine manner.  All routine lines, monitors and drainage devices  were discontinued in the standard fashion.  Her pulmonary status did  show a good and gradual improvement with some evidence of bronchitis  earlier in the recovery.  Her overall status was deemed to be acceptable  for discharge on Jun 30, 2006.   FINAL DIAGNOSIS:  Adenocarcinoma, moderately differentiated, status post  right upper lobe lobectomy as described.   OTHER DIAGNOSES:  As previously listed per the history to include:  1. Hypertension.  2. Chronic obstructive pulmonary disease.  3. History of a pelvic mass being evaluated.  4. History of tobacco use and postoperative bronchitis.   DISCHARGE MEDICATIONS:  1. Nicoderm CCU p.r.n.  2. Diflucan 100 mg daily for three additional days.  3. Tylox one to two every 4 hours as needed.  4. Norvasc 10 mg daily.  5. Cardura 4 mg nightly.  6. Nexium 40 mg daily.  7. Lexapro 10 mg daily.  She is instructed to stop taking medications Benicar and  hydrochlorothiazide.   FOLLOW UP:  follow-up with Dr. Arlyce Dice 1 week post discharge with the  office to arrange to include a chest x-ray.   CONDITION ON DISCHARGE:  Stable and improved.      John Giovanni, P.A.-C.      Nicanor Alcon, M.D.  Electronically Signed    WEG/MEDQ  D:  08/15/2006  T:  08/16/2006  Job:  [id number]

## 2010-10-20 ENCOUNTER — Other Ambulatory Visit: Payer: Self-pay | Admitting: Thoracic Surgery

## 2010-10-20 DIAGNOSIS — C3410 Malignant neoplasm of upper lobe, unspecified bronchus or lung: Secondary | ICD-10-CM

## 2010-11-01 ENCOUNTER — Encounter: Payer: Self-pay | Admitting: Thoracic Surgery

## 2010-11-01 ENCOUNTER — Ambulatory Visit
Admission: RE | Admit: 2010-11-01 | Discharge: 2010-11-01 | Disposition: A | Discharge status: Home / Self Care | Payer: Medicare Other | Source: Ambulatory Visit | Attending: Thoracic Surgery | Admitting: Thoracic Surgery

## 2010-11-01 ENCOUNTER — Encounter: Payer: Self-pay | Admitting: *Deleted

## 2010-11-01 ENCOUNTER — Ambulatory Visit (INDEPENDENT_AMBULATORY_CARE_PROVIDER_SITE_OTHER): Payer: Medicare Other | Admitting: Thoracic Surgery

## 2010-11-01 VITALS — BP 149/91 | HR 88 | Resp 18 | Ht 71.0 in | Wt 175.0 lb

## 2010-11-01 DIAGNOSIS — C3490 Malignant neoplasm of unspecified part of unspecified bronchus or lung: Secondary | ICD-10-CM

## 2010-11-01 DIAGNOSIS — C3410 Malignant neoplasm of upper lobe, unspecified bronchus or lung: Secondary | ICD-10-CM

## 2010-11-01 NOTE — Progress Notes (Signed)
HPI patient returns four and  a half years since her surgery. CT scan shows no evidence of recurrence of her cancer. We'll we will refer her back to her medical doctor Dr. Marin Comment see her again if she develops any further pulmonary problems.    Current Outpatient Prescriptions  Medication Sig Dispense Refill  . amLODipine-olmesartan (AZOR) 10-40 MG per tablet Take 1 tablet by mouth daily.        Marland Kitchen dexlansoprazole (DEXILANT) 60 MG capsule Take 60 mg by mouth daily.        . nebivolol (BYSTOLIC) 10 MG tablet Take 10 mg by mouth daily.        Marland Kitchen spironolactone (ALDACTONE) 25 MG tablet Take 25 mg by mouth 2 (two) times daily.           Review of Systems: No change  Physical Exam  Constitutional: She appears well-developed and well-nourished.  Cardiovascular: Normal rate, regular rhythm and normal heart sounds.   Pulmonary/Chest: Effort normal and breath sounds normal.     Diagnostic Tests: CT scan negative for cancer   Impression: Status post wedge resection of adenocarcinoma.   Plan: Followup with Dr. Criss Rosales

## 2010-12-05 LAB — COMPREHENSIVE METABOLIC PANEL
ALT: 19
AST: 22
Albumin: 3.8
Alkaline Phosphatase: 83
BUN: 12
CO2: 28
Calcium: 9.2
Chloride: 102
Creatinine, Ser: 1.19
GFR calc Af Amer: 56 — ABNORMAL LOW
GFR calc non Af Amer: 46 — ABNORMAL LOW
Glucose, Bld: 103 — ABNORMAL HIGH
Potassium: 4.1
Sodium: 139
Total Bilirubin: 0.6
Total Protein: 7.4

## 2010-12-05 LAB — BASIC METABOLIC PANEL
BUN: 3 — ABNORMAL LOW
BUN: 9
BUN: <1 — ABNORMAL LOW
CO2: 24
CO2: 24
CO2: 27
Calcium: 7.6 — ABNORMAL LOW
Calcium: 7.8 — ABNORMAL LOW
Calcium: 8.5
Chloride: 101
Chloride: 103
Chloride: 109
Creatinine, Ser: 1.08
Creatinine, Ser: 1.37 — ABNORMAL HIGH
Creatinine, Ser: 1.73 — ABNORMAL HIGH
GFR calc Af Amer: 36 — ABNORMAL LOW
GFR calc Af Amer: 47 — ABNORMAL LOW
GFR calc Af Amer: >60
GFR calc non Af Amer: 30 — ABNORMAL LOW
GFR calc non Af Amer: 39 — ABNORMAL LOW
GFR calc non Af Amer: 52 — ABNORMAL LOW
Glucose, Bld: 104 — ABNORMAL HIGH
Glucose, Bld: 112 — ABNORMAL HIGH
Glucose, Bld: 136 — ABNORMAL HIGH
Potassium: 3.4 — ABNORMAL LOW
Potassium: 3.6
Potassium: 3.6
Sodium: 129 — ABNORMAL LOW
Sodium: 131 — ABNORMAL LOW
Sodium: 140

## 2010-12-05 LAB — URINE CULTURE
Colony Count: >=100000
Special Requests: NEGATIVE

## 2010-12-05 LAB — CBC
HCT: 27.7 — ABNORMAL LOW
HCT: 28.5 — ABNORMAL LOW
HCT: 29.2 — ABNORMAL LOW
HCT: 37.1
Hemoglobin: 12.5
Hemoglobin: 9.5 — ABNORMAL LOW
Hemoglobin: 9.8 — ABNORMAL LOW
Hemoglobin: 9.9 — ABNORMAL LOW
MCHC: 33.6
MCHC: 34.1
MCHC: 34.2
MCHC: 34.4
MCV: 82.7
MCV: 83.2
MCV: 83.4
MCV: 83.5
Platelets: 133 — ABNORMAL LOW
Platelets: 140 — ABNORMAL LOW
Platelets: 153
Platelets: 199
RBC: 3.35 — ABNORMAL LOW
RBC: 3.42 — ABNORMAL LOW
RBC: 3.51 — ABNORMAL LOW
RBC: 4.45
RDW: 14.7 — ABNORMAL HIGH
RDW: 14.7 — ABNORMAL HIGH
RDW: 15 — ABNORMAL HIGH
RDW: 15.7 — ABNORMAL HIGH
WBC: 4
WBC: 5.5
WBC: 6.7
WBC: 8.7

## 2010-12-05 LAB — DIFFERENTIAL
Basophils Absolute: 0
Basophils Relative: 0
Eosinophils Absolute: 0.1
Eosinophils Relative: 2
Lymphocytes Relative: 36
Lymphs Abs: 1.4
Monocytes Absolute: 0.4
Monocytes Relative: 10
Neutro Abs: 2
Neutrophils Relative %: 51

## 2010-12-05 LAB — URINALYSIS, ROUTINE W REFLEX MICROSCOPIC
Bilirubin Urine: NEGATIVE
Glucose, UA: NEGATIVE
Hgb urine dipstick: NEGATIVE
Ketones, ur: NEGATIVE
Nitrite: NEGATIVE
Protein, ur: NEGATIVE
Specific Gravity, Urine: 1.009
Urobilinogen, UA: 0.2
pH: 5.5

## 2010-12-05 LAB — ABO/RH: ABO/RH(D): O POS

## 2010-12-05 LAB — TYPE AND SCREEN
ABO/RH(D): O POS
Antibody Screen: NEGATIVE

## 2011-06-08 DIAGNOSIS — F411 Generalized anxiety disorder: Secondary | ICD-10-CM | POA: Diagnosis not present

## 2011-06-08 DIAGNOSIS — I10 Essential (primary) hypertension: Secondary | ICD-10-CM | POA: Diagnosis not present

## 2011-06-08 DIAGNOSIS — E119 Type 2 diabetes mellitus without complications: Secondary | ICD-10-CM | POA: Diagnosis not present

## 2011-06-08 DIAGNOSIS — F329 Major depressive disorder, single episode, unspecified: Secondary | ICD-10-CM | POA: Diagnosis not present

## 2011-06-08 DIAGNOSIS — M1250 Traumatic arthropathy, unspecified site: Secondary | ICD-10-CM | POA: Diagnosis not present

## 2011-08-14 DIAGNOSIS — H2510 Age-related nuclear cataract, unspecified eye: Secondary | ICD-10-CM | POA: Diagnosis not present

## 2011-08-14 DIAGNOSIS — H538 Other visual disturbances: Secondary | ICD-10-CM | POA: Diagnosis not present

## 2011-09-06 DIAGNOSIS — T23009A Burn of unspecified degree of unspecified hand, unspecified site, initial encounter: Secondary | ICD-10-CM | POA: Diagnosis not present

## 2011-09-06 DIAGNOSIS — T2004XA Burn of unspecified degree of nose (septum), initial encounter: Secondary | ICD-10-CM | POA: Diagnosis not present

## 2011-09-13 DIAGNOSIS — T23009A Burn of unspecified degree of unspecified hand, unspecified site, initial encounter: Secondary | ICD-10-CM | POA: Diagnosis not present

## 2011-09-20 DIAGNOSIS — T23009A Burn of unspecified degree of unspecified hand, unspecified site, initial encounter: Secondary | ICD-10-CM | POA: Diagnosis not present

## 2011-09-30 DIAGNOSIS — T23009A Burn of unspecified degree of unspecified hand, unspecified site, initial encounter: Secondary | ICD-10-CM | POA: Diagnosis not present

## 2011-10-31 DIAGNOSIS — T23009A Burn of unspecified degree of unspecified hand, unspecified site, initial encounter: Secondary | ICD-10-CM | POA: Diagnosis not present

## 2011-10-31 DIAGNOSIS — M1250 Traumatic arthropathy, unspecified site: Secondary | ICD-10-CM | POA: Diagnosis not present

## 2011-10-31 DIAGNOSIS — I10 Essential (primary) hypertension: Secondary | ICD-10-CM | POA: Diagnosis not present

## 2011-10-31 DIAGNOSIS — F411 Generalized anxiety disorder: Secondary | ICD-10-CM | POA: Diagnosis not present

## 2011-11-10 ENCOUNTER — Ambulatory Visit: Payer: Medicare Other | Attending: Family Medicine | Admitting: *Deleted

## 2011-11-10 DIAGNOSIS — M6281 Muscle weakness (generalized): Secondary | ICD-10-CM | POA: Diagnosis not present

## 2011-11-10 DIAGNOSIS — M2560 Stiffness of unspecified joint, not elsewhere classified: Secondary | ICD-10-CM | POA: Diagnosis not present

## 2011-11-10 DIAGNOSIS — IMO0001 Reserved for inherently not codable concepts without codable children: Secondary | ICD-10-CM | POA: Insufficient documentation

## 2011-11-10 DIAGNOSIS — M25649 Stiffness of unspecified hand, not elsewhere classified: Secondary | ICD-10-CM | POA: Insufficient documentation

## 2011-11-10 DIAGNOSIS — R279 Unspecified lack of coordination: Secondary | ICD-10-CM | POA: Insufficient documentation

## 2011-11-10 DIAGNOSIS — M2550 Pain in unspecified joint: Secondary | ICD-10-CM | POA: Insufficient documentation

## 2011-11-21 ENCOUNTER — Ambulatory Visit: Payer: Medicare Other | Attending: Family Medicine | Admitting: Occupational Therapy

## 2011-11-21 DIAGNOSIS — M6281 Muscle weakness (generalized): Secondary | ICD-10-CM | POA: Insufficient documentation

## 2011-11-21 DIAGNOSIS — M25649 Stiffness of unspecified hand, not elsewhere classified: Secondary | ICD-10-CM | POA: Diagnosis not present

## 2011-11-21 DIAGNOSIS — IMO0001 Reserved for inherently not codable concepts without codable children: Secondary | ICD-10-CM | POA: Diagnosis not present

## 2011-11-21 DIAGNOSIS — R279 Unspecified lack of coordination: Secondary | ICD-10-CM | POA: Insufficient documentation

## 2011-11-21 DIAGNOSIS — M2550 Pain in unspecified joint: Secondary | ICD-10-CM | POA: Diagnosis not present

## 2011-11-21 DIAGNOSIS — M2560 Stiffness of unspecified joint, not elsewhere classified: Secondary | ICD-10-CM | POA: Diagnosis not present

## 2011-11-23 ENCOUNTER — Encounter: Payer: Medicare Other | Admitting: Occupational Therapy

## 2011-11-27 ENCOUNTER — Ambulatory Visit: Payer: Medicare Other | Admitting: Occupational Therapy

## 2011-11-27 DIAGNOSIS — R279 Unspecified lack of coordination: Secondary | ICD-10-CM | POA: Diagnosis not present

## 2011-11-27 DIAGNOSIS — IMO0001 Reserved for inherently not codable concepts without codable children: Secondary | ICD-10-CM | POA: Diagnosis not present

## 2011-11-27 DIAGNOSIS — M2550 Pain in unspecified joint: Secondary | ICD-10-CM | POA: Diagnosis not present

## 2011-11-27 DIAGNOSIS — M6281 Muscle weakness (generalized): Secondary | ICD-10-CM | POA: Diagnosis not present

## 2011-11-27 DIAGNOSIS — M2560 Stiffness of unspecified joint, not elsewhere classified: Secondary | ICD-10-CM | POA: Diagnosis not present

## 2011-11-27 DIAGNOSIS — M25649 Stiffness of unspecified hand, not elsewhere classified: Secondary | ICD-10-CM | POA: Diagnosis not present

## 2011-11-29 ENCOUNTER — Encounter: Payer: Medicare Other | Admitting: Occupational Therapy

## 2011-12-04 ENCOUNTER — Ambulatory Visit: Payer: Medicare Other | Admitting: Occupational Therapy

## 2011-12-04 DIAGNOSIS — M2560 Stiffness of unspecified joint, not elsewhere classified: Secondary | ICD-10-CM | POA: Diagnosis not present

## 2011-12-04 DIAGNOSIS — M2550 Pain in unspecified joint: Secondary | ICD-10-CM | POA: Diagnosis not present

## 2011-12-04 DIAGNOSIS — R279 Unspecified lack of coordination: Secondary | ICD-10-CM | POA: Diagnosis not present

## 2011-12-04 DIAGNOSIS — M25649 Stiffness of unspecified hand, not elsewhere classified: Secondary | ICD-10-CM | POA: Diagnosis not present

## 2011-12-04 DIAGNOSIS — IMO0001 Reserved for inherently not codable concepts without codable children: Secondary | ICD-10-CM | POA: Diagnosis not present

## 2011-12-04 DIAGNOSIS — M6281 Muscle weakness (generalized): Secondary | ICD-10-CM | POA: Diagnosis not present

## 2011-12-06 ENCOUNTER — Encounter: Payer: Medicare Other | Admitting: Occupational Therapy

## 2011-12-11 ENCOUNTER — Encounter: Payer: Medicare Other | Admitting: Occupational Therapy

## 2011-12-13 ENCOUNTER — Encounter: Payer: Medicare Other | Admitting: Occupational Therapy

## 2011-12-14 DIAGNOSIS — H40059 Ocular hypertension, unspecified eye: Secondary | ICD-10-CM | POA: Diagnosis not present

## 2012-02-18 IMAGING — CT CT CHEST W/O CM
3 of 4 series · 17 of 30 positions shown, 19 images · non-contrast
Comparison: CT chest dated 12/15/2008

CLINICAL DATA: Lung cancer diagnosed 3666, status post right lung
surgery, follow-up

CT CHEST WITHOUT CONTRAST
TECHNIQUE: Multidetector CT imaging of the chest was performed
following the standard protocol without IV contrast.

[Series 3: chest w/o · axial · non-contrast · 0.58mm/px · z∈[-268,-64]mm · 5 of 63 slices shown, 7 images]
[im 11/63  mediastinal]
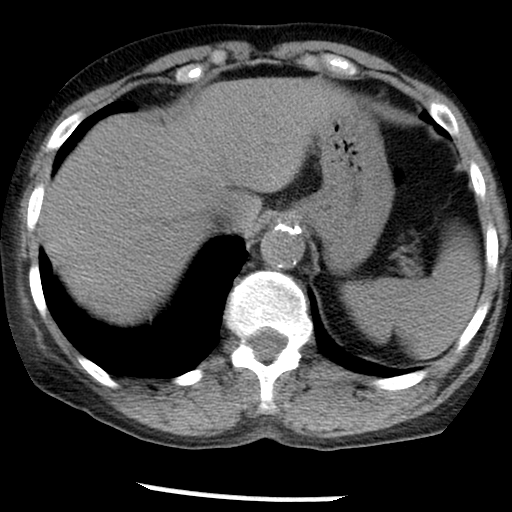
[im 11/63  lung]
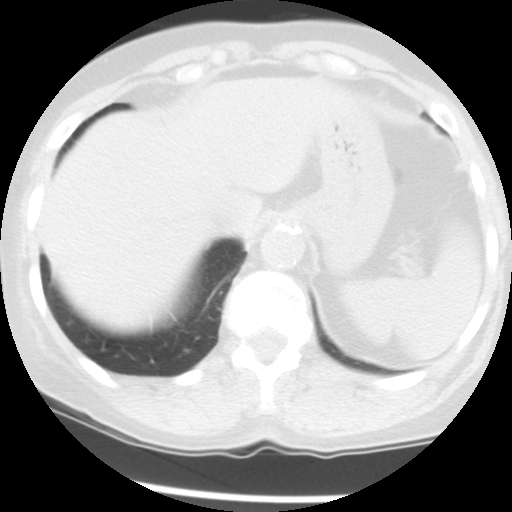
[im 21/63  lung]
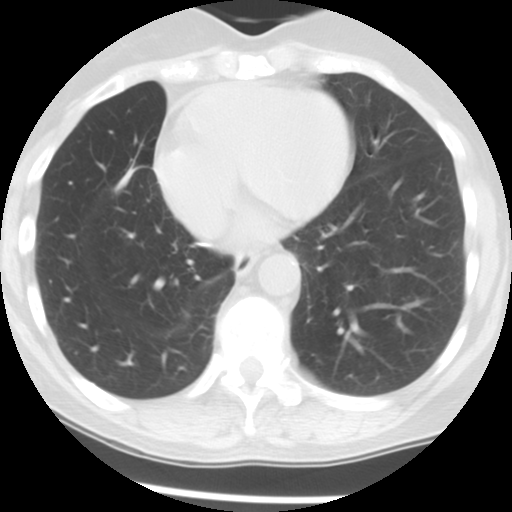
[im 32/63  lung]
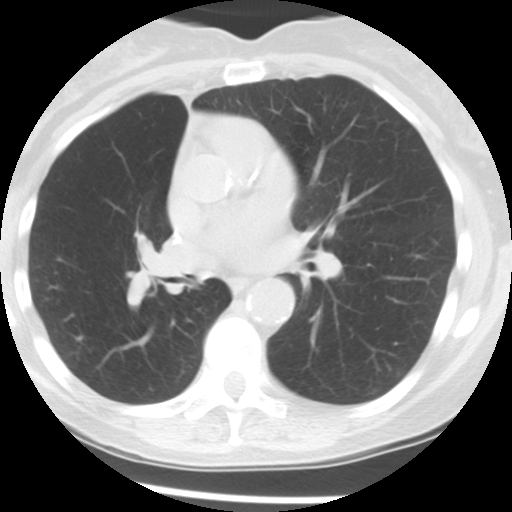
[im 42/63  lung]
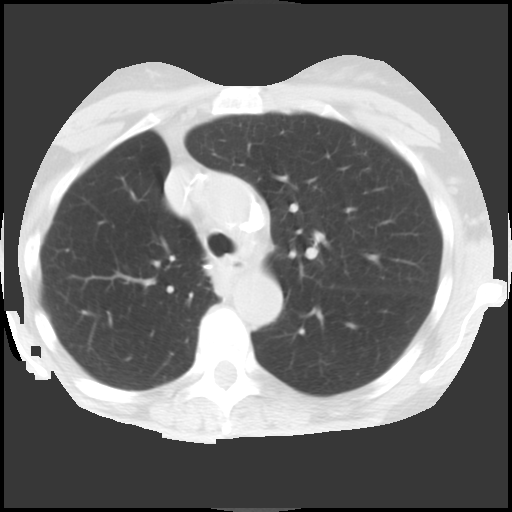
[im 52/63  mediastinal]
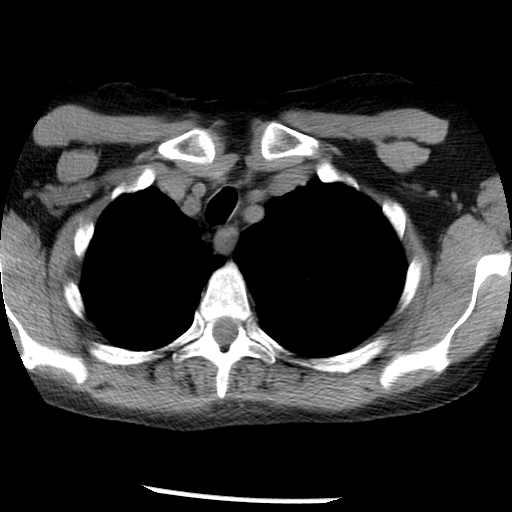
[im 52/63  lung]
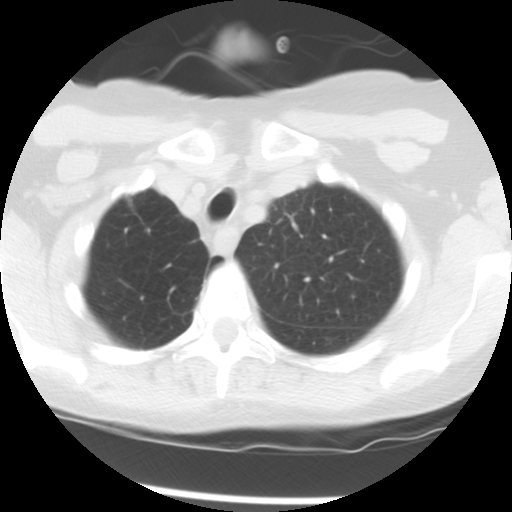

[Series 4: lung windows · axial · 0.58mm/px · z∈[-268,-64]mm · 5 of 63 slices shown]
[im 11/63  lung]
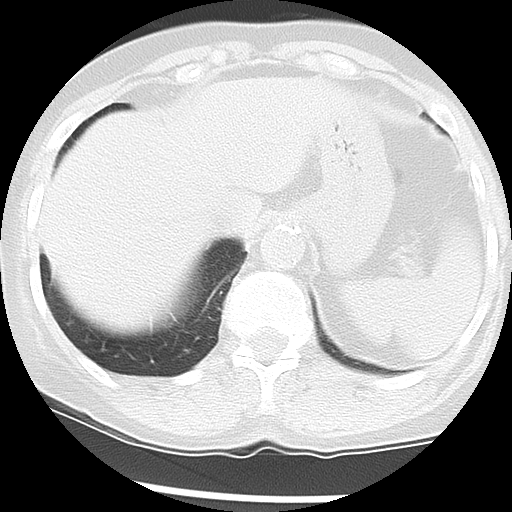
[im 21/63  lung]
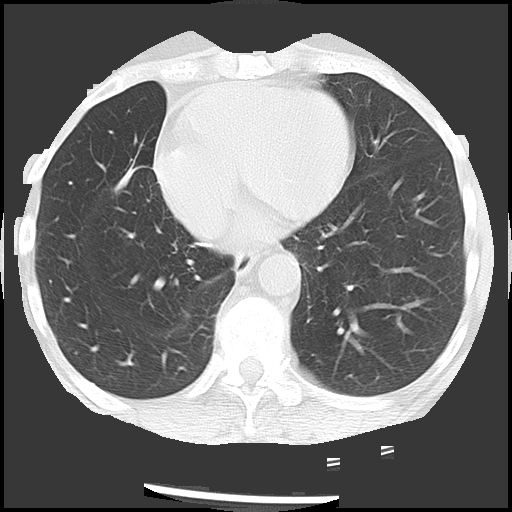
[im 32/63  lung]
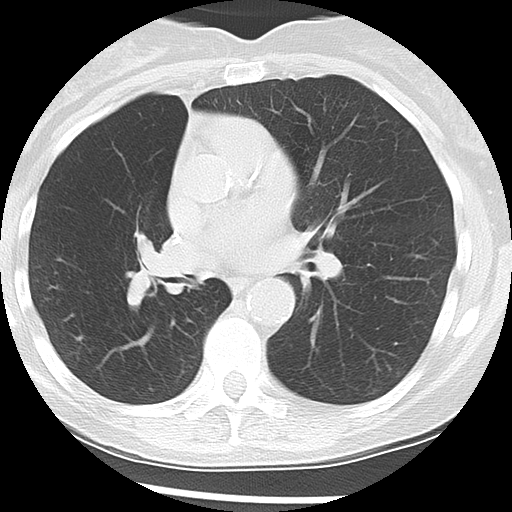
[im 42/63  lung]
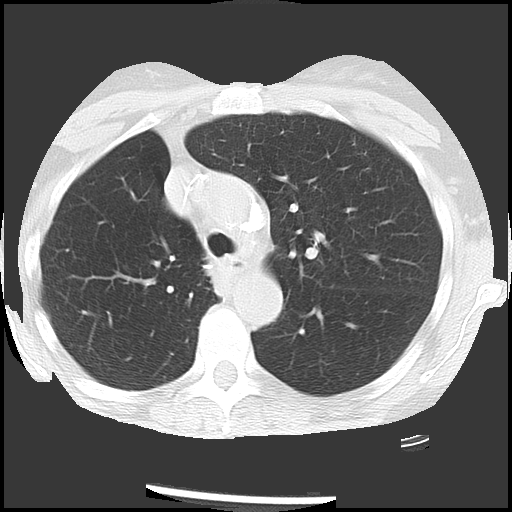
[im 52/63  lung]
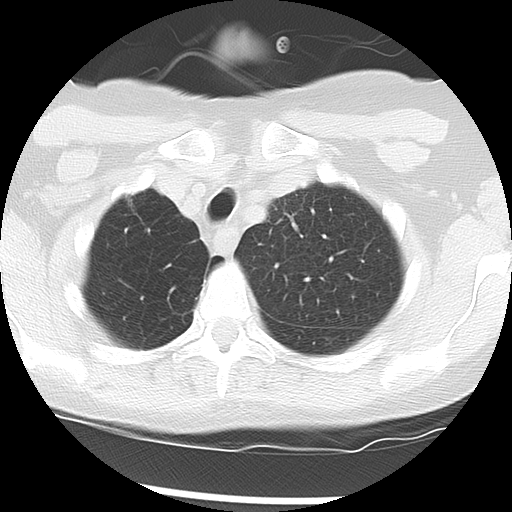

[Series 602: sagittal body · sagittal · 0.61mm/px · 7 of 121 slices shown]
[im 10/121  mediastinal]
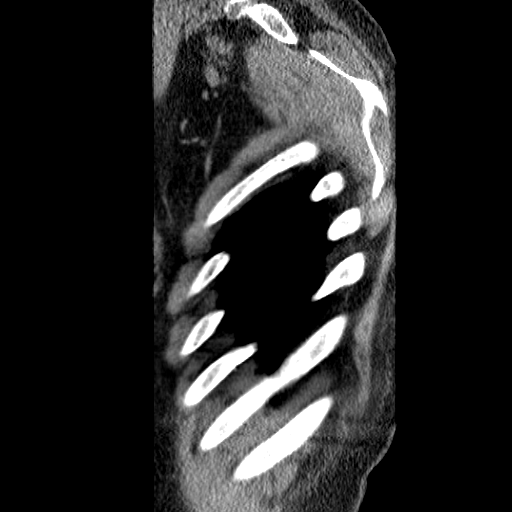
[im 28/121  mediastinal]
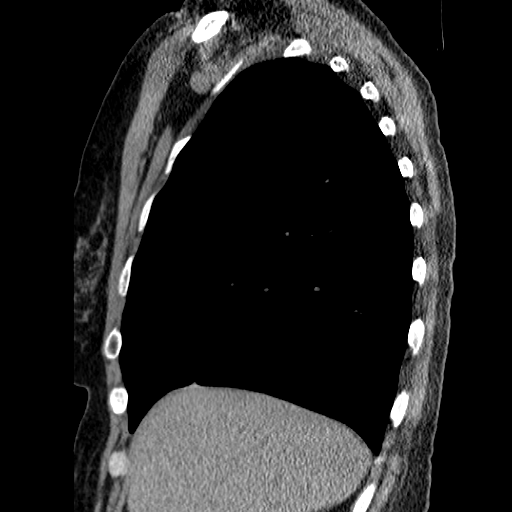
[im 37/121  mediastinal]
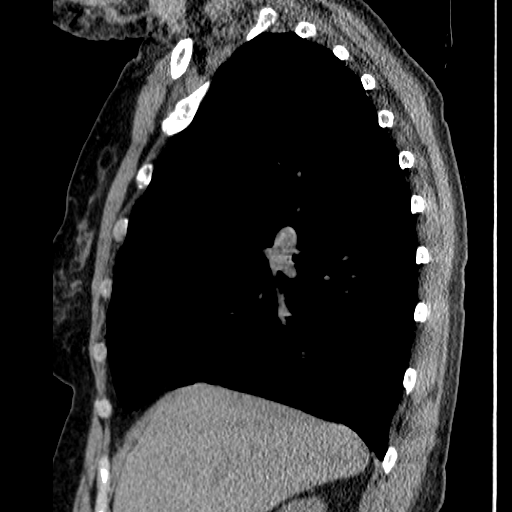
[im 56/121  mediastinal]
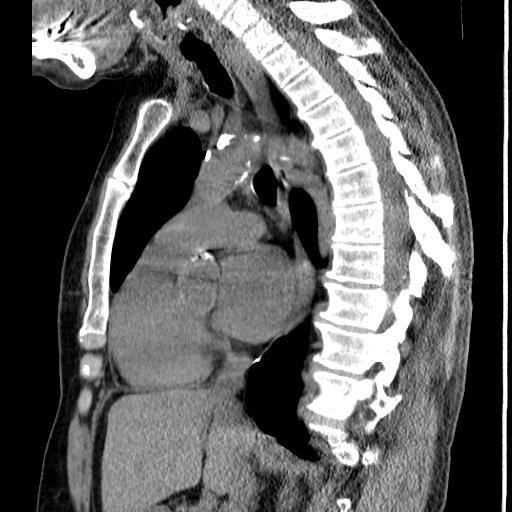
[im 65/121  mediastinal]
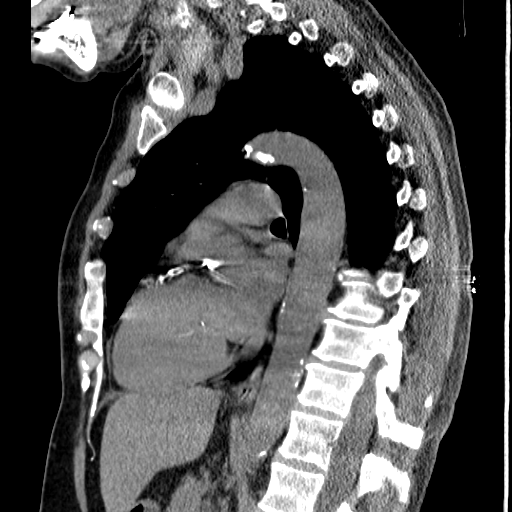
[im 84/121  mediastinal]
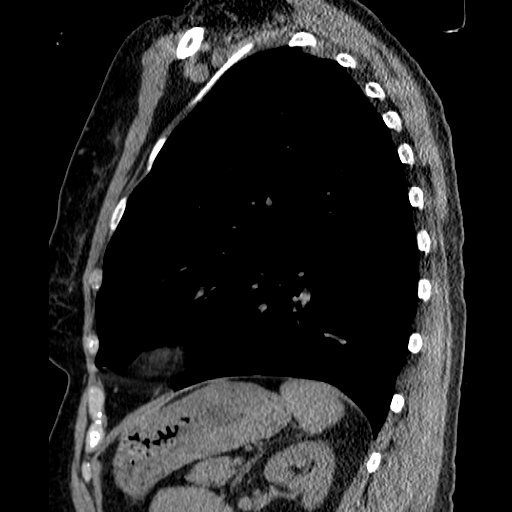
[im 93/121  mediastinal]
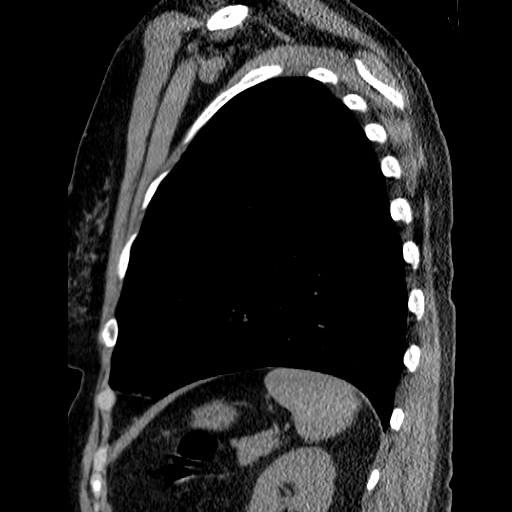

[17 of 30 positions shown; findings below may reference images not displayed]

FINDINGS: Visualized thyroid is unremarkable.

Status post right upper lobectomy.

Emphysematous changes.   Mild scarring at the right lateral lung
base. Stable mild scarring in the medial left upper lobe (series
4/image 15).  No suspicious pulmonary nodules.   No pleural
effusion or pneumothorax.

The heart is normal in size.  Coronary atherosclerosis.
Atherosclerotic calcifications of the aortic arch.

No suspicious mediastinal or axillary lymphadenopathy.

Visualized upper abdomen is grossly unremarkable.

Mild degenerative changes of the visualized thoracolumbar spine.
No focal osseous lesions.
IMPRESSION: Status post right upper lobectomy.  No evidence of recurrent or
metastatic disease in the chest.

## 2012-02-28 DIAGNOSIS — E7800 Pure hypercholesterolemia, unspecified: Secondary | ICD-10-CM | POA: Diagnosis not present

## 2012-02-28 DIAGNOSIS — I10 Essential (primary) hypertension: Secondary | ICD-10-CM | POA: Diagnosis not present

## 2012-06-13 DIAGNOSIS — H40059 Ocular hypertension, unspecified eye: Secondary | ICD-10-CM | POA: Diagnosis not present

## 2012-07-03 DIAGNOSIS — E7800 Pure hypercholesterolemia, unspecified: Secondary | ICD-10-CM | POA: Diagnosis not present

## 2012-07-03 DIAGNOSIS — J309 Allergic rhinitis, unspecified: Secondary | ICD-10-CM | POA: Diagnosis not present

## 2012-07-03 DIAGNOSIS — I10 Essential (primary) hypertension: Secondary | ICD-10-CM | POA: Diagnosis not present

## 2012-11-01 DIAGNOSIS — E119 Type 2 diabetes mellitus without complications: Secondary | ICD-10-CM | POA: Diagnosis not present

## 2012-11-01 DIAGNOSIS — I10 Essential (primary) hypertension: Secondary | ICD-10-CM | POA: Diagnosis not present

## 2012-11-01 DIAGNOSIS — E7800 Pure hypercholesterolemia, unspecified: Secondary | ICD-10-CM | POA: Diagnosis not present

## 2012-11-12 DIAGNOSIS — Z23 Encounter for immunization: Secondary | ICD-10-CM | POA: Diagnosis not present

## 2012-11-12 DIAGNOSIS — E7800 Pure hypercholesterolemia, unspecified: Secondary | ICD-10-CM | POA: Diagnosis not present

## 2013-02-03 DIAGNOSIS — E119 Type 2 diabetes mellitus without complications: Secondary | ICD-10-CM | POA: Diagnosis not present

## 2013-02-03 DIAGNOSIS — I10 Essential (primary) hypertension: Secondary | ICD-10-CM | POA: Diagnosis not present

## 2013-02-07 DIAGNOSIS — E7800 Pure hypercholesterolemia, unspecified: Secondary | ICD-10-CM | POA: Diagnosis not present

## 2013-02-07 DIAGNOSIS — I10 Essential (primary) hypertension: Secondary | ICD-10-CM | POA: Diagnosis not present

## 2013-02-07 DIAGNOSIS — R7309 Other abnormal glucose: Secondary | ICD-10-CM | POA: Diagnosis not present

## 2013-03-07 DIAGNOSIS — E7800 Pure hypercholesterolemia, unspecified: Secondary | ICD-10-CM | POA: Diagnosis not present

## 2013-03-07 DIAGNOSIS — R7309 Other abnormal glucose: Secondary | ICD-10-CM | POA: Diagnosis not present

## 2013-03-07 DIAGNOSIS — I10 Essential (primary) hypertension: Secondary | ICD-10-CM | POA: Diagnosis not present

## 2013-06-11 DIAGNOSIS — E119 Type 2 diabetes mellitus without complications: Secondary | ICD-10-CM | POA: Diagnosis not present

## 2013-06-11 DIAGNOSIS — E7800 Pure hypercholesterolemia, unspecified: Secondary | ICD-10-CM | POA: Diagnosis not present

## 2013-06-11 DIAGNOSIS — E781 Pure hyperglyceridemia: Secondary | ICD-10-CM | POA: Diagnosis not present

## 2013-06-11 DIAGNOSIS — I10 Essential (primary) hypertension: Secondary | ICD-10-CM | POA: Diagnosis not present

## 2013-10-15 DIAGNOSIS — I10 Essential (primary) hypertension: Secondary | ICD-10-CM | POA: Diagnosis not present

## 2013-10-15 DIAGNOSIS — E119 Type 2 diabetes mellitus without complications: Secondary | ICD-10-CM | POA: Diagnosis not present

## 2014-02-18 DIAGNOSIS — E119 Type 2 diabetes mellitus without complications: Secondary | ICD-10-CM | POA: Diagnosis not present

## 2014-02-18 DIAGNOSIS — I10 Essential (primary) hypertension: Secondary | ICD-10-CM | POA: Diagnosis not present

## 2014-02-18 DIAGNOSIS — F064 Anxiety disorder due to known physiological condition: Secondary | ICD-10-CM | POA: Diagnosis not present

## 2014-02-18 DIAGNOSIS — E780 Pure hypercholesterolemia: Secondary | ICD-10-CM | POA: Diagnosis not present

## 2014-02-24 DIAGNOSIS — Z90710 Acquired absence of both cervix and uterus: Secondary | ICD-10-CM | POA: Diagnosis not present

## 2014-02-24 DIAGNOSIS — M8589 Other specified disorders of bone density and structure, multiple sites: Secondary | ICD-10-CM | POA: Diagnosis not present

## 2014-02-24 DIAGNOSIS — Z1231 Encounter for screening mammogram for malignant neoplasm of breast: Secondary | ICD-10-CM | POA: Diagnosis not present

## 2014-06-19 DIAGNOSIS — R7309 Other abnormal glucose: Secondary | ICD-10-CM | POA: Diagnosis not present

## 2014-06-19 DIAGNOSIS — E780 Pure hypercholesterolemia: Secondary | ICD-10-CM | POA: Diagnosis not present

## 2014-06-19 DIAGNOSIS — F064 Anxiety disorder due to known physiological condition: Secondary | ICD-10-CM | POA: Diagnosis not present

## 2014-06-19 DIAGNOSIS — I10 Essential (primary) hypertension: Secondary | ICD-10-CM | POA: Diagnosis not present

## 2014-06-19 DIAGNOSIS — N189 Chronic kidney disease, unspecified: Secondary | ICD-10-CM | POA: Diagnosis not present

## 2014-10-20 DIAGNOSIS — R7309 Other abnormal glucose: Secondary | ICD-10-CM | POA: Diagnosis not present

## 2014-10-20 DIAGNOSIS — I10 Essential (primary) hypertension: Secondary | ICD-10-CM | POA: Diagnosis not present

## 2014-10-20 DIAGNOSIS — N184 Chronic kidney disease, stage 4 (severe): Secondary | ICD-10-CM | POA: Diagnosis not present

## 2014-10-20 DIAGNOSIS — E780 Pure hypercholesterolemia: Secondary | ICD-10-CM | POA: Diagnosis not present

## 2014-10-20 DIAGNOSIS — F064 Anxiety disorder due to known physiological condition: Secondary | ICD-10-CM | POA: Diagnosis not present

## 2014-10-23 DIAGNOSIS — Z23 Encounter for immunization: Secondary | ICD-10-CM | POA: Diagnosis not present

## 2015-02-24 DIAGNOSIS — E785 Hyperlipidemia, unspecified: Secondary | ICD-10-CM | POA: Diagnosis not present

## 2015-02-24 DIAGNOSIS — R7309 Other abnormal glucose: Secondary | ICD-10-CM | POA: Diagnosis not present

## 2015-02-24 DIAGNOSIS — I10 Essential (primary) hypertension: Secondary | ICD-10-CM | POA: Diagnosis not present

## 2015-02-24 DIAGNOSIS — N184 Chronic kidney disease, stage 4 (severe): Secondary | ICD-10-CM | POA: Diagnosis not present

## 2015-02-24 DIAGNOSIS — F064 Anxiety disorder due to known physiological condition: Secondary | ICD-10-CM | POA: Diagnosis not present

## 2015-06-25 DIAGNOSIS — Z6824 Body mass index (BMI) 24.0-24.9, adult: Secondary | ICD-10-CM | POA: Diagnosis not present

## 2015-06-25 DIAGNOSIS — F064 Anxiety disorder due to known physiological condition: Secondary | ICD-10-CM | POA: Diagnosis not present

## 2015-06-25 DIAGNOSIS — I10 Essential (primary) hypertension: Secondary | ICD-10-CM | POA: Diagnosis not present

## 2015-06-25 DIAGNOSIS — R7309 Other abnormal glucose: Secondary | ICD-10-CM | POA: Diagnosis not present

## 2015-06-25 DIAGNOSIS — N184 Chronic kidney disease, stage 4 (severe): Secondary | ICD-10-CM | POA: Diagnosis not present

## 2015-06-25 DIAGNOSIS — F5101 Primary insomnia: Secondary | ICD-10-CM | POA: Diagnosis not present

## 2015-06-25 DIAGNOSIS — E785 Hyperlipidemia, unspecified: Secondary | ICD-10-CM | POA: Diagnosis not present

## 2015-07-27 DIAGNOSIS — I10 Essential (primary) hypertension: Secondary | ICD-10-CM | POA: Diagnosis not present

## 2015-07-27 DIAGNOSIS — Z6824 Body mass index (BMI) 24.0-24.9, adult: Secondary | ICD-10-CM | POA: Diagnosis not present

## 2015-07-27 DIAGNOSIS — N184 Chronic kidney disease, stage 4 (severe): Secondary | ICD-10-CM | POA: Diagnosis not present

## 2015-07-27 DIAGNOSIS — E785 Hyperlipidemia, unspecified: Secondary | ICD-10-CM | POA: Diagnosis not present

## 2015-08-27 DIAGNOSIS — E785 Hyperlipidemia, unspecified: Secondary | ICD-10-CM | POA: Diagnosis not present

## 2015-08-27 DIAGNOSIS — N184 Chronic kidney disease, stage 4 (severe): Secondary | ICD-10-CM | POA: Diagnosis not present

## 2015-08-27 DIAGNOSIS — I10 Essential (primary) hypertension: Secondary | ICD-10-CM | POA: Diagnosis not present

## 2015-10-19 ENCOUNTER — Other Ambulatory Visit: Payer: Self-pay

## 2015-11-30 DIAGNOSIS — Z23 Encounter for immunization: Secondary | ICD-10-CM | POA: Diagnosis not present

## 2015-12-29 DIAGNOSIS — I10 Essential (primary) hypertension: Secondary | ICD-10-CM | POA: Diagnosis not present

## 2015-12-29 DIAGNOSIS — R634 Abnormal weight loss: Secondary | ICD-10-CM | POA: Diagnosis not present

## 2015-12-29 DIAGNOSIS — Z79899 Other long term (current) drug therapy: Secondary | ICD-10-CM | POA: Diagnosis not present

## 2015-12-29 DIAGNOSIS — F064 Anxiety disorder due to known physiological condition: Secondary | ICD-10-CM | POA: Diagnosis not present

## 2015-12-29 DIAGNOSIS — N189 Chronic kidney disease, unspecified: Secondary | ICD-10-CM | POA: Diagnosis not present

## 2016-03-01 DIAGNOSIS — N189 Chronic kidney disease, unspecified: Secondary | ICD-10-CM | POA: Diagnosis not present

## 2016-03-01 DIAGNOSIS — I10 Essential (primary) hypertension: Secondary | ICD-10-CM | POA: Diagnosis not present

## 2016-03-01 DIAGNOSIS — Z6823 Body mass index (BMI) 23.0-23.9, adult: Secondary | ICD-10-CM | POA: Diagnosis not present

## 2016-03-13 DIAGNOSIS — Z1211 Encounter for screening for malignant neoplasm of colon: Secondary | ICD-10-CM | POA: Diagnosis not present

## 2016-03-13 DIAGNOSIS — Z1212 Encounter for screening for malignant neoplasm of rectum: Secondary | ICD-10-CM | POA: Diagnosis not present

## 2016-03-15 DIAGNOSIS — M8589 Other specified disorders of bone density and structure, multiple sites: Secondary | ICD-10-CM | POA: Diagnosis not present

## 2016-03-15 DIAGNOSIS — Z1231 Encounter for screening mammogram for malignant neoplasm of breast: Secondary | ICD-10-CM | POA: Diagnosis not present

## 2016-03-21 DIAGNOSIS — R921 Mammographic calcification found on diagnostic imaging of breast: Secondary | ICD-10-CM | POA: Diagnosis not present

## 2016-05-02 DIAGNOSIS — E785 Hyperlipidemia, unspecified: Secondary | ICD-10-CM | POA: Diagnosis not present

## 2016-05-02 DIAGNOSIS — I10 Essential (primary) hypertension: Secondary | ICD-10-CM | POA: Diagnosis not present

## 2016-05-02 DIAGNOSIS — F064 Anxiety disorder due to known physiological condition: Secondary | ICD-10-CM | POA: Diagnosis not present

## 2016-08-31 DIAGNOSIS — E7800 Pure hypercholesterolemia, unspecified: Secondary | ICD-10-CM | POA: Diagnosis not present

## 2016-08-31 DIAGNOSIS — I10 Essential (primary) hypertension: Secondary | ICD-10-CM | POA: Diagnosis not present

## 2016-08-31 DIAGNOSIS — N184 Chronic kidney disease, stage 4 (severe): Secondary | ICD-10-CM | POA: Diagnosis not present

## 2016-08-31 DIAGNOSIS — R634 Abnormal weight loss: Secondary | ICD-10-CM | POA: Diagnosis not present

## 2016-09-21 DIAGNOSIS — R921 Mammographic calcification found on diagnostic imaging of breast: Secondary | ICD-10-CM | POA: Diagnosis not present

## 2017-01-03 DIAGNOSIS — E785 Hyperlipidemia, unspecified: Secondary | ICD-10-CM | POA: Diagnosis not present

## 2017-01-03 DIAGNOSIS — N189 Chronic kidney disease, unspecified: Secondary | ICD-10-CM | POA: Diagnosis not present

## 2017-01-03 DIAGNOSIS — I10 Essential (primary) hypertension: Secondary | ICD-10-CM | POA: Diagnosis not present

## 2017-03-29 DIAGNOSIS — R921 Mammographic calcification found on diagnostic imaging of breast: Secondary | ICD-10-CM | POA: Diagnosis not present

## 2017-03-29 DIAGNOSIS — R922 Inconclusive mammogram: Secondary | ICD-10-CM | POA: Diagnosis not present

## 2017-05-10 DIAGNOSIS — F064 Anxiety disorder due to known physiological condition: Secondary | ICD-10-CM | POA: Diagnosis not present

## 2017-05-10 DIAGNOSIS — I129 Hypertensive chronic kidney disease with stage 1 through stage 4 chronic kidney disease, or unspecified chronic kidney disease: Secondary | ICD-10-CM | POA: Diagnosis not present

## 2017-05-10 DIAGNOSIS — E785 Hyperlipidemia, unspecified: Secondary | ICD-10-CM | POA: Diagnosis not present

## 2017-05-10 DIAGNOSIS — Z0000 Encounter for general adult medical examination without abnormal findings: Secondary | ICD-10-CM | POA: Diagnosis not present

## 2017-05-10 DIAGNOSIS — N183 Chronic kidney disease, stage 3 (moderate): Secondary | ICD-10-CM | POA: Diagnosis not present

## 2017-09-07 DIAGNOSIS — R634 Abnormal weight loss: Secondary | ICD-10-CM | POA: Diagnosis not present

## 2017-09-07 DIAGNOSIS — I129 Hypertensive chronic kidney disease with stage 1 through stage 4 chronic kidney disease, or unspecified chronic kidney disease: Secondary | ICD-10-CM | POA: Diagnosis not present

## 2017-09-07 DIAGNOSIS — F064 Anxiety disorder due to known physiological condition: Secondary | ICD-10-CM | POA: Diagnosis not present

## 2017-09-07 DIAGNOSIS — Z6822 Body mass index (BMI) 22.0-22.9, adult: Secondary | ICD-10-CM | POA: Diagnosis not present

## 2017-09-07 DIAGNOSIS — E785 Hyperlipidemia, unspecified: Secondary | ICD-10-CM | POA: Diagnosis not present

## 2017-09-07 DIAGNOSIS — F0634 Mood disorder due to known physiological condition with mixed features: Secondary | ICD-10-CM | POA: Diagnosis not present

## 2017-09-07 DIAGNOSIS — E7800 Pure hypercholesterolemia, unspecified: Secondary | ICD-10-CM | POA: Diagnosis not present

## 2018-01-08 DIAGNOSIS — Z6822 Body mass index (BMI) 22.0-22.9, adult: Secondary | ICD-10-CM | POA: Diagnosis not present

## 2018-01-08 DIAGNOSIS — I10 Essential (primary) hypertension: Secondary | ICD-10-CM | POA: Diagnosis not present

## 2018-04-02 DIAGNOSIS — Z1231 Encounter for screening mammogram for malignant neoplasm of breast: Secondary | ICD-10-CM | POA: Diagnosis not present

## 2018-04-02 DIAGNOSIS — Z85118 Personal history of other malignant neoplasm of bronchus and lung: Secondary | ICD-10-CM | POA: Diagnosis not present

## 2018-04-02 DIAGNOSIS — Z90710 Acquired absence of both cervix and uterus: Secondary | ICD-10-CM | POA: Diagnosis not present

## 2018-04-02 DIAGNOSIS — M8589 Other specified disorders of bone density and structure, multiple sites: Secondary | ICD-10-CM | POA: Diagnosis not present

## 2018-05-29 DIAGNOSIS — I129 Hypertensive chronic kidney disease with stage 1 through stage 4 chronic kidney disease, or unspecified chronic kidney disease: Secondary | ICD-10-CM | POA: Diagnosis not present

## 2018-08-28 DIAGNOSIS — I129 Hypertensive chronic kidney disease with stage 1 through stage 4 chronic kidney disease, or unspecified chronic kidney disease: Secondary | ICD-10-CM | POA: Diagnosis not present

## 2018-08-28 DIAGNOSIS — F064 Anxiety disorder due to known physiological condition: Secondary | ICD-10-CM | POA: Diagnosis not present

## 2018-08-28 DIAGNOSIS — Z0000 Encounter for general adult medical examination without abnormal findings: Secondary | ICD-10-CM | POA: Diagnosis not present

## 2018-08-28 DIAGNOSIS — F5101 Primary insomnia: Secondary | ICD-10-CM | POA: Diagnosis not present

## 2018-08-28 DIAGNOSIS — E785 Hyperlipidemia, unspecified: Secondary | ICD-10-CM | POA: Diagnosis not present

## 2018-10-31 DIAGNOSIS — I10 Essential (primary) hypertension: Secondary | ICD-10-CM | POA: Diagnosis not present

## 2018-10-31 DIAGNOSIS — R251 Tremor, unspecified: Secondary | ICD-10-CM | POA: Diagnosis not present

## 2018-12-31 DIAGNOSIS — R251 Tremor, unspecified: Secondary | ICD-10-CM | POA: Diagnosis not present

## 2018-12-31 DIAGNOSIS — I129 Hypertensive chronic kidney disease with stage 1 through stage 4 chronic kidney disease, or unspecified chronic kidney disease: Secondary | ICD-10-CM | POA: Diagnosis not present

## 2018-12-31 DIAGNOSIS — E785 Hyperlipidemia, unspecified: Secondary | ICD-10-CM | POA: Diagnosis not present

## 2019-04-04 DIAGNOSIS — K122 Cellulitis and abscess of mouth: Secondary | ICD-10-CM | POA: Diagnosis not present

## 2019-04-04 DIAGNOSIS — E785 Hyperlipidemia, unspecified: Secondary | ICD-10-CM | POA: Diagnosis not present

## 2019-04-04 DIAGNOSIS — F0634 Mood disorder due to known physiological condition with mixed features: Secondary | ICD-10-CM | POA: Diagnosis not present

## 2019-04-04 DIAGNOSIS — I129 Hypertensive chronic kidney disease with stage 1 through stage 4 chronic kidney disease, or unspecified chronic kidney disease: Secondary | ICD-10-CM | POA: Diagnosis not present

## 2019-04-08 DIAGNOSIS — Z1231 Encounter for screening mammogram for malignant neoplasm of breast: Secondary | ICD-10-CM | POA: Diagnosis not present

## 2019-04-11 DIAGNOSIS — C030 Malignant neoplasm of upper gum: Secondary | ICD-10-CM | POA: Diagnosis not present

## 2019-04-24 DIAGNOSIS — R52 Pain, unspecified: Secondary | ICD-10-CM | POA: Diagnosis not present

## 2019-04-25 DIAGNOSIS — C030 Malignant neoplasm of upper gum: Secondary | ICD-10-CM | POA: Diagnosis not present

## 2019-05-07 ENCOUNTER — Ambulatory Visit (INDEPENDENT_AMBULATORY_CARE_PROVIDER_SITE_OTHER)
Admission: EM | Admit: 2019-05-07 | Discharge: 2019-05-07 | Disposition: A | Discharge status: Home / Self Care | Payer: Medicare HMO | Source: Home / Self Care

## 2019-05-07 ENCOUNTER — Emergency Department (HOSPITAL_COMMUNITY)
Admission: EM | Admit: 2019-05-07 | Discharge: 2019-05-08 | Disposition: A | Discharge status: Home / Self Care | Payer: Medicare HMO | Attending: Emergency Medicine | Admitting: Emergency Medicine

## 2019-05-07 ENCOUNTER — Other Ambulatory Visit: Payer: Self-pay

## 2019-05-07 ENCOUNTER — Encounter (HOSPITAL_COMMUNITY): Payer: Self-pay

## 2019-05-07 DIAGNOSIS — I10 Essential (primary) hypertension: Secondary | ICD-10-CM | POA: Diagnosis not present

## 2019-05-07 DIAGNOSIS — R630 Anorexia: Secondary | ICD-10-CM

## 2019-05-07 DIAGNOSIS — K1379 Other lesions of oral mucosa: Secondary | ICD-10-CM

## 2019-05-07 DIAGNOSIS — K122 Cellulitis and abscess of mouth: Secondary | ICD-10-CM

## 2019-05-07 DIAGNOSIS — Z87891 Personal history of nicotine dependence: Secondary | ICD-10-CM | POA: Insufficient documentation

## 2019-05-07 DIAGNOSIS — K0889 Other specified disorders of teeth and supporting structures: Secondary | ICD-10-CM | POA: Diagnosis present

## 2019-05-07 DIAGNOSIS — R000 Tachycardia, unspecified: Secondary | ICD-10-CM

## 2019-05-07 DIAGNOSIS — Z79899 Other long term (current) drug therapy: Secondary | ICD-10-CM | POA: Diagnosis not present

## 2019-05-07 MED ORDER — FENTANYL CITRATE (PF) 100 MCG/2ML IJ SOLN
50.0000 ug | Freq: Once | INTRAMUSCULAR | Status: AC
  Administered 2019-05-07: 50 ug via INTRAVENOUS
  Filled 2019-05-07: qty 2

## 2019-05-07 MED ORDER — HYDROCODONE-ACETAMINOPHEN 5-325 MG PO TABS
ORAL_TABLET | ORAL | Status: AC
  Filled 2019-05-07: qty 1

## 2019-05-07 MED ORDER — HYDROCODONE-ACETAMINOPHEN 5-325 MG PO TABS
1.0000 | ORAL_TABLET | Freq: Once | ORAL | Status: AC
  Administered 2019-05-07: 1 via ORAL

## 2019-05-07 NOTE — ED Triage Notes (Signed)
Pt c/o upper gum pain that started 2 mos ago. Pt states she went to oral surgeon and they did biopsy of her upper gums and was told it's cancerous. Pt states they did not tell her anything about a f/u. Pt states she's unable to eat due to the pain.

## 2019-05-07 NOTE — ED Triage Notes (Signed)
Pt reports that she has been having upper tooth pain for the past two, went to UC and told to come here for possible abscess and CT scan

## 2019-05-07 NOTE — ED Provider Notes (Signed)
[place]   MRN: [id number] DOB: 1945/12/29  Subjective:   Amanda Rivers is a 74 y.o. female presenting for 77-month history of persistent and worsening right upper sided mouth pain.  Patient states that she has seen a couple of dental surgeons 1 of which did a biopsy and ended up determining that her biopsy was cancerous.  She has not been back to them.  Has had significant difficulty with swallowing and eating, worsening pain and swelling.  Patient has used pain medication provided to her but has not been able to sleep this past week due to her pain.  Her granddaughter wanted to take her to the emergency room but patient did not want a wait there.  No current facility-administered medications for this encounter.  Current Outpatient Medications:  .  amLODipine-olmesartan (AZOR) 10-40 MG per tablet, Take 1 tablet by mouth daily.  , Disp: , Rfl:  .  dexlansoprazole (DEXILANT) 60 MG capsule, Take 60 mg by mouth daily.  , Disp: , Rfl:  .  nebivolol (BYSTOLIC) 10 MG tablet, Take 10 mg by mouth daily.  , Disp: , Rfl:  .  spironolactone (ALDACTONE) 25 MG tablet, Take 25 mg by mouth 2 (two) times daily.  , Disp: , Rfl:    No Known Allergies  Past Medical History:  Diagnosis Date  . Hypertension   . Malignant neoplasm of bronchus and lung, unspecified site   . Reflux      Past Surgical History:  Procedure Laterality Date  . R Vats R upper lobectomy with node dissection  06/21/06    History reviewed. No pertinent family history.  Social History   Tobacco Use  . Smoking status: Former Smoker    Quit date: 09/25/2006    Years since quitting: 12.6  . Smokeless tobacco: Never Used  Substance Use Topics  . Alcohol use: Yes    Comment: occasional beer  . Drug use: Never    ROS   Objective:   Vitals: BP 132/85   Pulse (!) 126   Temp 99.6 F (37.6 C) (Oral)   Resp 20   Ht 5\' 11"  (1.803 m)   Wt 160 lb (72.6 kg)   SpO2 97%   BMI 22.32 kg/m   Physical  Exam Constitutional:      General: She is not in acute distress.    Appearance: Normal appearance. She is well-developed. She is not ill-appearing, toxic-appearing or diaphoretic.  HENT:     Head: Normocephalic and atraumatic.     Nose: Nose normal.     Mouth/Throat:     Mouth: Mucous membranes are moist.   Eyes:     General: No scleral icterus.    Extraocular Movements: Extraocular movements intact.     Pupils: Pupils are equal, round, and reactive to light.  Cardiovascular:     Rate and Rhythm: Normal rate.  Pulmonary:     Effort: Pulmonary effort is normal.  Skin:    General: Skin is warm and dry.  Neurological:     General: No focal deficit present.     Mental Status: She is alert and oriented to person, place, and time.  Psychiatric:        Mood and Affect: Mood normal.        Behavior: Behavior normal.     Assessment and Plan :   1. Abscess or cellulitis, oral soft tissue   2. Oral pain   3. Decreased appetite   4. Tachycardia     Patient  given hydrocodone in clinic for pain control.  I examined patient with Dr. Mannie Stabile and we will redirect patient to the emergency room for imaging, more involved treatment including consideration for admission, IV antibiotics.  Spoke with patient's granddaughter, she is agreeable to taking her grandmother (the patient) to the ER now.   Jaynee Eagles, PA-C 05/07/19 1910

## 2019-05-07 NOTE — Discharge Instructions (Addendum)
Please report to the emergency room now as I am concerned you have an oral abscess of the upper part of your mouth. You will likely need imaging like a CT scan and more in depth treatment than we can provide in the emergency room.

## 2019-05-08 ENCOUNTER — Telehealth: Payer: Self-pay | Admitting: Surgery

## 2019-05-08 ENCOUNTER — Telehealth: Payer: Self-pay | Admitting: *Deleted

## 2019-05-08 ENCOUNTER — Emergency Department (HOSPITAL_COMMUNITY): Payer: Medicare HMO

## 2019-05-08 ENCOUNTER — Encounter (HOSPITAL_COMMUNITY): Payer: Self-pay | Admitting: Radiology

## 2019-05-08 DIAGNOSIS — K0889 Other specified disorders of teeth and supporting structures: Secondary | ICD-10-CM | POA: Diagnosis not present

## 2019-05-08 LAB — CBC WITH DIFFERENTIAL/PLATELET
Abs Immature Granulocytes: 0.03 10*3/uL (ref 0.00–0.07)
Basophils Absolute: 0 10*3/uL (ref 0.0–0.1)
Basophils Relative: 1 %
Eosinophils Absolute: 0.2 10*3/uL (ref 0.0–0.5)
Eosinophils Relative: 3 %
HCT: 39.7 % (ref 36.0–46.0)
Hemoglobin: 12.5 g/dL (ref 12.0–15.0)
Immature Granulocytes: 1 %
Lymphocytes Relative: 27 %
Lymphs Abs: 1.4 10*3/uL (ref 0.7–4.0)
MCH: 24.8 pg — ABNORMAL LOW (ref 26.0–34.0)
MCHC: 31.5 g/dL (ref 30.0–36.0)
MCV: 78.6 fL — ABNORMAL LOW (ref 80.0–100.0)
Monocytes Absolute: 0.4 10*3/uL (ref 0.1–1.0)
Monocytes Relative: 8 %
Neutro Abs: 3.1 10*3/uL (ref 1.7–7.7)
Neutrophils Relative %: 60 %
Platelets: 336 10*3/uL (ref 150–400)
RBC: 5.05 MIL/uL (ref 3.87–5.11)
RDW: 17.9 % — ABNORMAL HIGH (ref 11.5–15.5)
WBC: 5.1 10*3/uL (ref 4.0–10.5)
nRBC: 0 % (ref 0.0–0.2)

## 2019-05-08 LAB — BASIC METABOLIC PANEL
Anion gap: 13 (ref 5–15)
BUN: 16 mg/dL (ref 8–23)
CO2: 21 mmol/L — ABNORMAL LOW (ref 22–32)
Calcium: 9.6 mg/dL (ref 8.9–10.3)
Chloride: 99 mmol/L (ref 98–111)
Creatinine, Ser: 1.21 mg/dL — ABNORMAL HIGH (ref 0.44–1.00)
GFR calc Af Amer: 51 mL/min — ABNORMAL LOW (ref >60)
GFR calc non Af Amer: 44 mL/min — ABNORMAL LOW (ref >60)
Glucose, Bld: 108 mg/dL — ABNORMAL HIGH (ref 70–99)
Potassium: 4.5 mmol/L (ref 3.5–5.1)
Sodium: 133 mmol/L — ABNORMAL LOW (ref 135–145)

## 2019-05-08 LAB — I-STAT CHEM 8, ED
BUN: 18 mg/dL (ref 8–23)
Calcium, Ion: 1.14 mmol/L — ABNORMAL LOW (ref 1.15–1.40)
Chloride: 100 mmol/L (ref 98–111)
Creatinine, Ser: 1 mg/dL (ref 0.44–1.00)
Glucose, Bld: 102 mg/dL — ABNORMAL HIGH (ref 70–99)
HCT: 42 % (ref 36.0–46.0)
Hemoglobin: 14.3 g/dL (ref 12.0–15.0)
Potassium: 4.4 mmol/L (ref 3.5–5.1)
Sodium: 132 mmol/L — ABNORMAL LOW (ref 135–145)
TCO2: 25 mmol/L (ref 22–32)

## 2019-05-08 MED ORDER — ACETAMINOPHEN 500 MG PO TABS: 1000.0000 mg | ORAL_TABLET | Freq: Three times a day (TID) | ORAL | 0 refills | Status: AC

## 2019-05-08 MED ORDER — HYDROCODONE-ACETAMINOPHEN 5-325 MG PO TABS
1.0000 | ORAL_TABLET | Freq: Once | ORAL | Status: AC
  Administered 2019-05-08: 1 via ORAL
  Filled 2019-05-08: qty 1

## 2019-05-08 MED ORDER — IOHEXOL 300 MG/ML  SOLN
75.0000 mL | Freq: Once | INTRAMUSCULAR | Status: AC | PRN
  Administered 2019-05-08: 75 mL via INTRAVENOUS

## 2019-05-08 MED ORDER — KETOROLAC TROMETHAMINE 15 MG/ML IJ SOLN
15.0000 mg | Freq: Once | INTRAMUSCULAR | Status: AC
  Administered 2019-05-08: 03:00:00 15 mg via INTRAVENOUS
  Filled 2019-05-08: qty 1

## 2019-05-08 MED ORDER — OXYCODONE HCL 5 MG PO TABS: 2.5000 mg | ORAL_TABLET | Freq: Four times a day (QID) | ORAL | 0 refills | Status: AC | PRN

## 2019-05-08 MED ORDER — SODIUM CHLORIDE 0.9 % IV BOLUS
1000.0000 mL | Freq: Once | INTRAVENOUS | Status: AC
Start: 2019-05-08 — End: 2019-05-08
  Administered 2019-05-08: 1000 mL via INTRAVENOUS

## 2019-05-08 NOTE — Telephone Encounter (Addendum)
ED CM received a CM consult via CM Pool concerning patient needing oncology follow up for oral lesion.  CM reviewed patient record and reached out to patient identified by 2 patient identifier who verified  Amanda Rivers PCP and last visit was 2/21. Discussed follow up, for ED evaluation finding, patient tells me that she has an appointment with Dr. Criss Rosales in the morning, and has an appointment with ENT on Monday, Referral also place with Oncology Navigator.  No further ED TOC needs identified.

## 2019-05-08 NOTE — ED Provider Notes (Signed)
[facility] EMERGENCY DEPARTMENT Provider Note  CSN: [id number] Arrival date & time: 05/07/19 1913  Chief Complaint(s) Dental Pain  HPI Amanda Rivers is a 74 y.o. female sent here from urgent care to evaluate palatal mass.  Patient's noted the lesion 2 months ago.  She is currently being worked up by [employer] who performed a biopsy and determined that the lesion is cancerous.  Patient presented for persistent pain.  She has run out of her pain medicine.  Pain is severe and worse with palpation.  She has had decreased oral intake due to the pain.  She denies any fevers or chills.  No headache.  No nausea vomiting.  No chest pain or shortness of breath.  No other physical complaints.  Patient is looking to change care from this oral surgeon.   HPI  Past Medical History Past Medical History:  Diagnosis Date  . Hypertension   . Malignant neoplasm of bronchus and lung, unspecified site   . Reflux    There are no problems to display for this patient.  Home Medication(s) Prior to Admission medications   Medication Sig Start Date End Date Taking? Authorizing Provider  amLODipine-olmesartan (AZOR) 10-40 MG per tablet Take 1 tablet by mouth daily.     Yes [provider]  dexlansoprazole (DEXILANT) 60 MG capsule Take 60 mg by mouth daily.     Yes [provider]  nebivolol (BYSTOLIC) 10 MG tablet Take 10 mg by mouth daily.     Yes [provider]  spironolactone (ALDACTONE) 25 MG tablet Take 25 mg by mouth 2 (two) times daily.     Yes [provider]  acetaminophen (TYLENOL) 500 MG tablet Take 2 tablets (1,000 mg total) by mouth every 8 (eight) hours for 5 days. Do not take more than 4000 mg of acetaminophen (Tylenol) in a 24-hour period. Please note that other medicines that you may be prescribed may have Tylenol as well. 05/08/19 05/13/19  Fatima Blank, MD  oxyCODONE (ROXICODONE) 5 MG immediate release tablet Take 0.5-1  tablets (2.5-5 mg total) by mouth every 6 (six) hours as needed for up to 5 days for severe pain or breakthrough pain (That is not controlled with scheduled Tylenol). 05/08/19 05/13/19  Fatima Blank, MD                                                                                                                                    Past Surgical History Past Surgical History:  Procedure Laterality Date  . R Vats R upper lobectomy with node dissection  06/21/06   Family History No family history on file.  Social History Social History   Tobacco Use  . Smoking status: Former Smoker    Quit date: 09/25/2006    Years since quitting: 12.6  . Smokeless tobacco: Never Used  Substance Use Topics  . Alcohol use: Yes    Comment:  occasional beer  . Drug use: Never   Allergies Patient has no known allergies.  Review of Systems Review of Systems All other systems are reviewed and are negative for acute change except as noted in the HPI  Physical Exam Vital Signs  I have reviewed the triage vital signs BP 134/69 (BP Location: Right Arm)   Pulse 97   Temp 99.1 F (37.3 C) (Oral)   Resp 20   Ht 5' 11.25" (1.81 m)   Wt 72.6 kg   SpO2 97%   BMI 22.16 kg/m   Physical Exam Vitals reviewed.  Constitutional:      General: She is not in acute distress.    Appearance: She is well-developed. She is not diaphoretic.  HENT:     Head: Normocephalic and atraumatic.     Nose: Nose normal.     Mouth/Throat:     Comments: Edentulous, upper. Left alveolar/palatal lesion (see image) Eyes:     General: No scleral icterus.       Right eye: No discharge.        Left eye: No discharge.     Conjunctiva/sclera: Conjunctivae normal.     Pupils: Pupils are equal, round, and reactive to light.  Cardiovascular:     Rate and Rhythm: Normal rate and regular rhythm.     Heart sounds: No murmur. No friction rub. No gallop.   Pulmonary:     Effort: Pulmonary effort is normal. No respiratory  distress.     Breath sounds: Normal breath sounds. No stridor. No rales.  Abdominal:     General: There is no distension.     Palpations: Abdomen is soft.     Tenderness: There is no abdominal tenderness.  Musculoskeletal:        General: No tenderness.     Cervical back: Normal range of motion and neck supple.  Skin:    General: Skin is warm and dry.     Findings: No erythema or rash.  Neurological:     Mental Status: She is alert and oriented to person, place, and time.       ED Results and Treatments Labs (all labs ordered are listed, but only abnormal results are displayed) Labs Reviewed  CBC WITH DIFFERENTIAL/PLATELET - Abnormal; Notable for the following components:      Result Value   MCV 78.6 (*)    MCH 24.8 (*)    RDW 17.9 (*)    All other components within normal limits  BASIC METABOLIC PANEL - Abnormal; Notable for the following components:   Sodium 133 (*)    CO2 21 (*)    Glucose, Bld 108 (*)    Creatinine, Ser 1.21 (*)    GFR calc non Af Amer 44 (*)    GFR calc Af Amer 51 (*)    All other components within normal limits  I-STAT CHEM 8, ED - Abnormal; Notable for the following components:   Sodium 132 (*)    Glucose, Bld 102 (*)    Calcium, Ion 1.14 (*)    All other components within normal limits  EKG  EKG Interpretation  Date/Time:    Ventricular Rate:    PR Interval:    QRS Duration:   QT Interval:    QTC Calculation:   R Axis:     Text Interpretation:        Radiology CT Maxillofacial W Contrast  Addendum Date: 05/08/2019   ADDENDUM REPORT: 05/08/2019 01:41 ADDENDUM: Additional clinical history was provided. The patient has had a potential malignancy identified on prior oral cavity examination. These imaging findings are also consistent with a neoplastic process, which is more likely given this additional information.  Electronically Signed   By: Ulyses Jarred M.D.   On: 05/08/2019 01:41   Result Date: 05/08/2019 CLINICAL DATA:  Upper tooth pain. Possible oral abscess. EXAM: CT MAXILLOFACIAL WITH CONTRAST TECHNIQUE: Multidetector CT imaging of the maxillofacial structures was performed with intravenous contrast. Multiplanar CT image reconstructions were also generated. CONTRAST:  95mL OMNIPAQUE IOHEXOL 300 MG/ML  SOLN COMPARISON:  06/10/2008 FINDINGS: Osseous: No facial fracture. Dental: There are no remaining maxillary teeth. There is dehiscence of the posterior floor of the left maxillary sinus in the location where the molar roots would normally be. There is heterogeneous soft tissue extending through the defect. There is also overlying mucosal thickening of the inferior left maxillary sinus. Orbits: The globes are intact. Normal appearance of the intra- and extraconal fat. Symmetric extraocular muscles. Sinuses: Partial opacification of the left maxillary sinus. Soft tissues: Normal visualized extracranial soft tissues. Limited intracranial: Normal. IMPRESSION: Dehiscence of the posterior floor of the left maxillary sinus with heterogeneous soft tissue, likely phlegmon, extending through the defect. Overlying maxillary sinus mucosal thickening. Electronically Signed: By: Ulyses Jarred M.D. On: 05/08/2019 01:14    Pertinent labs & imaging results that were available during my care of the patient were reviewed by me and considered in my medical decision making (see chart for details).  Medications Ordered in ED Medications  fentaNYL (SUBLIMAZE) injection 50 mcg (50 mcg Intravenous Given 05/07/19 2355)  sodium chloride 0.9 % bolus 1,000 mL (1,000 mLs Intravenous New Bag/Given 05/08/19 0120)  iohexol (OMNIPAQUE) 300 MG/ML solution 75 mL (75 mLs Intravenous Contrast Given 05/08/19 0057)  HYDROcodone-acetaminophen (NORCO/VICODIN) 5-325 MG per tablet 1 tablet (1 tablet Oral Given 05/08/19 0319)  ketorolac (TORADOL) 15 MG/ML  injection 15 mg (15 mg Intravenous Given 05/08/19 0320)                                                                                                                                    Procedures Procedures  (including critical care time)  Medical Decision Making / ED Course I have reviewed the nursing notes for this encounter and the patient's prior records (if available in EHR or on provided paperwork).   Amanda Rivers was evaluated in Emergency Department on 05/08/2019 for the symptoms described in the history of present illness. She was evaluated in the context of the global COVID-19 pandemic, which  necessitated consideration that the patient might be at risk for infection with the SARS-CoV-2 virus that causes COVID-19. Institutional protocols and algorithms that pertain to the evaluation of patients at risk for COVID-19 are in a state of rapid change based on information released by regulatory bodies including the CDC and federal and state organizations. These policies and algorithms were followed during the patient's care in the ED.  Cancerous lesion to left upper palate. Decreased oral intake due to pain. Will get labs and imaging to better characterize the mass.  IV pain meds given.  Labs without leukocytosis or significant anemia.  CT confirmed likely neoplastic process.  Less likely infectious.  Pain control with medications.  Patient provided with contact information for oral surgery as well as ENT.  Transitional care consults was placed to assist patient with obtaining follow-up.      Final Clinical Impression(s) / ED Diagnoses Final diagnoses:  Mass of oral cavity    The patient appears reasonably screened and/or stabilized for discharge and I doubt any other medical condition or other [facility] requiring further screening, evaluation, or treatment in the ED at this time prior to discharge. Safe for discharge with strict return precautions.  Disposition:  Discharge  Condition: Good  I have discussed the results, Dx and Tx plan with the patient/family who expressed understanding and agree(s) with the plan. Discharge instructions discussed at length. The patient/family was given strict return precautions who verbalized understanding of the instructions. No further questions at time of discharge.    ED Discharge Orders         Ordered    Ambulatory referral to Oncology     05/08/19 0430    acetaminophen (TYLENOL) 500 MG tablet  Every 8 hours     05/08/19 0430    oxyCODONE (ROXICODONE) 5 MG immediate release tablet  Every 6 hours PRN     05/08/19 [place] narcotic database reviewed and no active prescriptions noted.   Follow Up: Meade Maw, DDS [address] [place] [address] [phone]  Call    Jerrell Belfast, [place] [address] [place] [address] [phone]  Call    Primary care provider  Schedule an appointment as soon as possible for a visit  For close follow up to assess for continued pain management     This chart was dictated using voice recognition software.  Despite best efforts to proofread,  errors can occur which can change the documentation meaning.   Fatima Blank, MD 05/08/19 [phone]

## 2019-05-08 NOTE — Telephone Encounter (Signed)
Oncology Nurse Navigator Documentation  In follow-up to referral received today, place introductory call to Ms. Amanda Rivers, LVMM asking for return call.   Spoke with granddaughter Amanda Rivers per my conversation with Amanda Rivers HIM.  She indicated she is contact for scheduling of appts.  Introduced myself, provided contact information, explained I will be coordinating [place] appointments s/p appointment with ENT.  She confirmed Amanda Rivers has an appointment at [facility] [place] on Monday.  She provided name of oral surgeon who conducted 04/22/2019 biopsy and from whom I later received an e-mailed copy.  I forwarded copy to GENT so available on Monday. I encouraged Amanda Rivers to call me with questions/concerns moving forward.  Amanda Orem, RN, BSN Head & Neck Oncology Nurse Amanda Rivers at [place] [phone]

## 2019-05-12 ENCOUNTER — Telehealth: Payer: Self-pay | Admitting: *Deleted

## 2019-05-12 DIAGNOSIS — C069 Malignant neoplasm of mouth, unspecified: Secondary | ICD-10-CM | POA: Diagnosis not present

## 2019-05-12 NOTE — Telephone Encounter (Signed)
A user error has taken place: encounter opened in error, closed for administrative reasons.

## 2019-05-26 DIAGNOSIS — C069 Malignant neoplasm of mouth, unspecified: Secondary | ICD-10-CM | POA: Diagnosis not present

## 2019-05-26 DIAGNOSIS — C0689 Malignant neoplasm of overlapping sites of other parts of mouth: Secondary | ICD-10-CM | POA: Diagnosis not present

## 2019-05-26 DIAGNOSIS — C410 Malignant neoplasm of bones of skull and face: Secondary | ICD-10-CM | POA: Diagnosis not present

## 2019-05-26 DIAGNOSIS — Z79891 Long term (current) use of opiate analgesic: Secondary | ICD-10-CM | POA: Diagnosis not present

## 2019-05-29 DIAGNOSIS — Z20828 Contact with and (suspected) exposure to other viral communicable diseases: Secondary | ICD-10-CM | POA: Diagnosis not present

## 2019-06-02 DIAGNOSIS — R0902 Hypoxemia: Secondary | ICD-10-CM | POA: Diagnosis not present

## 2019-06-02 DIAGNOSIS — R0689 Other abnormalities of breathing: Secondary | ICD-10-CM | POA: Diagnosis not present

## 2019-06-02 DIAGNOSIS — E871 Hypo-osmolality and hyponatremia: Secondary | ICD-10-CM | POA: Diagnosis not present

## 2019-06-02 DIAGNOSIS — R0602 Shortness of breath: Secondary | ICD-10-CM | POA: Diagnosis not present

## 2019-06-02 DIAGNOSIS — I2699 Other pulmonary embolism without acute cor pulmonale: Secondary | ICD-10-CM | POA: Diagnosis not present

## 2019-06-02 DIAGNOSIS — I5023 Acute on chronic systolic (congestive) heart failure: Secondary | ICD-10-CM | POA: Diagnosis not present

## 2019-06-02 DIAGNOSIS — E878 Other disorders of electrolyte and fluid balance, not elsewhere classified: Secondary | ICD-10-CM | POA: Diagnosis not present

## 2019-06-02 DIAGNOSIS — I517 Cardiomegaly: Secondary | ICD-10-CM | POA: Diagnosis not present

## 2019-06-02 DIAGNOSIS — Z20822 Contact with and (suspected) exposure to covid-19: Secondary | ICD-10-CM | POA: Diagnosis not present

## 2019-06-02 DIAGNOSIS — I10 Essential (primary) hypertension: Secondary | ICD-10-CM | POA: Diagnosis not present

## 2019-06-02 DIAGNOSIS — I952 Hypotension due to drugs: Secondary | ICD-10-CM | POA: Diagnosis not present

## 2019-06-02 DIAGNOSIS — J984 Other disorders of lung: Secondary | ICD-10-CM | POA: Diagnosis not present

## 2019-06-02 DIAGNOSIS — J690 Pneumonitis due to inhalation of food and vomit: Secondary | ICD-10-CM | POA: Diagnosis not present

## 2019-06-02 DIAGNOSIS — K219 Gastro-esophageal reflux disease without esophagitis: Secondary | ICD-10-CM | POA: Insufficient documentation

## 2019-06-02 DIAGNOSIS — C7951 Secondary malignant neoplasm of bone: Secondary | ICD-10-CM | POA: Diagnosis not present

## 2019-06-02 DIAGNOSIS — R5383 Other fatigue: Secondary | ICD-10-CM | POA: Diagnosis not present

## 2019-06-02 DIAGNOSIS — C050 Malignant neoplasm of hard palate: Secondary | ICD-10-CM | POA: Diagnosis not present

## 2019-06-02 DIAGNOSIS — E222 Syndrome of inappropriate secretion of antidiuretic hormone: Secondary | ICD-10-CM | POA: Diagnosis not present

## 2019-06-02 DIAGNOSIS — Z7409 Other reduced mobility: Secondary | ICD-10-CM | POA: Diagnosis not present

## 2019-06-02 DIAGNOSIS — R000 Tachycardia, unspecified: Secondary | ICD-10-CM | POA: Diagnosis not present

## 2019-06-02 DIAGNOSIS — R4189 Other symptoms and signs involving cognitive functions and awareness: Secondary | ICD-10-CM | POA: Diagnosis not present

## 2019-06-02 DIAGNOSIS — I248 Other forms of acute ischemic heart disease: Secondary | ICD-10-CM | POA: Diagnosis not present

## 2019-06-02 DIAGNOSIS — D62 Acute posthemorrhagic anemia: Secondary | ICD-10-CM | POA: Diagnosis not present

## 2019-06-02 DIAGNOSIS — Z4659 Encounter for fitting and adjustment of other gastrointestinal appliance and device: Secondary | ICD-10-CM | POA: Diagnosis not present

## 2019-06-02 DIAGNOSIS — J90 Pleural effusion, not elsewhere classified: Secondary | ICD-10-CM | POA: Diagnosis not present

## 2019-06-02 DIAGNOSIS — I5020 Unspecified systolic (congestive) heart failure: Secondary | ICD-10-CM | POA: Diagnosis not present

## 2019-06-02 DIAGNOSIS — E785 Hyperlipidemia, unspecified: Secondary | ICD-10-CM | POA: Insufficient documentation

## 2019-06-02 DIAGNOSIS — Z87891 Personal history of nicotine dependence: Secondary | ICD-10-CM | POA: Diagnosis not present

## 2019-06-02 DIAGNOSIS — R0609 Other forms of dyspnea: Secondary | ICD-10-CM | POA: Diagnosis not present

## 2019-06-02 DIAGNOSIS — Z85118 Personal history of other malignant neoplasm of bronchus and lung: Secondary | ICD-10-CM | POA: Insufficient documentation

## 2019-06-02 DIAGNOSIS — R778 Other specified abnormalities of plasma proteins: Secondary | ICD-10-CM | POA: Diagnosis not present

## 2019-06-02 DIAGNOSIS — R251 Tremor, unspecified: Secondary | ICD-10-CM | POA: Insufficient documentation

## 2019-06-02 DIAGNOSIS — C069 Malignant neoplasm of mouth, unspecified: Secondary | ICD-10-CM | POA: Diagnosis not present

## 2019-06-02 DIAGNOSIS — R918 Other nonspecific abnormal finding of lung field: Secondary | ICD-10-CM | POA: Diagnosis not present

## 2019-06-02 DIAGNOSIS — I110 Hypertensive heart disease with heart failure: Secondary | ICD-10-CM | POA: Diagnosis not present

## 2019-06-02 DIAGNOSIS — R739 Hyperglycemia, unspecified: Secondary | ICD-10-CM | POA: Diagnosis not present

## 2019-06-02 DIAGNOSIS — J449 Chronic obstructive pulmonary disease, unspecified: Secondary | ICD-10-CM | POA: Diagnosis not present

## 2019-06-02 DIAGNOSIS — Z86711 Personal history of pulmonary embolism: Secondary | ICD-10-CM | POA: Diagnosis not present

## 2019-06-02 DIAGNOSIS — J309 Allergic rhinitis, unspecified: Secondary | ICD-10-CM | POA: Insufficient documentation

## 2019-06-04 DIAGNOSIS — C069 Malignant neoplasm of mouth, unspecified: Secondary | ICD-10-CM | POA: Diagnosis not present

## 2019-06-09 DIAGNOSIS — Z87891 Personal history of nicotine dependence: Secondary | ICD-10-CM | POA: Diagnosis not present

## 2019-06-09 DIAGNOSIS — J984 Other disorders of lung: Secondary | ICD-10-CM | POA: Diagnosis not present

## 2019-06-25 DIAGNOSIS — K219 Gastro-esophageal reflux disease without esophagitis: Secondary | ICD-10-CM | POA: Diagnosis not present

## 2019-06-25 DIAGNOSIS — I2699 Other pulmonary embolism without acute cor pulmonale: Secondary | ICD-10-CM | POA: Diagnosis not present

## 2019-06-25 DIAGNOSIS — E871 Hypo-osmolality and hyponatremia: Secondary | ICD-10-CM | POA: Diagnosis not present

## 2019-06-25 DIAGNOSIS — J690 Pneumonitis due to inhalation of food and vomit: Secondary | ICD-10-CM | POA: Diagnosis not present

## 2019-06-25 DIAGNOSIS — Z483 Aftercare following surgery for neoplasm: Secondary | ICD-10-CM | POA: Diagnosis not present

## 2019-06-25 DIAGNOSIS — E785 Hyperlipidemia, unspecified: Secondary | ICD-10-CM | POA: Diagnosis not present

## 2019-06-25 DIAGNOSIS — Z48817 Encounter for surgical aftercare following surgery on the skin and subcutaneous tissue: Secondary | ICD-10-CM | POA: Diagnosis not present

## 2019-06-25 DIAGNOSIS — I10 Essential (primary) hypertension: Secondary | ICD-10-CM | POA: Diagnosis not present

## 2019-06-25 DIAGNOSIS — C050 Malignant neoplasm of hard palate: Secondary | ICD-10-CM | POA: Diagnosis not present

## 2019-06-26 DIAGNOSIS — Z48817 Encounter for surgical aftercare following surgery on the skin and subcutaneous tissue: Secondary | ICD-10-CM | POA: Diagnosis not present

## 2019-06-26 DIAGNOSIS — K219 Gastro-esophageal reflux disease without esophagitis: Secondary | ICD-10-CM | POA: Diagnosis not present

## 2019-06-26 DIAGNOSIS — J690 Pneumonitis due to inhalation of food and vomit: Secondary | ICD-10-CM | POA: Diagnosis not present

## 2019-06-26 DIAGNOSIS — I10 Essential (primary) hypertension: Secondary | ICD-10-CM | POA: Diagnosis not present

## 2019-06-26 DIAGNOSIS — E785 Hyperlipidemia, unspecified: Secondary | ICD-10-CM | POA: Diagnosis not present

## 2019-06-26 DIAGNOSIS — I2699 Other pulmonary embolism without acute cor pulmonale: Secondary | ICD-10-CM | POA: Diagnosis not present

## 2019-06-26 DIAGNOSIS — Z483 Aftercare following surgery for neoplasm: Secondary | ICD-10-CM | POA: Diagnosis not present

## 2019-06-26 DIAGNOSIS — C050 Malignant neoplasm of hard palate: Secondary | ICD-10-CM | POA: Diagnosis not present

## 2019-06-26 DIAGNOSIS — E871 Hypo-osmolality and hyponatremia: Secondary | ICD-10-CM | POA: Diagnosis not present

## 2019-07-01 DIAGNOSIS — I2699 Other pulmonary embolism without acute cor pulmonale: Secondary | ICD-10-CM | POA: Diagnosis not present

## 2019-07-01 DIAGNOSIS — C050 Malignant neoplasm of hard palate: Secondary | ICD-10-CM | POA: Diagnosis not present

## 2019-07-01 DIAGNOSIS — E871 Hypo-osmolality and hyponatremia: Secondary | ICD-10-CM | POA: Diagnosis not present

## 2019-07-01 DIAGNOSIS — J690 Pneumonitis due to inhalation of food and vomit: Secondary | ICD-10-CM | POA: Diagnosis not present

## 2019-07-01 DIAGNOSIS — Z48817 Encounter for surgical aftercare following surgery on the skin and subcutaneous tissue: Secondary | ICD-10-CM | POA: Diagnosis not present

## 2019-07-01 DIAGNOSIS — Z483 Aftercare following surgery for neoplasm: Secondary | ICD-10-CM | POA: Diagnosis not present

## 2019-07-01 DIAGNOSIS — E785 Hyperlipidemia, unspecified: Secondary | ICD-10-CM | POA: Diagnosis not present

## 2019-07-01 DIAGNOSIS — I10 Essential (primary) hypertension: Secondary | ICD-10-CM | POA: Diagnosis not present

## 2019-07-01 DIAGNOSIS — K219 Gastro-esophageal reflux disease without esophagitis: Secondary | ICD-10-CM | POA: Diagnosis not present

## 2019-07-03 DIAGNOSIS — Z48817 Encounter for surgical aftercare following surgery on the skin and subcutaneous tissue: Secondary | ICD-10-CM | POA: Diagnosis not present

## 2019-07-03 DIAGNOSIS — K219 Gastro-esophageal reflux disease without esophagitis: Secondary | ICD-10-CM | POA: Diagnosis not present

## 2019-07-03 DIAGNOSIS — C050 Malignant neoplasm of hard palate: Secondary | ICD-10-CM | POA: Diagnosis not present

## 2019-07-03 DIAGNOSIS — I2699 Other pulmonary embolism without acute cor pulmonale: Secondary | ICD-10-CM | POA: Diagnosis not present

## 2019-07-03 DIAGNOSIS — Z483 Aftercare following surgery for neoplasm: Secondary | ICD-10-CM | POA: Diagnosis not present

## 2019-07-03 DIAGNOSIS — I10 Essential (primary) hypertension: Secondary | ICD-10-CM | POA: Diagnosis not present

## 2019-07-03 DIAGNOSIS — E871 Hypo-osmolality and hyponatremia: Secondary | ICD-10-CM | POA: Diagnosis not present

## 2019-07-03 DIAGNOSIS — E785 Hyperlipidemia, unspecified: Secondary | ICD-10-CM | POA: Diagnosis not present

## 2019-07-03 DIAGNOSIS — J690 Pneumonitis due to inhalation of food and vomit: Secondary | ICD-10-CM | POA: Diagnosis not present

## 2019-07-08 DIAGNOSIS — I2699 Other pulmonary embolism without acute cor pulmonale: Secondary | ICD-10-CM | POA: Diagnosis not present

## 2019-07-08 DIAGNOSIS — E871 Hypo-osmolality and hyponatremia: Secondary | ICD-10-CM | POA: Diagnosis not present

## 2019-07-08 DIAGNOSIS — K219 Gastro-esophageal reflux disease without esophagitis: Secondary | ICD-10-CM | POA: Diagnosis not present

## 2019-07-08 DIAGNOSIS — J690 Pneumonitis due to inhalation of food and vomit: Secondary | ICD-10-CM | POA: Diagnosis not present

## 2019-07-08 DIAGNOSIS — E785 Hyperlipidemia, unspecified: Secondary | ICD-10-CM | POA: Diagnosis not present

## 2019-07-08 DIAGNOSIS — Z483 Aftercare following surgery for neoplasm: Secondary | ICD-10-CM | POA: Diagnosis not present

## 2019-07-08 DIAGNOSIS — Z48817 Encounter for surgical aftercare following surgery on the skin and subcutaneous tissue: Secondary | ICD-10-CM | POA: Diagnosis not present

## 2019-07-08 DIAGNOSIS — C050 Malignant neoplasm of hard palate: Secondary | ICD-10-CM | POA: Diagnosis not present

## 2019-07-08 DIAGNOSIS — I10 Essential (primary) hypertension: Secondary | ICD-10-CM | POA: Diagnosis not present

## 2019-07-09 DIAGNOSIS — Z9889 Other specified postprocedural states: Secondary | ICD-10-CM | POA: Diagnosis not present

## 2019-07-09 DIAGNOSIS — R1312 Dysphagia, oropharyngeal phase: Secondary | ICD-10-CM | POA: Diagnosis not present

## 2019-07-10 DIAGNOSIS — E871 Hypo-osmolality and hyponatremia: Secondary | ICD-10-CM | POA: Diagnosis not present

## 2019-07-10 DIAGNOSIS — Z483 Aftercare following surgery for neoplasm: Secondary | ICD-10-CM | POA: Diagnosis not present

## 2019-07-10 DIAGNOSIS — Z48817 Encounter for surgical aftercare following surgery on the skin and subcutaneous tissue: Secondary | ICD-10-CM | POA: Diagnosis not present

## 2019-07-10 DIAGNOSIS — I2699 Other pulmonary embolism without acute cor pulmonale: Secondary | ICD-10-CM | POA: Diagnosis not present

## 2019-07-10 DIAGNOSIS — E785 Hyperlipidemia, unspecified: Secondary | ICD-10-CM | POA: Diagnosis not present

## 2019-07-10 DIAGNOSIS — C050 Malignant neoplasm of hard palate: Secondary | ICD-10-CM | POA: Diagnosis not present

## 2019-07-10 DIAGNOSIS — I10 Essential (primary) hypertension: Secondary | ICD-10-CM | POA: Diagnosis not present

## 2019-07-10 DIAGNOSIS — J690 Pneumonitis due to inhalation of food and vomit: Secondary | ICD-10-CM | POA: Diagnosis not present

## 2019-07-10 DIAGNOSIS — K219 Gastro-esophageal reflux disease without esophagitis: Secondary | ICD-10-CM | POA: Diagnosis not present

## 2019-07-15 DIAGNOSIS — J690 Pneumonitis due to inhalation of food and vomit: Secondary | ICD-10-CM | POA: Diagnosis not present

## 2019-07-15 DIAGNOSIS — I10 Essential (primary) hypertension: Secondary | ICD-10-CM | POA: Diagnosis not present

## 2019-07-15 DIAGNOSIS — Z483 Aftercare following surgery for neoplasm: Secondary | ICD-10-CM | POA: Diagnosis not present

## 2019-07-15 DIAGNOSIS — C050 Malignant neoplasm of hard palate: Secondary | ICD-10-CM | POA: Diagnosis not present

## 2019-07-15 DIAGNOSIS — I2699 Other pulmonary embolism without acute cor pulmonale: Secondary | ICD-10-CM | POA: Diagnosis not present

## 2019-07-15 DIAGNOSIS — Z48817 Encounter for surgical aftercare following surgery on the skin and subcutaneous tissue: Secondary | ICD-10-CM | POA: Diagnosis not present

## 2019-07-15 DIAGNOSIS — K219 Gastro-esophageal reflux disease without esophagitis: Secondary | ICD-10-CM | POA: Diagnosis not present

## 2019-07-15 DIAGNOSIS — E785 Hyperlipidemia, unspecified: Secondary | ICD-10-CM | POA: Diagnosis not present

## 2019-07-15 DIAGNOSIS — E871 Hypo-osmolality and hyponatremia: Secondary | ICD-10-CM | POA: Diagnosis not present

## 2019-07-17 DIAGNOSIS — K219 Gastro-esophageal reflux disease without esophagitis: Secondary | ICD-10-CM | POA: Diagnosis not present

## 2019-07-17 DIAGNOSIS — E785 Hyperlipidemia, unspecified: Secondary | ICD-10-CM | POA: Diagnosis not present

## 2019-07-17 DIAGNOSIS — Z483 Aftercare following surgery for neoplasm: Secondary | ICD-10-CM | POA: Diagnosis not present

## 2019-07-17 DIAGNOSIS — Z48817 Encounter for surgical aftercare following surgery on the skin and subcutaneous tissue: Secondary | ICD-10-CM | POA: Diagnosis not present

## 2019-07-17 DIAGNOSIS — I10 Essential (primary) hypertension: Secondary | ICD-10-CM | POA: Diagnosis not present

## 2019-07-17 DIAGNOSIS — J690 Pneumonitis due to inhalation of food and vomit: Secondary | ICD-10-CM | POA: Diagnosis not present

## 2019-07-17 DIAGNOSIS — E871 Hypo-osmolality and hyponatremia: Secondary | ICD-10-CM | POA: Diagnosis not present

## 2019-07-17 DIAGNOSIS — C050 Malignant neoplasm of hard palate: Secondary | ICD-10-CM | POA: Diagnosis not present

## 2019-07-17 DIAGNOSIS — I2699 Other pulmonary embolism without acute cor pulmonale: Secondary | ICD-10-CM | POA: Diagnosis not present

## 2019-07-22 DIAGNOSIS — Z483 Aftercare following surgery for neoplasm: Secondary | ICD-10-CM | POA: Diagnosis not present

## 2019-07-22 DIAGNOSIS — E785 Hyperlipidemia, unspecified: Secondary | ICD-10-CM | POA: Diagnosis not present

## 2019-07-22 DIAGNOSIS — I10 Essential (primary) hypertension: Secondary | ICD-10-CM | POA: Diagnosis not present

## 2019-07-22 DIAGNOSIS — I2699 Other pulmonary embolism without acute cor pulmonale: Secondary | ICD-10-CM | POA: Diagnosis not present

## 2019-07-22 DIAGNOSIS — C050 Malignant neoplasm of hard palate: Secondary | ICD-10-CM | POA: Diagnosis not present

## 2019-07-22 DIAGNOSIS — J690 Pneumonitis due to inhalation of food and vomit: Secondary | ICD-10-CM | POA: Diagnosis not present

## 2019-07-22 DIAGNOSIS — K219 Gastro-esophageal reflux disease without esophagitis: Secondary | ICD-10-CM | POA: Diagnosis not present

## 2019-07-22 DIAGNOSIS — Z48817 Encounter for surgical aftercare following surgery on the skin and subcutaneous tissue: Secondary | ICD-10-CM | POA: Diagnosis not present

## 2019-07-22 DIAGNOSIS — E871 Hypo-osmolality and hyponatremia: Secondary | ICD-10-CM | POA: Diagnosis not present

## 2019-07-25 DIAGNOSIS — C050 Malignant neoplasm of hard palate: Secondary | ICD-10-CM | POA: Diagnosis not present

## 2019-07-25 DIAGNOSIS — J690 Pneumonitis due to inhalation of food and vomit: Secondary | ICD-10-CM | POA: Diagnosis not present

## 2019-07-25 DIAGNOSIS — E871 Hypo-osmolality and hyponatremia: Secondary | ICD-10-CM | POA: Diagnosis not present

## 2019-07-25 DIAGNOSIS — E785 Hyperlipidemia, unspecified: Secondary | ICD-10-CM | POA: Diagnosis not present

## 2019-07-25 DIAGNOSIS — Z48817 Encounter for surgical aftercare following surgery on the skin and subcutaneous tissue: Secondary | ICD-10-CM | POA: Diagnosis not present

## 2019-07-25 DIAGNOSIS — Z483 Aftercare following surgery for neoplasm: Secondary | ICD-10-CM | POA: Diagnosis not present

## 2019-07-25 DIAGNOSIS — I2699 Other pulmonary embolism without acute cor pulmonale: Secondary | ICD-10-CM | POA: Diagnosis not present

## 2019-07-25 DIAGNOSIS — I10 Essential (primary) hypertension: Secondary | ICD-10-CM | POA: Diagnosis not present

## 2019-07-25 DIAGNOSIS — K219 Gastro-esophageal reflux disease without esophagitis: Secondary | ICD-10-CM | POA: Diagnosis not present

## 2019-08-01 DIAGNOSIS — R739 Hyperglycemia, unspecified: Secondary | ICD-10-CM | POA: Diagnosis not present

## 2019-08-01 DIAGNOSIS — C069 Malignant neoplasm of mouth, unspecified: Secondary | ICD-10-CM | POA: Diagnosis not present

## 2019-08-01 DIAGNOSIS — I129 Hypertensive chronic kidney disease with stage 1 through stage 4 chronic kidney disease, or unspecified chronic kidney disease: Secondary | ICD-10-CM | POA: Diagnosis not present

## 2019-08-01 DIAGNOSIS — R259 Unspecified abnormal involuntary movements: Secondary | ICD-10-CM | POA: Diagnosis not present

## 2019-08-01 DIAGNOSIS — R251 Tremor, unspecified: Secondary | ICD-10-CM | POA: Diagnosis not present

## 2019-08-01 DIAGNOSIS — C0680 Malignant neoplasm of overlapping sites of unspecified parts of mouth: Secondary | ICD-10-CM | POA: Diagnosis not present

## 2019-08-01 DIAGNOSIS — R634 Abnormal weight loss: Secondary | ICD-10-CM | POA: Diagnosis not present

## 2019-08-01 DIAGNOSIS — I10 Essential (primary) hypertension: Secondary | ICD-10-CM | POA: Diagnosis not present

## 2019-08-04 DIAGNOSIS — R1312 Dysphagia, oropharyngeal phase: Secondary | ICD-10-CM | POA: Diagnosis not present

## 2019-08-20 DIAGNOSIS — Z483 Aftercare following surgery for neoplasm: Secondary | ICD-10-CM | POA: Diagnosis not present

## 2019-08-20 DIAGNOSIS — C069 Malignant neoplasm of mouth, unspecified: Secondary | ICD-10-CM | POA: Diagnosis not present

## 2019-09-12 DIAGNOSIS — I129 Hypertensive chronic kidney disease with stage 1 through stage 4 chronic kidney disease, or unspecified chronic kidney disease: Secondary | ICD-10-CM | POA: Diagnosis not present

## 2019-09-12 DIAGNOSIS — R251 Tremor, unspecified: Secondary | ICD-10-CM | POA: Diagnosis not present

## 2019-09-12 DIAGNOSIS — I10 Essential (primary) hypertension: Secondary | ICD-10-CM | POA: Diagnosis not present

## 2019-10-22 DIAGNOSIS — C4442 Squamous cell carcinoma of skin of scalp and neck: Secondary | ICD-10-CM | POA: Diagnosis not present

## 2019-10-22 DIAGNOSIS — C760 Malignant neoplasm of head, face and neck: Secondary | ICD-10-CM | POA: Diagnosis not present

## 2019-10-22 DIAGNOSIS — C069 Malignant neoplasm of mouth, unspecified: Secondary | ICD-10-CM | POA: Diagnosis not present

## 2019-11-05 DIAGNOSIS — C069 Malignant neoplasm of mouth, unspecified: Secondary | ICD-10-CM | POA: Diagnosis not present

## 2019-11-05 DIAGNOSIS — R918 Other nonspecific abnormal finding of lung field: Secondary | ICD-10-CM | POA: Diagnosis not present

## 2019-11-05 DIAGNOSIS — I7090 Unspecified atherosclerosis: Secondary | ICD-10-CM | POA: Diagnosis not present

## 2019-12-04 DIAGNOSIS — Z20822 Contact with and (suspected) exposure to covid-19: Secondary | ICD-10-CM | POA: Diagnosis not present

## 2019-12-04 DIAGNOSIS — Z01812 Encounter for preprocedural laboratory examination: Secondary | ICD-10-CM | POA: Diagnosis not present

## 2019-12-04 DIAGNOSIS — E785 Hyperlipidemia, unspecified: Secondary | ICD-10-CM | POA: Diagnosis not present

## 2019-12-04 DIAGNOSIS — J449 Chronic obstructive pulmonary disease, unspecified: Secondary | ICD-10-CM | POA: Diagnosis not present

## 2019-12-04 DIAGNOSIS — Z87891 Personal history of nicotine dependence: Secondary | ICD-10-CM | POA: Diagnosis not present

## 2019-12-04 DIAGNOSIS — C770 Secondary and unspecified malignant neoplasm of lymph nodes of head, face and neck: Secondary | ICD-10-CM | POA: Diagnosis not present

## 2019-12-04 DIAGNOSIS — C069 Malignant neoplasm of mouth, unspecified: Secondary | ICD-10-CM | POA: Diagnosis not present

## 2019-12-04 DIAGNOSIS — D62 Acute posthemorrhagic anemia: Secondary | ICD-10-CM | POA: Diagnosis not present

## 2019-12-04 DIAGNOSIS — Z7901 Long term (current) use of anticoagulants: Secondary | ICD-10-CM | POA: Diagnosis not present

## 2019-12-04 DIAGNOSIS — K219 Gastro-esophageal reflux disease without esophagitis: Secondary | ICD-10-CM | POA: Diagnosis not present

## 2019-12-04 DIAGNOSIS — I10 Essential (primary) hypertension: Secondary | ICD-10-CM | POA: Diagnosis not present

## 2019-12-04 DIAGNOSIS — I5181 Takotsubo syndrome: Secondary | ICD-10-CM | POA: Diagnosis not present

## 2019-12-09 DIAGNOSIS — K219 Gastro-esophageal reflux disease without esophagitis: Secondary | ICD-10-CM | POA: Diagnosis not present

## 2019-12-09 DIAGNOSIS — D62 Acute posthemorrhagic anemia: Secondary | ICD-10-CM | POA: Diagnosis not present

## 2019-12-09 DIAGNOSIS — R911 Solitary pulmonary nodule: Secondary | ICD-10-CM | POA: Diagnosis not present

## 2019-12-09 DIAGNOSIS — E785 Hyperlipidemia, unspecified: Secondary | ICD-10-CM | POA: Diagnosis not present

## 2019-12-09 DIAGNOSIS — I10 Essential (primary) hypertension: Secondary | ICD-10-CM | POA: Diagnosis not present

## 2019-12-09 DIAGNOSIS — R590 Localized enlarged lymph nodes: Secondary | ICD-10-CM | POA: Diagnosis not present

## 2019-12-09 DIAGNOSIS — Z87891 Personal history of nicotine dependence: Secondary | ICD-10-CM | POA: Diagnosis not present

## 2019-12-09 DIAGNOSIS — C069 Malignant neoplasm of mouth, unspecified: Secondary | ICD-10-CM | POA: Diagnosis not present

## 2019-12-09 DIAGNOSIS — Z7901 Long term (current) use of anticoagulants: Secondary | ICD-10-CM | POA: Diagnosis not present

## 2019-12-09 DIAGNOSIS — J449 Chronic obstructive pulmonary disease, unspecified: Secondary | ICD-10-CM | POA: Diagnosis not present

## 2019-12-09 DIAGNOSIS — C770 Secondary and unspecified malignant neoplasm of lymph nodes of head, face and neck: Secondary | ICD-10-CM | POA: Diagnosis not present

## 2019-12-29 DIAGNOSIS — C069 Malignant neoplasm of mouth, unspecified: Secondary | ICD-10-CM | POA: Diagnosis not present

## 2019-12-29 DIAGNOSIS — Z87891 Personal history of nicotine dependence: Secondary | ICD-10-CM | POA: Diagnosis not present

## 2019-12-29 DIAGNOSIS — C770 Secondary and unspecified malignant neoplasm of lymph nodes of head, face and neck: Secondary | ICD-10-CM | POA: Diagnosis not present

## 2020-01-12 DIAGNOSIS — I889 Nonspecific lymphadenitis, unspecified: Secondary | ICD-10-CM | POA: Diagnosis not present

## 2020-01-12 DIAGNOSIS — R590 Localized enlarged lymph nodes: Secondary | ICD-10-CM | POA: Diagnosis not present

## 2020-01-12 DIAGNOSIS — R221 Localized swelling, mass and lump, neck: Secondary | ICD-10-CM | POA: Diagnosis not present

## 2020-01-13 ENCOUNTER — Ambulatory Visit
Admission: RE | Admit: 2020-01-13 | Discharge: 2020-01-13 | Disposition: A | Discharge status: Home / Self Care | Payer: Self-pay | Source: Ambulatory Visit | Attending: Radiation Oncology | Admitting: Radiation Oncology

## 2020-01-13 ENCOUNTER — Other Ambulatory Visit: Payer: Self-pay | Admitting: Radiation Oncology

## 2020-01-13 DIAGNOSIS — C069 Malignant neoplasm of mouth, unspecified: Secondary | ICD-10-CM

## 2020-01-19 ENCOUNTER — Other Ambulatory Visit: Payer: Self-pay

## 2020-01-21 ENCOUNTER — Ambulatory Visit
Admission: RE | Admit: 2020-01-21 | Discharge: 2020-01-21 | Disposition: A | Discharge status: Home / Self Care | Payer: Self-pay | Source: Ambulatory Visit | Attending: Hematology and Oncology | Admitting: Hematology and Oncology

## 2020-01-21 ENCOUNTER — Other Ambulatory Visit: Payer: Self-pay

## 2020-01-21 ENCOUNTER — Other Ambulatory Visit: Payer: Self-pay | Admitting: Hematology and Oncology

## 2020-01-21 DIAGNOSIS — R918 Other nonspecific abnormal finding of lung field: Secondary | ICD-10-CM

## 2020-01-21 NOTE — Progress Notes (Signed)
Patient discussed in Head and Neck TB. 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 However we also noted several lung nodules highly concerning for malignant involvement. We will order a PET scan for further evaluation as agreed by the team. If there is any concern for metastatic disease, we will have to arrange for med onc appointment   .

## 2020-01-21 NOTE — Progress Notes (Signed)
Oncology Nurse Navigator Documentation  Placed introductory call to new referral patient Amanda Rivers. I spoke with her daughter [name]  Introduced myself as the H&N oncology nurse navigator that works with Dr. Isidore Moos to whom he has been referred by Dr. Vicie Mutters.  Shee confirmed understanding of referral.  Briefly explained my role as her navigator, provided my contact information.   Confirmed understanding of upcoming appts. She will have a telephone consult on 12/3. She informed me that she will be on the phone call as well as her daughter and her mother.   I explained the purpose of a dental evaluation prior to starting RT, indicated she would be contacted by [facility] to arrange an appt.    I encouraged them to call with questions/concerns as she moves forward with appts and procedures.    She verbalized understanding of information provided, expressed appreciation for my call.   Navigator Initial Assessment . Employment Status: retired . Currently on FMLA / STD:no . Living Situation: She lives alone in [place].  . Support System: Daughter [name] who lives in the [place] area and Chelsea Primus who lives in the [place] area.  Marland Kitchen PCP: none . PCD:  . Financial Concerns:no . Transportation Needs: no . Sensory Deficits:no . Language Barriers/Interpreter Needed:  no . Ambulation Needs: no . DME Used in Home: no . Psychosocial Needs:  no . Concerns/Needs Understanding Cancer:  addressed/answered by navigator to best of ability . Self-Expressed Needs: no   Harlow Asa RN, BSN, OCN Head & Neck Oncology Nurse [place] at [facility] Phone # [phone]  Fax # [phone]

## 2020-01-22 NOTE — Progress Notes (Signed)
Radiation Oncology         [phone] ________________________________  Initial Outpatient Consultation by telephone.  The patient opted for telemedicine with her family to maximize safety during the pandemic.  MyChart video was not obtainable.   Name: Amanda Rivers MRN: [id number]  Date: 01/23/2020  DOB: 1945-10-31  WU:[id number], No Pcp Per  Philomena Doheny, MD   REFERRING PHYSICIAN: Philomena Doheny, MD  DIAGNOSIS:  C31.0    ICD-10-CM   1. 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 female who presented to Dr. Katheren Shams, [place], with complaint of oral lesions. A biopsy of the left upper alveolar region was performed on 04/11/2019 and revealed squamous cell carcinoma.   Subsequently, the patient saw Dr. Vicie Mutters, who performed a left infrastructure maxillectomy/palatectomy, left selective neck dissection, and left temporalis myelogenous flap for palate reconstruction on the date of 06/05/2019. Pathology from the procedure revealed a tumor size of 4.2 cm with an approximately 7 mm depth of invasion. Histologic type was grade 2 moderately differentiated squamous cell carcinoma. No lymphovascular invasion was identified. However, there was some focal perineural invasion present. Nine lymph nodes were examined, all of which were negative for carcinoma. Given that the margins were free of carcinoma, there was only isolated PNI focus, and the nodes were negative, radiation therapy was deferred at that time.  The patient remained under surveillance until 10/22/2019 when a right-sided nodal mass was noted on  examination. Fine needle aspiration of the mass was performed at that time and revealed squamous cell carcinoma.   The patient underwent a right modified radical neck dissection with sacrifice of sternocleidomastoid and spinal accessory nerve on the date of 12/09/2019. Pathology from the procedure revealed metastatic squamous cell carcinoma involving at least two of twenty-three lymph nodes. The largest tumor mass involved at least one lymph node (5.5 cm) but could have represented several matted lymph nodes. There was associated lymph node tissue, but much of it was associated with desmoplastic and/or fibrous tissue. Extranodal extension was presumed based upon that finding. The second involved lymph node also had a dense desmoplastic reaction and had at least 2 mm of extranodal extension.   Pertinent imaging thus far includes: 1. Maxillofacial CT scan performed on 05/08/2019 revealing dehiscence of the posterior floor of the left maxillary sinus with heterogeneous soft tissue extending through the defect. There was also noted to be overlying maxillary sinus mucosal thickening. 2. CT scan of chest on 11/05/2019 that showed similar to minimally increased size of left upper lobe spiculated nodules. Aeration of the bilateral lower lungs had improved. 3. CT scan of neck/thyroid on 11/05/2019 that showed conglomerate of necrotic right upper cervical lymph nodes that were consistent with nodal metastases. There was no abnormal enhancement along the resection margin to suggest primary recurrence. 4.  CT angio chest on 12/11/2019 revealed: 1. No evidence of acute pulmonary thromboembolic disease.  2. No significant change in pulmonary nodules or mediastinal adenopathy.  3. Postsurgical changes of the right neck for tumor resection with packing material and drain in place.  4. Ancillary findings as above.  Swallowing issues, if  any: Denied  Weight Changes: Denies any recent weight loss  Tobacco history,  if any: reduced smoking substantially 3 years with occasional cigarettes; quit completely last year.  ETOH abuse, if any: she does not drink ETOH at all, used to drink beer.  Anticipates future surgery on right neck at [facility] due to new neck mass since surgery, recent bx of that tissue has initially been nondiagnostic.  Pending a PET scan is recommended at our tumor board earlier this week.  Previous history of lung cancer, pT1pN0, resected in 2008 (10 lymph nodes all negative) LUNG, RIGHT UPPER LOBE, WEDGE RESECTION:  - MODERATELY DIFFERENTIATED ADENOCARCINOMA.   PREVIOUS RADIATION THERAPY: No  PAST MEDICAL HISTORY:  has a past medical history of Hypertension, Malignant neoplasm of bronchus and lung, unspecified site, and Reflux.    PAST SURGICAL HISTORY: Past Surgical History:  Procedure Laterality Date  . R Vats R upper lobectomy with node dissection  06/21/06    FAMILY HISTORY: family history is not on file.  SOCIAL HISTORY:  reports that she quit smoking about 13 years ago. She has never used smokeless tobacco. She reports previous alcohol use. She reports that she does not use drugs.  ALLERGIES: Patient has no known allergies.  MEDICATIONS:  Current Outpatient Medications  Medication Sig Dispense Refill  . amLODipine-olmesartan (AZOR) 10-40 MG per tablet Take 1 tablet by mouth daily.      . celecoxib (CELEBREX) 200 MG capsule Take 200 mg by mouth daily.    Marland Kitchen dexlansoprazole (DEXILANT) 60 MG capsule Take 60 mg by mouth daily.      . montelukast (SINGULAIR) 10 MG tablet Take 10 mg by mouth daily as needed.    . nebivolol (BYSTOLIC) 10 MG tablet Take 10 mg by mouth daily.      . pantoprazole (PROTONIX) 40 MG tablet Take 40 mg by mouth daily.    . primidone (MYSOLINE) 50 MG tablet Take 50 mg by mouth 3 (three) times daily.    . rosuvastatin (CRESTOR) 20 MG tablet Take 20 mg by mouth daily.    Marland Kitchen spironolactone (ALDACTONE) 25 MG tablet Take 25 mg by mouth 2 (two) times  daily.       No current facility-administered medications for this encounter.    REVIEW OF SYSTEMS:  Notable for that above.   PHYSICAL EXAM:  vitals were not taken for this visit.   General: Alert and oriented, in no acute distress     LABORATORY DATA:  Lab Results  Component Value Date   WBC 5.1 05/07/2019   HGB 14.3 05/08/2019   HCT 42.0 05/08/2019   MCV 78.6 (L) 05/07/2019   PLT 336 05/07/2019   CMP     Component Value Date/Time   NA 132 (L) 05/08/2019 0006   K 4.4 05/08/2019 0006   CL 100 05/08/2019 0006   CO2 21 (L) 05/07/2019 2333   GLUCOSE 102 (H) 05/08/2019 0006   BUN 18 05/08/2019 0006   CREATININE 1.00 05/08/2019 0006   CALCIUM 9.6 05/07/2019 2333   PROT 7.3 06/10/2008 1800   ALBUMIN 4.3 06/10/2008 1800   AST 19 06/10/2008 1800   ALT 13 06/10/2008 1800   ALKPHOS 83 06/10/2008 1800   BILITOT 0.8 06/10/2008 1800   GFRNONAA 44 (L) 05/07/2019 2333   GFRAA 51 (L) 05/07/2019 2333      No results found for: TSH  TSH at [facility] 12-04-19: 0.848   RADIOGRAPHY:  As above  IMPRESSION/PLAN:  This is a delightful patient with  recurrent head and neck cancer (maxillary sinus cancer with contralateral neck recurrence).  She has been discussed at our tumor board.  Recommendation was made for PET scan to evaluate nonspecific findings in her chest and complete her staging.  She has another surgery pending due to concern about another recurrence in the neck.  We will try to expedite her PET scan.  It is hard to know, for sure, the role of adjuvant radiation therapy, without having full staging studies.  That said, today I talked in detail with the patient and her family about the possibility of adjuvant radiation therapy to the head and neck region for local regional control.     We discussed the potential risks, benefits, and side effects of radiotherapy. We talked in detail about acute and late effects. We discussed that some of the most bothersome acute effects may be  mucositis, dysgeusia, salivary changes, skin irritation, hair loss, dehydration, weight loss and fatigue. We talked about late effects which include but are not necessarily limited to dysphagia, hypothyroidism, brain injury, nerve injury, vascular injury, spinal cord injury, xerostomia, trismus, neck edema, and potential injury to any of the tissues in the head and neck region. No guarantees of treatment were given.  The patient is enthusiastic about proceeding with treatment if warranted. I look forward to participating in the patient's care.    Anderson Malta, our navigator, will try to move up her PET scan and will be in touch with the [facility] team regarding surgical plans and disposition thereafter.  I have also sent a group note to coordinate care, including Dr. Vicie Mutters (ENT).  We are also referring her to dentistry for preradiation evaluation.  She might need a bite block/tongue positioner, depending on team consensus regarding whether to treat her maxillary sinus.  In my opinion, the most critical tissues to treat are the right neck> left neck> left maxillary sinus.  At minimum I think we should treat the bilateral neck.  We discussed measures to reduce the risk of infection during the COVID-19 pandemic.  She has not yet been vaccinated.  We talked about the pros and cons of getting vaccinated and I strongly recommend that she consider this.  She and her family are in favor of doing so and one of her daughters will arrange vaccination at their local pharmacy soon; left arm recommended for the vaccination.  This encounter was provided by telemedicine platform; patient desired telemedicine during pandemic precautions.  Telephone was used as Radiographer, therapeutic was not obtainable. The patient has given verbal consent for this type of encounter and has been advised to only accept a meeting of this type in a secure network environment. On date of service, in total, I spent 65 minutes on this encounter.   The  attendants for this meeting include Eppie Gibson  and Velta Rockholt During the encounter, Eppie Gibson was located at [facility] Radiation Oncology Department.  Eleena Grater was located at home.   __________________________________________   Eppie Gibson, MD  This document serves as a record of services personally performed by Eppie Gibson, MD. It was created on his behalf by Clerance Lav, a trained medical scribe. The creation of this record is based on the scribe's personal observations and the provider's statements to them. This document has been checked and approved by the attending provider.

## 2020-01-22 NOTE — Progress Notes (Signed)
Head and Neck Cancer Location of Tumor / Histology: Contralateral metastasis of a left maxillary sinus carcinoma (metastatic squamous cell carcinoma)  Patient presented symptoms of: 05/12/2019 (visit with Dr. Izora Gala): "For several months she has had difficulty wearing her top denture. She has not been able to use it. She had a biopsy of her left upper alveolar region which was sent to [facility] and was found to be positive for squamous cell carcinoma, HPV negative. She was in the hospital last week due to intractable pain and difficulty swallowing. She has lost about 30 pounds in the past month."   Biopsies revealed:  12/09/2019  10/22/2019 Specimen A. Final Diagnosis   NECK, FINE NEEDLE ASPIRATION I (SMEAR):              Squamous cell carcinoma.              See comment. Specimen A. Diagnosis Comment   The patient's history of invasive keratinizing squamous cell carcinoma is noted. The aspirate smears and cell block demonstrate sheets of keratinizing atypical epithelial cells with increased nuclear: cytoplasmic ratios and pleomorphic nuclei, consistent with squamous cell carcinoma.  04/11/2019 ([facility]: [facility]) Maxillofacial Pathology Specimen: Soft Tissue, Other Squamous cell carcinoma (incisional biopsy, DOI indeterminate, p16 and HPV16/18 negative, incisional biopsy)  Nutrition Status Yes No Comments  Weight changes? []  [x]    Swallowing concerns? []  [x]    PEG? []  [x]     Referrals Yes No Comments  Social Work? [x]  []    Dentistry? [x]  []    Swallowing therapy? [x]  []    Nutrition? [x]  []    Med/Onc? []  [x]  Not currently, but may change after patient's PET scan to investigate lung nodule   Safety Issues Yes No Comments  Prior radiation? []  [x]    Pacemaker/ICD? []  [x]    Possible current pregnancy? []  [x]  Postmenopausal   Is the patient on methotrexate? []  [x]     Tobacco/Marijuana/Snuff/ETOH use:  She was a smoker and drinker up until  recently; Denies any recreational drug use  Past/Anticipated interventions by otolaryngology, if any:  12/09/2019 Dr. Jeneen Rinks D. [place] Right modified radical neck dissection with sacrifice of sternocleidomastoid and spinal accessory nerve  06/05/2019 Dr. Jeneen Rinks D. Vicie Mutters -Left infrastructure maxillectomy/palatectomy -Left selective neck dissection, zones 2 and 3 -Left temporalis myelogenous flap for palate reconstruction -Dental extractions  Past/Anticipated interventions by medical oncology, if any: No referral currently placed, but per Dr. Lyn Hollingshead Iruku's note from 01/21/2020: "Patient discussed in Head and Neck TB. 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 However we also noted several lung nodules highly concerning for malignant involvement. We will order a PET scan for further evaluation as agreed by the team. If there is any concern for metastatic disease, we will have to arrange for med onc appointment"   Current Complaints / other details:   History of lung cancer as well (managed surgically).

## 2020-01-23 ENCOUNTER — Other Ambulatory Visit: Payer: Self-pay

## 2020-01-23 ENCOUNTER — Encounter: Payer: Self-pay | Admitting: Radiation Oncology

## 2020-01-23 ENCOUNTER — Ambulatory Visit
Admission: RE | Admit: 2020-01-23 | Discharge: 2020-01-23 | Disposition: A | Discharge status: Home / Self Care | Payer: Medicare HMO | Source: Ambulatory Visit | Attending: Radiation Oncology | Admitting: Radiation Oncology

## 2020-01-23 DIAGNOSIS — C7989 Secondary malignant neoplasm of other specified sites: Secondary | ICD-10-CM

## 2020-01-23 DIAGNOSIS — C310 Malignant neoplasm of maxillary sinus: Secondary | ICD-10-CM | POA: Diagnosis not present

## 2020-01-23 NOTE — Progress Notes (Signed)
Oncology Nurse Navigator Documentation  Met with patient during initial telephone consult with Ms. Amanda Rivers.  Her daughter [name] participated via telephone as well.  . Further introduced myself as her/their Navigator, explained my role as a member of the Care Team. . Assisted with post-consult appt scheduling, including pending PET.  . They are aware that they will receive a phone call from Dr. Raynelle Dick office for evaluation.  . They verbalized understanding of information provided. . I encouraged them to call with questions/concerns moving forward.  Harlow Asa, RN, BSN, OCN Head & Neck Oncology Nurse [place] at [place] [phone]

## 2020-01-26 ENCOUNTER — Encounter: Payer: Self-pay | Admitting: Radiation Oncology

## 2020-01-26 DIAGNOSIS — R251 Tremor, unspecified: Secondary | ICD-10-CM | POA: Diagnosis not present

## 2020-01-26 DIAGNOSIS — C0680 Malignant neoplasm of overlapping sites of unspecified parts of mouth: Secondary | ICD-10-CM | POA: Diagnosis not present

## 2020-01-26 DIAGNOSIS — I129 Hypertensive chronic kidney disease with stage 1 through stage 4 chronic kidney disease, or unspecified chronic kidney disease: Secondary | ICD-10-CM | POA: Diagnosis not present

## 2020-01-26 DIAGNOSIS — C310 Malignant neoplasm of maxillary sinus: Secondary | ICD-10-CM | POA: Insufficient documentation

## 2020-01-26 DIAGNOSIS — N184 Chronic kidney disease, stage 4 (severe): Secondary | ICD-10-CM | POA: Diagnosis not present

## 2020-01-26 DIAGNOSIS — R259 Unspecified abnormal involuntary movements: Secondary | ICD-10-CM | POA: Diagnosis not present

## 2020-01-30 DIAGNOSIS — I6523 Occlusion and stenosis of bilateral carotid arteries: Secondary | ICD-10-CM | POA: Diagnosis not present

## 2020-01-30 DIAGNOSIS — C069 Malignant neoplasm of mouth, unspecified: Secondary | ICD-10-CM | POA: Diagnosis not present

## 2020-02-02 ENCOUNTER — Telehealth: Payer: Self-pay | Admitting: Hematology and Oncology

## 2020-02-02 DIAGNOSIS — S1093XD Contusion of unspecified part of neck, subsequent encounter: Secondary | ICD-10-CM | POA: Diagnosis not present

## 2020-02-02 NOTE — Telephone Encounter (Signed)
Received a new staff msg to schedule Amanda Rivers to see Dr. Chryl Heck. Ms. Zakrzewski has been scheduled to see Dr. Chryl Heck on 12/28 at 11am. I cld and lft the appt date and time on both the pt and daughter's vm.

## 2020-02-03 ENCOUNTER — Ambulatory Visit (HOSPITAL_COMMUNITY): Payer: Medicare HMO

## 2020-02-04 ENCOUNTER — Ambulatory Visit (HOSPITAL_COMMUNITY)
Admission: RE | Admit: 2020-02-04 | Discharge: 2020-02-04 | Disposition: A | Discharge status: Home / Self Care | Payer: Medicare HMO | Source: Ambulatory Visit | Attending: Hematology and Oncology | Admitting: Hematology and Oncology

## 2020-02-04 ENCOUNTER — Inpatient Hospital Stay: Payer: Medicare HMO | Attending: Hematology

## 2020-02-04 ENCOUNTER — Other Ambulatory Visit: Payer: Self-pay

## 2020-02-04 DIAGNOSIS — I2510 Atherosclerotic heart disease of native coronary artery without angina pectoris: Secondary | ICD-10-CM | POA: Insufficient documentation

## 2020-02-04 DIAGNOSIS — C7802 Secondary malignant neoplasm of left lung: Secondary | ICD-10-CM | POA: Insufficient documentation

## 2020-02-04 DIAGNOSIS — Z87891 Personal history of nicotine dependence: Secondary | ICD-10-CM | POA: Diagnosis not present

## 2020-02-04 DIAGNOSIS — Z23 Encounter for immunization: Secondary | ICD-10-CM | POA: Diagnosis not present

## 2020-02-04 DIAGNOSIS — Z85818 Personal history of malignant neoplasm of other sites of lip, oral cavity, and pharynx: Secondary | ICD-10-CM | POA: Diagnosis not present

## 2020-02-04 DIAGNOSIS — Z79899 Other long term (current) drug therapy: Secondary | ICD-10-CM | POA: Insufficient documentation

## 2020-02-04 DIAGNOSIS — C770 Secondary and unspecified malignant neoplasm of lymph nodes of head, face and neck: Secondary | ICD-10-CM | POA: Diagnosis not present

## 2020-02-04 DIAGNOSIS — C310 Malignant neoplasm of maxillary sinus: Secondary | ICD-10-CM | POA: Diagnosis present

## 2020-02-04 DIAGNOSIS — R918 Other nonspecific abnormal finding of lung field: Secondary | ICD-10-CM | POA: Diagnosis not present

## 2020-02-04 LAB — GLUCOSE, CAPILLARY: Glucose-Capillary: 114 mg/dL — ABNORMAL HIGH (ref 70–99)

## 2020-02-04 MED ORDER — FLUDEOXYGLUCOSE F - 18 (FDG) INJECTION
7.7400 | Freq: Once | INTRAVENOUS | Status: AC | PRN
  Administered 2020-02-04: 7.74 via INTRAVENOUS

## 2020-02-06 ENCOUNTER — Telehealth: Payer: Self-pay | Admitting: Pulmonary Disease

## 2020-02-06 ENCOUNTER — Institutional Professional Consult (permissible substitution): Payer: Self-pay | Admitting: Pulmonary Disease

## 2020-02-06 NOTE — Telephone Encounter (Signed)
----- Message from Garner Nash, DO sent at 02/06/2020  1:55 PM EST ----- Regarding: New Consult - Lung mass/nodule slot Leigh or Patrice,   Please see message below and schedule appointment with me the week of 27th in nodule slot. Please call [name] patients daughter.  Thanks  Leory Plowman    ----- Message ----- From: Ernst Spell, RN Sent: 02/06/2020  12:17 PM EST To: Eppie Gibson, MD, Elie Confer, CMA, #  Yes, Ms. Birman's anxiety is very high and her children do not live in [place]. They would like more notice regarding appointments. I really tried to convince the daughter how important this appointment was today, but she was adamant that they could not make it today.  Could your schedulers call her daughter Tykisha Areola with the new appointment instead of the patient Roena, or I'm happy to do it.    Thanks for scheduling her so quickly with you, but it looks like it will need to be the week of 12/27.   Anderson Malta  ----- Message ----- From: Garner Nash, DO Sent: 02/06/2020  11:59 AM EST To: Eppie Gibson, MD, Ernst Spell, RN, #  Did she give a reason?   I will be back on the 27th. Neither myself or Dr. Lamonte Sakai are in clinic next next week.   Its either today or the patient can we worked in to see me the week of the 27th.   Thanks  Leory Plowman    ----- Message ----- From: Ernst Spell, RN Sent: 02/06/2020   9:28 AM EST To: Eppie Gibson, MD, Zola Button, RN, #  Ms. Qazi was scheduled to see Dr. Valeta Harms today at 3:00. I called and spoke to her daughter today and they are declining to go. I have explained the importance of this appointment and the possible delay in care if they don't attend, but they continue to decline to come to your appointment today.   Dr. Valeta Harms I know that you worked this lady in and will be off next week. How should we proceed? Should she see one of your partners next week, or will she need to wait to see you until  you return? I just talked to the schedulers at your office and they tell me that appointments are very tight and suggested I send this inbasket to ask your advice.   Thanks, Anderson Malta  ----- Message ----- From: Garner Nash, DO Sent: 02/05/2020   6:44 AM EST To: Eppie Gibson, MD, Ernst Spell, RN, #  Yes myself or Rob can biopsy this.  I am out of town next week all week and back on the 27th.   But I am happy to get my partner to bronch them next week possibly  I will see if I can get them worked into clinic to see me or Rob today or tomorrow.   Brad    ----- Message ----- From: Eppie Gibson, MD Sent: 02/04/2020   6:02 PM EST To: Ernst Spell, RN, Zola Button, RN, #  Arletha Pili, I agree. Leroy Sea, can you take a look and let us know if this is something you can bx soon? If so, please reply to all, so Anderson Malta can give patient and family a heads up Anderson Malta, just say that we feel it's important to know whether some abnormal tissue in her left lung is inflammatory or something we need to treat as cancer).  I am out of office tomorrow, back Fri AM. Thanks  everyone! Sarah ----- Message ----- From: Benay Pike, MD Sent: 02/04/2020   5:04 PM EST To: Eppie Gibson, MD  Judson Roch,  Let me know what you think. PET suspicious for lung mets.  Thanks, ----- Message ----- From: Interface, Rad Results In Sent: 02/04/2020   3:58 PM EST To: Benay Pike, MD

## 2020-02-06 NOTE — Telephone Encounter (Signed)
Cancelled appt for 12/17 - left message for patient's daughter [name] to r/s appt to the week of 12/27 in lung nodule slot -pr

## 2020-02-06 NOTE — Progress Notes (Signed)
Oncology Nurse Navigator Documentation  I spoke with Amanda Rivers's daughter Amanda Rivers yesterday regarding the recommendations for a biopsy of her mother's lung before the start of treatment for her cancer at [facility]. I explained that there was an area on her PET scan that was concerning and Dr. Isidore Moos had contacted Dr. Valeta Harms regarding completing a biopsy. Amanda Rivers was scheduled for an appointment with Dr. Valeta Harms today for evaluation. I called [name] today to make her aware of the appointment and at this time she and her mother decline to see Dr. Valeta Harms. Her mother has had a lot of anxiety with multiple appointments and would like several days notice before appointments are scheduled. I have notified Dr. Isidore Moos and Dr. Valeta Harms via inbasket of the above information. I will let [name] know when the appointment has been rescheduled.   Harlow Asa RN, BSN, OCN Head & Neck Oncology Nurse [place] at [place] [facility] Phone # [phone]  Fax # [phone]

## 2020-02-09 ENCOUNTER — Ambulatory Visit
Admission: RE | Admit: 2020-02-09 | Discharge: 2020-02-09 | Disposition: A | Discharge status: Home / Self Care | Payer: Medicare HMO | Source: Ambulatory Visit | Attending: Radiation Oncology | Admitting: Radiation Oncology

## 2020-02-09 ENCOUNTER — Other Ambulatory Visit: Payer: Self-pay

## 2020-02-09 DIAGNOSIS — C310 Malignant neoplasm of maxillary sinus: Secondary | ICD-10-CM | POA: Insufficient documentation

## 2020-02-09 DIAGNOSIS — Z510 Encounter for antineoplastic radiation therapy: Secondary | ICD-10-CM | POA: Insufficient documentation

## 2020-02-09 NOTE — Progress Notes (Signed)
Oncology Nurse Navigator Documentation  To provide support, encouragement and care continuity, met with Ms. Oka during her CT [facility]. She was unaccompanied. I also discussed an upcoming appointment with Dr. Valeta Harms to evaluate for bronchoscopy. She is aware that she will be receiving a phone call to get her scheduled for next week.  She tolerated procedure without difficulty, denied questions/concerns.     I encouraged her or her family to call me prior to 02/17/20 [place].  Harlow Asa RN, BSN, OCN Head & Neck Oncology Nurse [place] at [facility] Phone # [phone]  Fax # [phone]

## 2020-02-10 DIAGNOSIS — C310 Malignant neoplasm of maxillary sinus: Secondary | ICD-10-CM | POA: Diagnosis not present

## 2020-02-10 DIAGNOSIS — Z510 Encounter for antineoplastic radiation therapy: Secondary | ICD-10-CM | POA: Diagnosis not present

## 2020-02-10 NOTE — Telephone Encounter (Signed)
Leigh spoke with patient's daughter - pt doesn't do well with afternoon appts. Dr. Valeta Harms are you will to double book her on your schedule during the week of 02/16/2020? We are also going to have to arrange transportation.-pr

## 2020-02-11 NOTE — Telephone Encounter (Signed)
Yes, Ok to double book for a 9AM or 10AM slot and ask them to come early or be on time. Thanks [employer]

## 2020-02-16 ENCOUNTER — Encounter: Payer: Self-pay | Admitting: Licensed Clinical Social Worker

## 2020-02-16 ENCOUNTER — Other Ambulatory Visit: Payer: Self-pay

## 2020-02-16 ENCOUNTER — Ambulatory Visit: Payer: Medicare HMO | Admitting: Radiation Oncology

## 2020-02-16 DIAGNOSIS — C310 Malignant neoplasm of maxillary sinus: Secondary | ICD-10-CM

## 2020-02-16 NOTE — Telephone Encounter (Signed)
Attempted to call pt's daughter [name] but unable to reach. Left message for her to return call.

## 2020-02-16 NOTE — Telephone Encounter (Signed)
Can we follow up on this? Should have an appt with me this week if possible  Thanks  BLI

## 2020-02-16 NOTE — Telephone Encounter (Signed)
lmtcb for pt's daughter, [name].   I preemptively scheduled pt appt for 12.29.21 at 1000.

## 2020-02-16 NOTE — Progress Notes (Signed)
[place] Work  Initial Assessment  Amanda Rivers is a 74 y.o. year old female. Clinical Social Work was referred by Engineer, site for assessment of psychosocial needs.   SDOH (Social Determinants of Health) assessments performed: Yes SDOH Interventions   Flowsheet Row Most Recent Value  SDOH Interventions   Food Insecurity Interventions Intervention Not Indicated  Housing Interventions Intervention Not Indicated  Transportation Interventions Other (Comment)  [Connected to [facility] transportation already]      Distress Screen completed: No No flowsheet data found.    Family/Social Information:  . Housing Arrangement: patient lives alone . Family members/support persons in your life? Family- daughter & granddaughter . Transportation concerns: no, has [facility] transportation  . Employment: Retired. Income source: Conservation officer, historic buildings . Financial concerns: No o Type of concern: None . Food access concerns: no, granddaughter helps bring groceries . Services Currently in place:  [facility] transportation  Coping/ Adjustment to diagnosis: . Patient understands treatment plan and what happens next? yes . Concerns about diagnosis and/or treatment: A little overwhelmed by number of appointments, but taking it step by step . Patient reported stressors: Adjusting to my illness . Current coping skills/ strengths: Capable of independent living and Supportive family/friends    SUMMARY: Current SDOH Barriers:  . No significant SDOH barriers noted today  Clinical Social Work Clinical Goal(s):  Marland Kitchen No social work goals at this time  Interventions: . Discussed common feeling and emotions when being diagnosed with cancer, and the importance of support during treatment . Informed patient of the support team roles and support services at [facility] . Provided CSW contact information and encouraged patient to call with any questions or concerns   Follow Up Plan: Patient will contact CSW with  any support or resource needs Patient verbalizes understanding of plan: Yes    Christeen Douglas , LCSW

## 2020-02-17 ENCOUNTER — Ambulatory Visit
Admission: RE | Admit: 2020-02-17 | Discharge: 2020-02-17 | Disposition: A | Discharge status: Home / Self Care | Payer: Medicare HMO | Source: Ambulatory Visit | Attending: Radiation Oncology | Admitting: Radiation Oncology

## 2020-02-17 ENCOUNTER — Other Ambulatory Visit: Payer: Self-pay

## 2020-02-17 ENCOUNTER — Encounter: Payer: Self-pay | Admitting: Hematology and Oncology

## 2020-02-17 ENCOUNTER — Inpatient Hospital Stay (HOSPITAL_BASED_OUTPATIENT_CLINIC_OR_DEPARTMENT_OTHER): Payer: Medicare HMO | Admitting: Hematology and Oncology

## 2020-02-17 ENCOUNTER — Inpatient Hospital Stay: Payer: Medicare HMO

## 2020-02-17 VITALS — BP 133/73 | HR 87 | Temp 97.9°F | Resp 18 | Ht 71.25 in | Wt 147.1 lb

## 2020-02-17 DIAGNOSIS — C7989 Secondary malignant neoplasm of other specified sites: Secondary | ICD-10-CM | POA: Diagnosis not present

## 2020-02-17 DIAGNOSIS — Z87891 Personal history of nicotine dependence: Secondary | ICD-10-CM

## 2020-02-17 DIAGNOSIS — C310 Malignant neoplasm of maxillary sinus: Secondary | ICD-10-CM | POA: Diagnosis not present

## 2020-02-17 DIAGNOSIS — Z79899 Other long term (current) drug therapy: Secondary | ICD-10-CM | POA: Diagnosis not present

## 2020-02-17 DIAGNOSIS — R918 Other nonspecific abnormal finding of lung field: Secondary | ICD-10-CM | POA: Diagnosis not present

## 2020-02-17 DIAGNOSIS — Z23 Encounter for immunization: Secondary | ICD-10-CM | POA: Diagnosis not present

## 2020-02-17 DIAGNOSIS — I10 Essential (primary) hypertension: Secondary | ICD-10-CM

## 2020-02-17 DIAGNOSIS — C770 Secondary and unspecified malignant neoplasm of lymph nodes of head, face and neck: Secondary | ICD-10-CM | POA: Diagnosis not present

## 2020-02-17 DIAGNOSIS — C7802 Secondary malignant neoplasm of left lung: Secondary | ICD-10-CM | POA: Diagnosis not present

## 2020-02-17 DIAGNOSIS — Z510 Encounter for antineoplastic radiation therapy: Secondary | ICD-10-CM | POA: Diagnosis not present

## 2020-02-17 NOTE — Telephone Encounter (Signed)
Patient's daughter called back this morning, appointment on 12.29.21 does not work due to patient needing transportation as daughter does not live in [place]. Daughter a little upset appointment was made and had they not called back today patient would have had a no show but patient wouldn't have known about appointment.   New appointment scheduled for 03/02/20 with Dr. Valeta Harms at [place] as patient needs appointments in the morning.   Nothing further needed at this time.

## 2020-02-17 NOTE — Progress Notes (Signed)
[facility] CONSULT NOTE  Patient Care Team: Patient, No Pcp Per as PCP - General (General Practice)  CHIEF COMPLAINTS/PURPOSE OF CONSULTATION:  Recurrent maxillary H and N cancer  ASSESSMENT & PLAN:  No problem-specific Assessment & Plan notes found for this encounter.  No orders of the defined types were placed in this encounter.  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  Pathology from this revealed metastatic squamous cell carcinoma involving at least 2 out of 23 lymph nodes. Largest tumor mass involves at least 1 lymph node but could have represented several matted lymph nodes.  There was associated desmoplastic and or fibrous tissue and there was some concern for extranodal extension based on that finding.  The second involved lymph node also had dense desmoplastic reaction and had at least 2 mm of extranodal extension.  She then had PET/CT imaging which showed some concerning activity in the left upper lung nodule along with the a couple other lung nodules.  She was scheduled to see Dr. Valeta Harms from pulmonary and had bronchoscopy guided biopsy before her visit but she canceled her appointment and refused an earlier appointment and is now scheduled to see her on 03/03/2019. I explained to her that is very important to keep the appointment with him, go to bronchoscopy and establish the etiology for the left upper lobe lung nodule.  I explained to her that if this is indeed metastatic from squamous cell carcinoma of the maxilla then this is considered and advanced disease and radiation may help local regional control however will not help with metastatic disease and she may need treatment such as chemotherapy or chemoimmunotherapy based upon the PD-L1 status.  I have discussed with our nurse navigator  to send PD-L1 on the left upper lung nodule biopsy. If this is indeed another cancer since she previously had lung cancer and she was a smoker, then we may have to discuss surgery versus SBRT versus systemic therapy based on the extent of needed surgical resection.  Although given extranodal extension of her tumor, chemotherapy may add benefit to the currently undergoing radiation, given concerns for metastatic disease, older age and patients preference, we will not proceed with chemotherapy at this time.  I have discussed with her about weekly cisplatin which is considered along with relations in patients with local recurrence, discussed side effects of cisplatin.  She is not interested in proceeding with chemotherapy at this time.  She understands benefits and risks. She was advised to return to my clinic after her bronchoscopy and surgical pathology results are available. She expressed understanding a follow-up appointment has been made.    Thank you for consulting [place] in the care of this patient.  Please do not hesitate to contact us with any additional questions or concerns.  HISTORY OF PRESENTING ILLNESS:  Amanda Rivers 74 y.o. female is here because of newly diagnosed SCC of the Head and Neck.  Amanda Rivers is a 74 y.o. female who presented to Dr. Katheren Shams, [place], with complaint of oral lesions. A biopsy of the left upper alveolar region was performed on 04/11/2019 and revealed squamous cell carcinoma.   Subsequently, the patient saw Dr. Vicie Mutters, who performed a left infrastructure maxillectomy/palatectomy, left selective neck dissection, and left temporalis myelogenous flap for palate reconstruction on the date of 06/05/2019. Pathology from the procedure revealed a tumor size of  4.2 cm with an approximately 7 mm depth of invasion. Histologic type was grade 2 moderately differentiated squamous cell carcinoma. No lymphovascular invasion was identified. However, there was some focal perineural  invasion present. Nine lymph nodes were examined, all of which were negative for carcinoma. Given that the margins were free of carcinoma, there was only isolated PNI focus, and the nodes were negative, radiation therapy was deferred at that time.  The patient remained under surveillance until 10/22/2019 when a right-sided nodal mass was noted on examination. Fine needle aspiration of the mass was performed at that time and revealed squamous cell carcinoma.   The patient underwent a right modified radical neck dissection with sacrifice of sternocleidomastoid and spinal accessory nerve on the date of 12/09/2019. Pathology from the procedure revealed metastatic squamous cell carcinoma involving at least two of twenty-three lymph nodes. The largest tumor mass involved at least one lymph node (5.5 cm) but could have represented several matted lymph nodes. There was associated lymph node tissue, but much of it was associated with desmoplastic and/or fibrous tissue. Extranodal extension was presumed based upon that finding. The second involved lymph node also had a dense desmoplastic reaction and had at least 2 mm of extranodal extension.    1. Maxillofacial CT scan performed on 05/08/2019 revealing dehiscence of the posterior floor of the left maxillary sinus with heterogeneous soft tissue extending through the defect. There was also noted to be overlying maxillary sinus mucosal thickening. 2. CT scan of chest on 11/05/2019 that showed similar to minimally increased size of left upper lobe spiculated nodules. Aeration of the bilateral lower lungs had improved. 3. CT scan of neck/thyroid on 11/05/2019 that showed conglomerate of necrotic right upper cervical lymph nodes that were consistent with nodal metastases. There was no abnormal enhancement along the resection margin to suggest primary recurrence. 4.  CT angio chest on 12/11/2019 revealed: 1. No evidence of acute pulmonary thromboembolic disease.  2.  No significant change in pulmonary nodules or mediastinal adenopathy.  3. Postsurgical changes of the right neck for tumor resection with packing material and drain in place.  4. Ancillary findings as above. 5.  PET scan hypermetabolic pulmonary lesions suspicious for metastatic disease, most concerning one in LUL. No findings to suggest mediastinal or hilar LN. No evidence of abdominal/pelvic metastatic disease or osseous mets. She is to see Dr Valeta Harms from pulmonary, she refused an earlier appointment and canceled one for tomorrow, rescheduled to 03/03/19 We will have to biopsy the LUL nodule and if this is metastatic, then we will consider chemotherapy or chemoimmunotherapy based on PDL1 results.  She is here for an appointment by herself. She feels well, she is going to radiation every day She denies any complaints for me today. She feels well overall. She is very worried about the possibility of doing chemo. No concerning ROS for me today  REVIEW OF SYSTEMS:   Constitutional: Denies fevers, chills or abnormal night sweats Eyes: Denies blurriness of vision, double vision or watery eyes Ears, nose, mouth, throat, and face: Denies mucositis or sore throat Respiratory: Denies cough, dyspnea or wheezes Cardiovascular: Denies palpitation, chest discomfort or lower extremity swelling Gastrointestinal:  Denies nausea, heartburn or change in bowel habits Skin: Denies abnormal skin rashes Lymphatics: Denies new lymphadenopathy or easy bruising Neurological:Denies numbness, tingling or new weaknesses Behavioral/Psych: Mood is stable, no new changes  All other systems were reviewed with the patient and are negative.  MEDICAL HISTORY:  Past Medical History:  Diagnosis Date  .  Hypertension   . Malignant neoplasm of bronchus and lung, unspecified site   . Reflux     SURGICAL HISTORY: Past Surgical History:  Procedure Laterality Date  . R Vats R upper lobectomy with node dissection  06/21/06     SOCIAL HISTORY: Social History   Socioeconomic History  . Marital status: Divorced    Spouse name: Not on file  . Number of children: Not on file  . Years of education: Not on file  . Highest education level: Not on file  Occupational History  . Not on file  Tobacco Use  . Smoking status: Former Smoker    Quit date: 09/25/2006    Years since quitting: 13.4  . Smokeless tobacco: Never Used  Vaping Use  . Vaping Use: Never used  Substance and Sexual Activity  . Alcohol use: Not Currently    Comment: occasional beer  . Drug use: Never  . Sexual activity: Not Currently  Other Topics Concern  . Not on file  Social History Narrative  . Not on file   Social Determinants of Health   Financial Resource Strain: Not on file  Food Insecurity: No Food Insecurity  . Worried About Charity fundraiser in the Last Year: Never true  . Ran Out of Food in the Last Year: Never true  Transportation Needs: No Transportation Needs  . Lack of Transportation (Medical): No  . Lack of Transportation (Non-Medical): No  Physical Activity: Not on file  Stress: Not on file  Social Connections: Not on file  Intimate Partner Violence: Not on file    FAMILY HISTORY: Family History  Problem Relation Age of Onset  . Brain cancer Paternal Uncle   . Brain cancer Paternal Uncle     ALLERGIES:  has No Known Allergies.  MEDICATIONS:  Current Outpatient Medications  Medication Sig Dispense Refill  . amLODipine-olmesartan (AZOR) 10-40 MG tablet Take 1 tablet by mouth daily.    Marland Kitchen dexlansoprazole (DEXILANT) 60 MG capsule Take 60 mg by mouth daily.    . nebivolol (BYSTOLIC) 10 MG tablet Take 10 mg by mouth daily.    . rosuvastatin (CRESTOR) 20 MG tablet Take 20 mg by mouth daily.    Marland Kitchen spironolactone (ALDACTONE) 25 MG tablet Take 25 mg by mouth 2 (two) times daily.    . celecoxib (CELEBREX) 200 MG capsule Take 200 mg by mouth daily. (Patient not taking: Reported on 02/17/2020)    . montelukast  (SINGULAIR) 10 MG tablet Take 10 mg by mouth daily as needed. (Patient not taking: Reported on 02/17/2020)    . pantoprazole (PROTONIX) 40 MG tablet Take 40 mg by mouth daily. (Patient not taking: Reported on 02/17/2020)    . primidone (MYSOLINE) 50 MG tablet Take 50 mg by mouth 3 (three) times daily. (Patient not taking: Reported on 02/17/2020)     No current facility-administered medications for this visit.     PHYSICAL EXAMINATION: ECOG PERFORMANCE STATUS: 0 - Asymptomatic  Vitals:   02/17/20 1056  BP: 133/73  Pulse: 87  Resp: 18  Temp: 97.9 F (36.6 C)  SpO2: 95%   Filed Weights   02/17/20 1056  Weight: 147 lb 1.6 oz (66.7 kg)    GENERAL:alert, no distress and comfortable SKIN: skin color, texture, turgor are normal, no rashes or significant lesions EYES: normal, conjunctiva are pink and non-injected, sclera clear OROPHARYNX:no exudate, no erythema and lips, buccal mucosa, and tongue normal. No teeth. NECK: some fullness of right neck, no palpable abnormalities LYMPH:  no palpable lymphadenopathy in the cervical, axillary or inguinal LUNGS: clear to auscultation and percussion with normal breathing effort HEART: regular rate & rhythm and no murmurs and no lower extremity edema ABDOMEN:abdomen soft, non-tender and normal bowel sounds Musculoskeletal:no cyanosis of digits and no clubbing  PSYCH: alert & oriented x 3 with fluent speech NEURO: no focal motor/sensory deficits  LABORATORY DATA:  I have reviewed the data as listed Lab Results  Component Value Date   WBC 5.1 05/07/2019   HGB 14.3 05/08/2019   HCT 42.0 05/08/2019   MCV 78.6 (L) 05/07/2019   PLT 336 05/07/2019     Chemistry      Component Value Date/Time   NA 132 (L) 05/08/2019 0006   K 4.4 05/08/2019 0006   CL 100 05/08/2019 0006   CO2 21 (L) 05/07/2019 2333   BUN 18 05/08/2019 0006   CREATININE 1.00 05/08/2019 0006      Component Value Date/Time   CALCIUM 9.6 05/07/2019 2333   ALKPHOS 83  06/10/2008 1800   AST 19 06/10/2008 1800   ALT 13 06/10/2008 1800   BILITOT 0.8 06/10/2008 1800       RADIOGRAPHIC STUDIES: I have personally reviewed the radiological images as listed and agreed with the findings in the report. NM PET Image Initial (PI) Skull Base To Thigh  Result Date: 02/04/2020 CLINICAL DATA:  Initial treatment strategy for pulmonary nodules. 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 EXAM: NUCLEAR MEDICINE PET SKULL BASE TO THIGH TECHNIQUE: 7.74 mCi F-18 FDG was injected intravenously. Full-ring PET imaging was performed from the skull base to thigh after the radiotracer. CT data was obtained and used for attenuation correction and anatomic localization. Fasting blood glucose: 114 mg/dl COMPARISON:  Outside CT [facility] dated 12/11/2019 FINDINGS: Mediastinal blood pool activity: SUV max 3.06 Liver activity: SUV max NA NECK: No hypermetabolic lymph nodes in the neck. Triangular area mixed attenuation in the left lower neck just above the surgical clips could be a postoperative fluid collection but it is hypermetabolic with SUV max of 4.26. Could not exclude necrotic tumor. Moderate symmetric diffuse hypermetabolism noted in the region of the true cords and arytenoids possibly due to phonation. Radiation changes also possible. Incidental CT findings: Severe/advanced coronary artery calcifications. CHEST: Pulmonary lesions are hypermetabolic. 12.5 x 10.0 mm left apical nodule on image number 83/4 is hypermetabolic with SUV max of 1.96. Two ill-defined sub solid nodular lesions in the left upper lobe on image number 22/2 are hypermetabolic. The more medial lesion has an SUV max of 4.18 and the more lateral lesion has an SUV max of 4.85. 3.5 x 2.5 cm lesion in the left upper lobe inferiorly is hypermetabolic with SUV max of 9.79. No other significant pulmonary lesions. Stable surgical changes from a probable right upper lobe lobectomy. 5.5 mm  right upper lung nodule on image 87/3 is weakly hypermetabolic with SUV max of 8.92. Given its small size is is somewhat worrisome. No enlarged or hypermetabolic mediastinal or hilar lymph nodes. Incidental CT findings: Stable vascular calcifications. ABDOMEN/PELVIS: No abnormal hypermetabolic activity within the liver, pancreas, adrenal glands, or spleen. No hypermetabolic lymph nodes in the abdomen or pelvis. Incidental CT findings: Advanced vascular calcifications. SKELETON: No significant bony findings. Incidental CT findings: none IMPRESSION: 1. Hypermetabolic pulmonary lesions suspicious for metastatic disease. The most concerning is the largest lesion in the left upper lobe adjacent to the major fissure. 2. No findings to suggest mediastinal or hilar  lymphadenopathy 3. No evidence of abdominal/pelvic metastatic disease or osseous metastatic disease. 4. Very symmetric hypermetabolism in the larynx possibly due to radiation or phonation. 5. Complex cystic appearing right lower neck lesion adjacent to the right lobe of the thyroid gland demonstrating hypermetabolism, likely postoperative change and or radiation. Recommend close observation. Electronically Signed   By: Marijo Sanes M.D.   On: 02/04/2020 15:56   All questions were answered. The patient knows to call the clinic with any problems, questions or concerns. I spent 60 minutes in the care of this patient including H and P, review of records, counseling and coordination of care.     Benay Pike, MD 02/17/2020 11:34 AM

## 2020-02-17 NOTE — Progress Notes (Signed)
Oncology Nurse Navigator Documentation  To provide support, encouragement and care continuity, met with Ms. Benjamin for her initial RT.    I reviewed the 2-step treatment process, answered questions.   Ms. Alatorre completed treatment without difficulty, denied questions/concerns.  I reviewed the registration/arrival procedure for subsequent treatments.  I encouraged her to call me with questions/concerns as tmts proceed.  I provided her with a detailed calendar of her upcoming appointments and went through them with her.    Harlow Asa RN, BSN, OCN Head & Neck Oncology Nurse [place] at [facility] Phone # [phone]  Fax # [phone]

## 2020-02-18 ENCOUNTER — Ambulatory Visit
Admission: RE | Admit: 2020-02-18 | Discharge: 2020-02-18 | Disposition: A | Discharge status: Home / Self Care | Payer: Medicare HMO | Source: Ambulatory Visit | Attending: Radiation Oncology | Admitting: Radiation Oncology

## 2020-02-18 ENCOUNTER — Ambulatory Visit: Payer: Medicare HMO | Admitting: Pulmonary Disease

## 2020-02-18 DIAGNOSIS — C310 Malignant neoplasm of maxillary sinus: Secondary | ICD-10-CM | POA: Diagnosis not present

## 2020-02-18 DIAGNOSIS — Z510 Encounter for antineoplastic radiation therapy: Secondary | ICD-10-CM | POA: Diagnosis not present

## 2020-02-19 ENCOUNTER — Ambulatory Visit
Admission: RE | Admit: 2020-02-19 | Discharge: 2020-02-19 | Disposition: A | Discharge status: Home / Self Care | Payer: Medicare HMO | Source: Ambulatory Visit | Attending: Radiation Oncology | Admitting: Radiation Oncology

## 2020-02-19 DIAGNOSIS — C310 Malignant neoplasm of maxillary sinus: Secondary | ICD-10-CM | POA: Diagnosis not present

## 2020-02-19 DIAGNOSIS — Z510 Encounter for antineoplastic radiation therapy: Secondary | ICD-10-CM | POA: Diagnosis not present

## 2020-02-23 ENCOUNTER — Ambulatory Visit: Payer: Medicare HMO

## 2020-02-24 ENCOUNTER — Ambulatory Visit
Admission: RE | Admit: 2020-02-24 | Discharge: 2020-02-24 | Disposition: A | Discharge status: Home / Self Care | Payer: Medicare HMO | Source: Ambulatory Visit | Attending: Radiation Oncology | Admitting: Radiation Oncology

## 2020-02-24 DIAGNOSIS — C310 Malignant neoplasm of maxillary sinus: Secondary | ICD-10-CM

## 2020-02-24 MED ORDER — SONAFINE EX EMUL
1.0000 "application " | Freq: Two times a day (BID) | CUTANEOUS | Status: DC
  Administered 2020-02-24: 1 via TOPICAL

## 2020-02-24 NOTE — Progress Notes (Signed)
Pt here for patient teaching.    Pt given Radiation and You booklet, Managing Acute Radiation Side Effects for Head and Neck Cancer handout, skin care instructions and Sonafine.    Reviewed areas of pertinence such as fatigue, mouth changes, skin changes, throat changes, earaches and taste changes .   Pt able to give teach back of to pat skin, use unscented/gentle soap and drink plenty of water,apply Sonafine bid and avoid applying anything to skin within 4 hours of treatment.   Pt demonstrated understanding and verbalizes understanding of information given and will contact nursing with any questions or concerns.    [email or web address]

## 2020-02-25 ENCOUNTER — Other Ambulatory Visit: Payer: Self-pay

## 2020-02-25 ENCOUNTER — Inpatient Hospital Stay: Payer: Medicare HMO | Attending: Hematology

## 2020-02-25 ENCOUNTER — Ambulatory Visit
Admission: RE | Admit: 2020-02-25 | Discharge: 2020-02-25 | Disposition: A | Discharge status: Home / Self Care | Payer: Medicare HMO | Source: Ambulatory Visit | Attending: Radiation Oncology | Admitting: Radiation Oncology

## 2020-02-25 DIAGNOSIS — C310 Malignant neoplasm of maxillary sinus: Secondary | ICD-10-CM | POA: Diagnosis not present

## 2020-02-25 DIAGNOSIS — Z23 Encounter for immunization: Secondary | ICD-10-CM | POA: Diagnosis not present

## 2020-02-25 NOTE — Progress Notes (Signed)
   Covid-19 Vaccination Clinic  Name:  [name]    MRN: [id number] DOB: 04/29/1945  02/25/2020  [name] was observed post Covid-19 immunization for 15 minutes without incident. She was provided with Vaccine Information Sheet and instruction to access the V-Safe system.   [name] was instructed to call 911 with any severe reactions post vaccine: Marland Kitchen Difficulty breathing  . Swelling of face and throat  . A fast heartbeat  . A bad rash all over body  . Dizziness and weakness   Immunizations Administered    Name Date Dose VIS Date Route   Pfizer COVID-19 Vaccine 02/25/2020  9:47 AM 0.3 mL 12/10/2019 Intramuscular   Manufacturer: [place]   Lot: [id number]   [place]: [address]-7

## 2020-02-26 ENCOUNTER — Ambulatory Visit
Admission: RE | Admit: 2020-02-26 | Discharge: 2020-02-26 | Disposition: A | Discharge status: Home / Self Care | Payer: Medicare HMO | Source: Ambulatory Visit | Attending: Radiation Oncology | Admitting: Radiation Oncology

## 2020-02-26 ENCOUNTER — Encounter: Payer: Self-pay | Admitting: General Practice

## 2020-02-26 ENCOUNTER — Other Ambulatory Visit: Payer: Self-pay

## 2020-02-26 ENCOUNTER — Ambulatory Visit: Payer: Medicare HMO | Admitting: Physical Therapy

## 2020-02-26 ENCOUNTER — Encounter: Payer: Self-pay | Admitting: Physical Therapy

## 2020-02-26 ENCOUNTER — Ambulatory Visit: Payer: Medicare HMO | Attending: Radiation Oncology

## 2020-02-26 DIAGNOSIS — M25611 Stiffness of right shoulder, not elsewhere classified: Secondary | ICD-10-CM | POA: Insufficient documentation

## 2020-02-26 DIAGNOSIS — M6281 Muscle weakness (generalized): Secondary | ICD-10-CM | POA: Insufficient documentation

## 2020-02-26 DIAGNOSIS — M25612 Stiffness of left shoulder, not elsewhere classified: Secondary | ICD-10-CM | POA: Insufficient documentation

## 2020-02-26 DIAGNOSIS — I69991 Dysphagia following unspecified cerebrovascular disease: Secondary | ICD-10-CM

## 2020-02-26 DIAGNOSIS — C310 Malignant neoplasm of maxillary sinus: Secondary | ICD-10-CM | POA: Insufficient documentation

## 2020-02-26 DIAGNOSIS — R293 Abnormal posture: Secondary | ICD-10-CM | POA: Diagnosis not present

## 2020-02-26 NOTE — Progress Notes (Signed)
Oncology Nurse Navigator Documentation  I met briefly with Ms. Amanda Rivers before her appointments with Garald Balding SLP and Allyson Sabal PT today. She is feeling well and managing her transportation. She denies concerns at this time. She knows to call me if she has any questions or concerns.  Harlow Asa RN, BSN, OCN Head & Neck Oncology Nurse [place] at [facility] Phone # [phone]  Fax # [phone]

## 2020-02-26 NOTE — Addendum Note (Signed)
Addended by: Wynelle Beckmann, Hilaria Ota L on: 02/26/2020 10:15 AM   Modules accepted: Orders

## 2020-02-26 NOTE — Patient Instructions (Signed)
SWALLOWING EXERCISES Do these until 6 months after your last day of radiation, then 2 times per week afterwards  1. Effortful Swallows - Press your tongue against the roof of your mouth for 3 seconds, then squeeze the muscles in your neck while you swallow your saliva or a sip of water - Repeat 10-15 times, 2-3 times a day, and use whenever you eat or drink  2. Masako Swallow - swallow with your tongue sticking out - Stick tongue out past your lips and gently bite tongue with your teeth - Swallow, while holding your tongue with your teeth - Repeat 10-15 times, 2-3 times a day *use a wet spoon if your mouth gets dry*  3. Pitch Raise - Repeat "he", once per second in as high of a pitch as you can - Repeat 20 times, 2-3 times a day  4. Mendelsohn Maneuver - "half swallow" exercise - Start to swallow, and keep your Adam's apple up by squeezing hard with the muscles of the throat - Hold the squeeze for 5-7 seconds and then relax - Repeat 10-15 times, 2-3 times a day *use a wet spoon if your mouth gets dry*  5. Chin pushback - Open your mouth  - Place your fist UNDER your chin near your neck - Tuck your chin and push back with your fist for 5 seconds - Repeat 10 times, 2-3 times a day

## 2020-02-26 NOTE — Therapy (Addendum)
[facility] [address] [place], [place], [address] Phone: [phone]   Fax:  [phone]  Speech Language Pathology Evaluation  Patient Details  Name: Amanda Rivers MRN: [id number] Date of Birth: 1945-11-23 Referring Provider (SLP): Eppie Gibson, MD   Encounter Date: 02/26/2020   End of Session - 02/26/20 1608    Visit Number 1    Number of Visits 7    Date for SLP Re-Evaluation 05/26/20    SLP Start Time 1000    SLP Stop Time  37    SLP Time Calculation (min) 40 min    Activity Tolerance Patient tolerated treatment well           Past Medical History:  Diagnosis Date  . Hypertension   . Malignant neoplasm of bronchus and lung, unspecified site   . Reflux     Past Surgical History:  Procedure Laterality Date  . R Vats R upper lobectomy with node dissection  06/21/06    There were no vitals filed for this visit.   Subjective Assessment - 02/26/20 1505    Subjective Pt denies overt s/sx aspiration with POs. Endentulous so pt eats primarily dys I foods.    Currently in Pain? No/denies              SLP Evaluation [facility] - 02/26/20 1505      SLP Visit Information   SLP Received On 02/26/20    Referring Provider (SLP) Eppie Gibson, MD    Onset Date Fall 2021    Medical Diagnosis maxillary sinus cancer      Subjective   Patient/Family Stated Goal swallow better      Prior Functional Status   Cognitive/Linguistic Baseline Within functional limits      Cognition   Overall Cognitive Status Difficult to assess    Difficult to assess due to --   time constraints     Auditory Comprehension   Overall Auditory Comprehension Appears within functional limits for tasks assessed      Verbal Expression   Overall Verbal Expression Appears within functional limits for tasks assessed      Oral Motor/Sensory Function   Overall Oral Motor/Sensory Function Appears within functional limits for tasks  assessed      Motor Speech   Overall Motor Speech Appears within functional limits for tasks assessed          Pt currently tolerates dys I-II diet and thin liquid. POs: Pt ate applesauce and drank water without overt s/s aspiration. Thyroid elevation appeared adequate, and swallows appeared timely. Pt's swallow deemed WNL/WFL for dys I-II and thin liquids at this time.   Because data states the risk for dysphagia during and after radiation treatment is high due to undergoing radiation tx, SLP taught pt about the possibility of reduced/limited ability for PO intake during rad tx. SLP encouraged pt to continue swallowing POs as far into rad tx as possible, Among other modifications for days when pt cannot functionally swallow, SLP talked about performing only non-swallowing tasks on the handout/HEP, and then adding swallowing tasks back in when it becomes possible to do so.  SLP educated pt re: changes to swallowing musculature after rad tx, and why adherence to dysphagia HEP provided today and PO consumption was necessary to inhibit muscle fibrosis following rad tx. Pt demonstrated understanding of these things to SLP.    SLP then developed a HEP for pt and pt was instructed how to perform exercises involving lingual, vocal, and pharyngeal  strengthening. SLP performed each exercise and pt return demonstrated each exercise. SLP ensured pt performance was correct prior to moving on to next exercise. Pt was instructed to complete this program 2 times a day, until 6 months after her last rad tx, then x2 a week after that.                  SLP Education - 02/26/20 1654    Education Details HEP procedure, late effects head/neck radiation on swallow function    Person(s) Educated Patient    Methods Explanation;Demonstration;Verbal cues    Comprehension Verbalized understanding;Returned demonstration;Verbal cues required;Need further instruction            SLP Short Term Goals -  02/26/20 1613      SLP SHORT TERM GOAL #1   Title pt will complete HEP with rare min A    Time 1    Period --   visits, for all STGs   Status New      SLP SHORT TERM GOAL #2   Title pt will tell SLP why pt is completing HEP with modified independence      SLP SHORT TERM GOAL #3   Title pt will describe 3 overt s/s aspiration PNA with modified independence    Time 3    Status New      SLP SHORT TERM GOAL #4   Title pt will tell SLP how a food journal could hasten return to a more normalized diet    Time 3    Status New            SLP Long Term Goals - 02/26/20 1618      SLP LONG TERM GOAL #1   Title pt will complete HEP with modified independence over two visits    Time 4    Period --   visits (7 total) for all LTGs   Status New      SLP LONG TERM GOAL #2   Title pt will describe how to modify HEP over time, and the timeline associated with reduction in HEP frequency with modified independence over two sessions    Time 6    Status New            Plan - 02/26/20 1609    Clinical Impression Statement At this time pt swallowing is deemed WNL/WFL with dys I-II foods and thin liquids - pt drank water and ate applesauce today without overt s/sx oral or pharyngeal dysphagia. SLP designed an individualized HEP for dysphagia and pt completed each exercise on their own with total cues faded to modified independence. Pt looks to be healed well from her April 2021 maxillectomy/palatectomy. There are no overt s/s aspiration reported by pt at this time. Data indicate that pt's swallow ability will likely decrease over the course of radiation therapy and could very well decline over time following conclusion of their radiation therapy due to muscle disuse atrophy and/or muscle fibrosis. Pt will cont to need to be seen by SLP in order to assess safety of PO intake, assess the need for recommending any objective swallow assessment, and ensuring pt correctly completes the individualized HEP.     Speech Therapy Frequency --   once approx every four weeks   Duration --   7 total sessions   Treatment/Interventions Aspiration precaution training;Pharyngeal strengthening exercises;Diet toleration management by SLP;Trials of upgraded texture/liquids;Internal/external aids;Patient/family education;SLP instruction and feedback    Potential to [place]  Exercise Plan provided today    Consulted and Agree with Plan of Care Patient           Patient will benefit from skilled therapeutic intervention in order to improve the following deficits and impairments:   Dysphagia following unspecified cerebrovascular disease - Plan: SLP plan of care cert/re-cert    Problem List Patient Active Problem List   Diagnosis Date Noted  . 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 Tremor 06/02/2019  . HTN (hypertension) 06/02/2019  . HLD (hyperlipidemia) 06/02/2019  . History of lung cancer 06/02/2019  . Chronic hyponatremia 06/02/2019  . GERD (gastroesophageal reflux disease) 06/02/2019  . Allergic rhinitis 06/02/2019    [facility] 02/26/2020, 4:55 PM  [facility] [address] [place], [place], [address] Phone: [phone]   Fax:  [phone]  Name: Amanda Rivers MRN: [id number] Date of Birth: [birth year]

## 2020-02-26 NOTE — Progress Notes (Signed)
[facility] CSW Progress Notes  Met w patient in [facility] and Neck Clinic at [facility].  Provided information on [facility] and [employer]. She lives alone, has some support from daughter (in [place]) and granddaughter (who lives in [place] and spends every other weekend w patient).  She likes to live alone - daughter has suggested she may need more support and should move into a care facility of some kind.  Patient does not want to leave her home.  Reports her most significant concern is being unable to eat and ropy saliva.  She does use nutritional supplements and has an adequate supply.  Per chart, she does have Medicaid which may allow her to access [facility] for more help at home if she desires.  CSW will call patient and discuss her options.  Edwyna Shell, LCSW Clinical Social Worker Phone:  [phone]

## 2020-02-26 NOTE — Progress Notes (Signed)
[facility] CSW Progress Notes  Call from daughter, [name].  Family would like her to be referred for [facility] so she could get more help at home.  Family lives out of town and is not able to assist as needed.  CSW will give application for [facility] to nurse navigator.  Edwyna Shell, LCSW Clinical Social Worker Phone:  [phone] Cell:  [phone]

## 2020-02-26 NOTE — Therapy (Addendum)
[place], [place], [address] Phone: [phone]   Fax:  [phone]  Physical Therapy Evaluation  Patient Details  Name: Amanda Rivers MRN: [id number] Date of Birth: 06-09-1945 Referring Provider (PT): Reita May Date: 02/26/2020   PT End of Session - 02/26/20 0835    Visit Number 1    Number of Visits 13   Date for PT Re-Evaluation 04/08/20    PT Start Time 0925    PT Stop Time 0957    PT Time Calculation (min) 32 min    Activity Tolerance Patient tolerated treatment well    Behavior During Therapy [facility] for tasks assessed/performed           Past Medical History:  Diagnosis Date  . Hypertension   . Malignant neoplasm of bronchus and lung, unspecified site   . Reflux     Past Surgical History:  Procedure Laterality Date  . R Vats R upper lobectomy with node dissection  06/21/06    There were no vitals filed for this visit.    Subjective Assessment - 02/26/20 0832    Subjective I am feeling pretty good today. I am not in any pain.    Pertinent History 06/05/19- L neck dissection, left infrastructure maxillectomy and palatectomy, L temporalis myelogenous flap for palate reconstruction, 10/22/19- R sided nodal mass noted on exam, 12/09/19- underwent R modified radical neck dissection with sacrifice of SCM and spinal accessory nerve- pathology revealed metastatic SCC, 02/04/20- pulmonary lesions suspicious for metastatic disease scheduled for lung biopsy on 03/02/20. Will receive radiation to left maxillary sinus and bilateral neck will complete 2/8    Patient Stated Goals to gain info from providers    Currently in Pain? No/denies    Pain Score 0-No pain              [facility] PT Assessment - 02/26/20 0001      Assessment   Medical Diagnosis maxillary carcinoma    Referring Provider (PT) Isidore Moos    Onset Date/Surgical Date 06/05/19   12/09/19   Hand Dominance Right    Prior Therapy  none      Precautions   Precautions Other (comment)    Precaution Comments at risk of lymphedema      Restrictions   Weight Bearing Restrictions No      Balance Screen   Has the patient fallen in the past 6 months No    Has the patient had a decrease in activity level because of a fear of falling?  No    Is the patient reluctant to leave their home because of a fear of falling?  No      Home Social worker Private residence    Living Arrangements Alone    Available Help at Discharge Family    Type of [place] Access Level entry    Home Layout One level      Prior Function   Level of [place] Retired    Leisure pt does not exercise currently      Cognition   Overall Cognitive Status Within Functional Limits for tasks assessed      Functional Tests   Functional tests Sit to Stand      Sit to Stand   Comments 6 reps with use of UEs for support - 13 is average for her age      Posture/Postural Control   Posture/Postural  Control Postural limitations    Postural Limitations Rounded Shoulders;Forward head      ROM / Strength   AROM / PROM / Strength AROM      AROM   Overall AROM  Deficits    Overall AROM Comments bilateral shoulder ROM very limited with R worse than left - pt has hardly any flexion on the R and about 20 degrees on the L. She reports this all began around the time she had her neck surgery.    AROM Assessment Site Cervical    Cervical Flexion WFL    Cervical Extension 50% limited    Cervical - Right Side Bend 25% limited    Cervical - Left Side Bend 50% limited    Cervical - Right Rotation 25% limited    Cervical - Left Rotation 25% limited      Ambulation/Gait   Ambulation/Gait Yes    Ambulation/Gait Assistance 6: Modified independent (Device/Increase time)    Ambulation Distance (Feet) 10 Feet    Gait Pattern Decreased arm swing - right;Decreased arm swing - left;Decreased hip/knee flexion  - right;Decreased hip/knee flexion - left;Decreased dorsiflexion - right;Decreased dorsiflexion - left   pt winded after ambulating 10 ft            LYMPHEDEMA/ONCOLOGY QUESTIONNAIRE - 02/26/20 0001      Lymphedema Assessments   Lymphedema Assessments Head and Neck      Head and Neck   4 cm superior to sternal notch around neck 35.5 cm    6 cm superior to sternal notch around neck 35 cm    8 cm superior to sternal notch around neck 36 cm                   Objective measurements completed on examination: See above findings.               PT Education - 02/26/20 0835    Education Details Neck ROM, importance of posture when sitting, standing and lying down, deep breathing, walking program and importance of staying active throughout treatment, CURE article on staying active, "Why exercise?" flyer, lymphedema and PT info    Person(s) Educated Patient    Methods Explanation;Handout    Comprehension Verbalized understanding               PT Long Term Goals - 02/26/20 0837      PT LONG TERM GOAL #1   Title Pt will return to baseline cervical ROM measurements and not demonstrate any signs or symptoms of lymphedema to allow pt to return to PLOF.    Time 6    Period Weeks    Status New    Target Date 04/08/20      PT LONG TERM GOAL #2   Title Pt will be able to complete 10 sit to stands in 30 seconds with use of UEs to decrease fall risk.    Baseline 6 with UEs    Time 6    Period Weeks    Status New    Target Date 04/08/20      PT LONG TERM GOAL #3   Title Pt will be able to ambulate 50 feet without complaints of shortness and breath and increased exertion to allow pt to return to PLOF.    Baseline after 10 ft pt is short of breath    Time 6    Period Weeks    Status New    Target Date 04/08/20  PT LONG TERM GOAL #4   Title Pt will be independent in a home exercise program for continued strengthening and stretching.    Time 6    Period  Weeks    Status New    Target Date 04/08/20              Head and Neck Clinic Goals - 02/26/20 0836      Patient will be able to verbalize understanding of a home exercise program for cervical range of motion, posture, and walking.    Time 1    Period Days    Status Achieved      Patient will be able to verbalize understanding of proper sitting and standing posture.    Time 1    Period Days    Status Achieved      Patient will be able to verbalize understanding of lymphedema risk and availability of treatment for this condition.    Time 1    Period Days    Status Achieved              Plan - 02/26/20 0837    Clinical Impression Statement Pt presents to head and neck clinic after undergoing a R modified radical neck dissection with sacrifice of SCM and spinal accessory nerve for treatment of maxillary cancer. She is currently undergoing radiation. She has a previous history of left infrastruction maxillectomy/palatectomy and L selective neck dissection with left temporalis myelogenous flap for palate reconstruction. She has increased fatigue and was short of breath after ambulating 10 feet today. She was only able to complete 6 sit to stands with use of UEs and 13 is average for her age. She is extrememly limited with bilateral shoulder ROM with left being slightly better than right. She has almost no right shoulder flexion since her surgery per pt. Pt would benefit from skilled PT services to improve bilateral shoulder ROM to allow pt to have more function and be able to complete ADLs and to decrease fatigue levels and improve gait. Currently her gait is characterized by decreased arm swing, hip/knee flexion and trunk rotation.    Personal Factors and Comorbidities Transportation;Comorbidity 1    Comorbidities previous left selective neck dissection    Examination-Activity Limitations Dressing;Reach Overhead;Bathing;Lift    Examination-Participation  Restrictions Cleaning;Dorita Sciara    Stability/Clinical Decision Making Evolving/Moderate complexity    Clinical Decision Making Moderate    Rehab Potential Good    PT Frequency 2x / week   eval and 1 f/u   PT Duration 6 weeks    PT Treatment/Interventions ADLs/Self Care Home Management;Patient/family education;Therapeutic exercise    PT Next Visit Plan measure bilateral shoulder ROM and add appropriate goals, begin ROM exercises for bilateral shoulder ROM, have pt schedule 2x/wk for 4 weeks- needs transportation    PT Home Exercise Plan head and neck ROM exercises    Consulted and Agree with Plan of Care Patient           Patient will benefit from skilled therapeutic intervention in order to improve the following deficits and impairments:  Difficulty walking,Decreased endurance,Decreased range of motion,Decreased knowledge of precautions,Decreased scar mobility,Impaired UE functional use,Postural dysfunction,Pain,Increased fascial restricitons  Visit Diagnosis: Stiffness of right shoulder, not elsewhere classified  Stiffness of left shoulder, not elsewhere classified  Abnormal posture  Muscle weakness (generalized)  Malignant neoplasm of maxillary sinus ([place])     Problem List Patient Active Problem List   Diagnosis Date Noted  . 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  Tremor 06/02/2019  . HTN (hypertension) 06/02/2019  . HLD (hyperlipidemia) 06/02/2019  . History of lung cancer 06/02/2019  . Chronic hyponatremia 06/02/2019  . GERD (gastroesophageal reflux disease) 06/02/2019  . Allergic rhinitis 06/02/2019    [facility] 02/26/2020, 10:14 AM  [facility] [place], [place], [address] Phone: [phone]   Fax:  [phone]  Name: Amanda Rivers MRN: [id number] Date of Birth: 1945-10-28  Manus Gunning, PT 02/26/20 10:14 AM

## 2020-02-27 ENCOUNTER — Other Ambulatory Visit: Payer: Self-pay

## 2020-02-27 ENCOUNTER — Ambulatory Visit
Admission: RE | Admit: 2020-02-27 | Discharge: 2020-02-27 | Disposition: A | Discharge status: Home / Self Care | Payer: Medicare HMO | Source: Ambulatory Visit | Attending: Radiation Oncology | Admitting: Radiation Oncology

## 2020-02-27 DIAGNOSIS — C310 Malignant neoplasm of maxillary sinus: Secondary | ICD-10-CM | POA: Diagnosis not present

## 2020-03-01 ENCOUNTER — Other Ambulatory Visit: Payer: Self-pay

## 2020-03-01 ENCOUNTER — Ambulatory Visit
Admission: RE | Admit: 2020-03-01 | Discharge: 2020-03-01 | Disposition: A | Discharge status: Home / Self Care | Payer: Medicare HMO | Source: Ambulatory Visit | Attending: Radiation Oncology | Admitting: Radiation Oncology

## 2020-03-01 DIAGNOSIS — C310 Malignant neoplasm of maxillary sinus: Secondary | ICD-10-CM | POA: Diagnosis not present

## 2020-03-02 ENCOUNTER — Ambulatory Visit (INDEPENDENT_AMBULATORY_CARE_PROVIDER_SITE_OTHER): Payer: Medicare HMO | Admitting: Pulmonary Disease

## 2020-03-02 ENCOUNTER — Encounter: Payer: Self-pay | Admitting: Pulmonary Disease

## 2020-03-02 ENCOUNTER — Other Ambulatory Visit: Payer: Self-pay

## 2020-03-02 ENCOUNTER — Ambulatory Visit: Payer: Medicare HMO | Admitting: Physical Therapy

## 2020-03-02 ENCOUNTER — Ambulatory Visit
Admission: RE | Admit: 2020-03-02 | Discharge: 2020-03-02 | Disposition: A | Discharge status: Home / Self Care | Payer: Medicare HMO | Source: Ambulatory Visit | Attending: Radiation Oncology | Admitting: Radiation Oncology

## 2020-03-02 VITALS — BP 100/60 | HR 80 | Temp 97.0°F | Ht 71.0 in | Wt 144.0 lb

## 2020-03-02 DIAGNOSIS — C760 Malignant neoplasm of head, face and neck: Secondary | ICD-10-CM | POA: Insufficient documentation

## 2020-03-02 DIAGNOSIS — R918 Other nonspecific abnormal finding of lung field: Secondary | ICD-10-CM

## 2020-03-02 DIAGNOSIS — C310 Malignant neoplasm of maxillary sinus: Secondary | ICD-10-CM

## 2020-03-02 DIAGNOSIS — R942 Abnormal results of pulmonary function studies: Secondary | ICD-10-CM

## 2020-03-02 NOTE — Progress Notes (Signed)
Synopsis: Referred in January 2022 for abnormal PET scan by No ref. provider found  Subjective:   PATIENT ID: Amanda Rivers GENDER: female DOB: 1946/01/04, MRN: [id number]  Chief Complaint  Patient presents with  . Follow-up    After PET scan to discuss biopsy    This is a 75 year old female, past medical history of hypertension head and neck cancer.  Patient is followed by Dr. Isidore Moos and Dr. Chryl Heck who last office visit was 02/17/2020.  This was reviewed in the office today.  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  Pathology revealed 2 out of 23 lymph nodes positive for squamous cell carcinoma.  Current planned appointments for biopsy or potentially disrupted due to some scheduling issues and patient desire.  Now scheduled to see me today to discuss biopsy for determination of metastatic disease to the lung.    Surgical hx: Dr. Owens Shark, Patient has also had a left infrastructure maxillectomy palatectomy, left selective neck dissection left temporalis myelogenous flap for palate reconstruction on 06/05/2019 for a tumor size of 4.2 cm grade 2 moderately differentiated squamous cell.  Ultimately in September 2021 had right sided nodal mass confirming squamous cell.  She met discussed on 02/17/2020 with her medical oncologist for potential biopsy of the lung to confirm metastatic disease to the chest.  They discussed this in this office visit.  OV 03/02/2020: Here today to discuss bronchoscopy and tissue sampling of the abnormalities found on pet imaging.  Patient's pet imaging was completed on 02/04/2020: This revealed several hypermetabolic pulmonary lesions concerning for metastatic disease.  She has a 12.5 x 10 mm left apical nodule SUV max 3.49, 2 ill-defined subsolid nodules within the left upper lobe both with SUV greater than 4 and a 3.5 x 2.5 lesion within  the left upper lobe SUV max 7.1 which was the largest of these there is no significant hypermetabolic mediastinal or hilar adenopathy.   Past Medical History:  Diagnosis Date  . Hypertension   . Malignant neoplasm of bronchus and lung, unspecified site   . Reflux      Family History  Problem Relation Age of Onset  . Brain cancer Paternal Uncle   . Brain cancer Paternal Uncle      Past Surgical History:  Procedure Laterality Date  . R Vats R upper lobectomy with node dissection  06/21/06    Social History   Socioeconomic History  . Marital status: Divorced    Spouse name: Not on file  . Number of children: Not on file  . Years of education: Not on file  . Highest education level: Not on file  Occupational History  . Not on file  Tobacco Use  . Smoking status: Former Smoker    Quit date: 09/25/2006    Years since quitting: 13.4  . Smokeless tobacco: Never Used  Vaping Use  . Vaping Use: Never used  Substance and Sexual Activity  . Alcohol use: Not Currently    Comment: occasional beer  . Drug use: Never  . Sexual activity: Not Currently  Other Topics Concern  . Not on file  Social History Narrative  . Not on file   Social Determinants of Health   Financial Resource Strain: Not on file  Food Insecurity: No Food Insecurity  . Worried About Charity fundraiser in the Last Year: Never true  . Ran Out of Food in the Last  Year: Never true  Transportation Needs: No Transportation Needs  . Lack of Transportation (Medical): No  . Lack of Transportation (Non-Medical): No  Physical Activity: Not on file  Stress: Not on file  Social Connections: Not on file  Intimate Partner Violence: Not on file     No Known Allergies   Outpatient Medications Prior to Visit  Medication Sig Dispense Refill  . amLODipine-olmesartan (AZOR) 10-40 MG tablet Take 1 tablet by mouth daily.    Marland Kitchen dexlansoprazole (DEXILANT) 60 MG capsule Take 60 mg by mouth daily.    . nebivolol (BYSTOLIC)  10 MG tablet Take 10 mg by mouth daily.    . primidone (MYSOLINE) 50 MG tablet Take 50 mg by mouth 3 (three) times daily.    . rosuvastatin (CRESTOR) 20 MG tablet Take 20 mg by mouth daily.    Marland Kitchen spironolactone (ALDACTONE) 25 MG tablet Take 25 mg by mouth 2 (two) times daily.    . celecoxib (CELEBREX) 200 MG capsule Take 200 mg by mouth daily. (Patient not taking: No sig reported)    . montelukast (SINGULAIR) 10 MG tablet Take 10 mg by mouth daily as needed. (Patient not taking: No sig reported)    . pantoprazole (PROTONIX) 40 MG tablet Take 40 mg by mouth daily. (Patient not taking: No sig reported)     No facility-administered medications prior to visit.    Review of Systems  Constitutional: Positive for malaise/fatigue. Negative for chills, fever and weight loss.  HENT: Negative for hearing loss, sore throat and tinnitus.   Eyes: Negative for blurred vision and double vision.  Respiratory: Positive for cough. Negative for hemoptysis, sputum production, shortness of breath, wheezing and stridor.   Cardiovascular: Negative for chest pain, palpitations, orthopnea, leg swelling and PND.  Gastrointestinal: Negative for abdominal pain, constipation, diarrhea, heartburn, nausea and vomiting.  Genitourinary: Negative for dysuria, hematuria and urgency.  Musculoskeletal: Negative for joint pain and myalgias.  Skin: Negative for itching and rash.  Neurological: Negative for dizziness, tingling, weakness and headaches.  Endo/Heme/Allergies: Negative for environmental allergies. Does not bruise/bleed easily.  Psychiatric/Behavioral: Negative for depression. The patient is not nervous/anxious and does not have insomnia.   All other systems reviewed and are negative.    Objective:  Physical Exam Vitals reviewed.  Constitutional:      General: She is not in acute distress.    Appearance: She is well-developed.     Comments: Elderly frail-appearing  HENT:     Head: Normocephalic and atraumatic.      Mouth/Throat:     Mouth: Oropharynx is clear and moist.     Pharynx: No oropharyngeal exudate.  Eyes:     Extraocular Movements: EOM normal.     Conjunctiva/sclera: Conjunctivae normal.     Pupils: Pupils are equal, round, and reactive to light.  Neck:     Vascular: No JVD.     Trachea: No tracheal deviation.     Comments: Loss of supraclavicular fat Cardiovascular:     Rate and Rhythm: Normal rate and regular rhythm.     Pulses: Intact distal pulses.     Heart sounds: S1 normal and S2 normal.     Comments: Distant heart tones Pulmonary:     Effort: No tachypnea or accessory muscle usage.     Breath sounds: No stridor. Decreased breath sounds (throughout all lung fields) present. No wheezing, rhonchi or rales.     Comments: Increased AP chest diameter Abdominal:     General: Bowel sounds are normal.  There is no distension.     Palpations: Abdomen is soft.     Tenderness: There is no abdominal tenderness.  Musculoskeletal:        General: Deformity (muscle wasting ) present. No edema.  Skin:    General: Skin is warm and dry.     Capillary Refill: Capillary refill takes less than 2 seconds.     Findings: No rash.  Neurological:     Mental Status: She is alert and oriented to person, place, and time.  Psychiatric:        Mood and Affect: Mood and affect normal.        Behavior: Behavior normal.      Vitals:   03/02/20 0934  BP: 100/60  Pulse: 80  Temp: (!) 97 F (36.1 C)  TempSrc: Tympanic  SpO2: 97%  Weight: 144 lb (65.3 kg)  Height: 5' 11"  (1.803 m)   97% on RA BMI Readings from Last 3 Encounters:  03/02/20 20.08 kg/m  02/17/20 20.37 kg/m  05/07/19 22.16 kg/m   Wt Readings from Last 3 Encounters:  03/02/20 144 lb (65.3 kg)  02/17/20 147 lb 1.6 oz (66.7 kg)  05/07/19 160 lb (72.6 kg)     CBC    Component Value Date/Time   WBC 5.1 05/07/2019 2333   RBC 5.05 05/07/2019 2333   HGB 14.3 05/08/2019 0006   HCT 42.0 05/08/2019 0006   PLT 336  05/07/2019 2333   MCV 78.6 (L) 05/07/2019 2333   MCH 24.8 (L) 05/07/2019 2333   MCHC 31.5 05/07/2019 2333   RDW 17.9 (H) 05/07/2019 2333   LYMPHSABS 1.4 05/07/2019 2333   MONOABS 0.4 05/07/2019 2333   EOSABS 0.2 05/07/2019 2333   BASOSABS 0.0 05/07/2019 2333     Chest Imaging: Nuclear medicine PET imaging 02/04/2020: Multiple PET avid lesions within the left upper lobe concerning for metastatic disease. The patient's images have been independently reviewed by me.    Pulmonary Functions Testing Results: No flowsheet data found.  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 Squamous cell carcinoma of head and neck (HCC)  C76.0 Ambulatory referral to Pulmonology    CT Super D Chest Wo Contrast    Procedural/ Surgical Case Request: VIDEO BRONCHOSCOPY WITH ENDOBRONCHIAL NAVIGATION, VIDEO BRONCHOSCOPY WITH ENDOBRONCHIAL ULTRASOUND  3. Abnormal PET scan of lung  R94.2   4. Multiple lung nodules  R91.8 Ambulatory referral to Pulmonology    CT Super D Chest Wo Contrast    Discussion: This is a 75 year old female, history of advanced age squamous cell carcinoma of the head and neck.  Has underwent multiple bouts of radiation treatments and surgeries for this.  Had restaging PET scan with abnormal left upper lobe nodule.  Initially in 2008 has a history of stage I lung cancer with resection by Dr. Arlyce Dice.  Today in the office we discussed the abnormalities in the PET scan and the request from oncology for consideration for biopsy.  Plan: Today in office we discussed the risk benefits and alternatives of proceeding with navigational bronchoscopy and tissue sampling of the abnormal PET avid lesions within the chest. Patient is agreeable to proceed with tissue biopsy. We discussed risk of bleeding and pneumothorax. Tentative scheduled date for procedure be 03/12/2020. She will need a  super D CT of the chest prior to this. We will work  with transportation hopefully get this done on one of the days she is has radiation completed next week at [facility] Once CT scan has been completed we will need to get a disc over to [facility] endoscopy  Procedure planned: Navigational bronchoscopy, possible endobronchial ultrasound. Date: 03/12/2020 Location: [facility] or [facility] operating room depending upon availability.  Preferred location [facility]   Current Outpatient Medications:  .  amLODipine-olmesartan (AZOR) 10-40 MG tablet, Take 1 tablet by mouth daily., Disp: , Rfl:  .  dexlansoprazole (DEXILANT) 60 MG capsule, Take 60 mg by mouth daily., Disp: , Rfl:  .  nebivolol (BYSTOLIC) 10 MG tablet, Take 10 mg by mouth daily., Disp: , Rfl:  .  primidone (MYSOLINE) 50 MG tablet, Take 50 mg by mouth 3 (three) times daily., Disp: , Rfl:  .  rosuvastatin (CRESTOR) 20 MG tablet, Take 20 mg by mouth daily., Disp: , Rfl:  .  spironolactone (ALDACTONE) 25 MG tablet, Take 25 mg by mouth 2 (two) times daily., Disp: , Rfl:   I spent 62 minutes dedicated to the care of this patient on the date of this encounter to include pre-visit review of records, face-to-face time with the patient discussing conditions above, post visit ordering of testing, clinical documentation with the electronic health record, making appropriate referrals as documented, and communicating necessary findings to members of the patients care team.   Garner Nash, DO [facility] 03/02/2020 10:49 AM

## 2020-03-02 NOTE — Patient Instructions (Signed)
Thank you for visiting Dr. Valeta Harms at [facility]. Today we recommend the following:  Orders Placed This Encounter  Procedures  . CT Super D Chest Wo Contrast  . Ambulatory referral to Pulmonology   Bronchoscopy date: 03/12/2020, Friday  Nothing to eat the night before the procedure past midnight Except phone calls from scheduling, radiology for CT, and pre-op services   Return in about 6 weeks (around 04/13/2020) for with APP or Dr. Valeta Harms.    Please do your part to reduce the spread of COVID-19.

## 2020-03-03 ENCOUNTER — Telehealth: Payer: Self-pay | Admitting: General Practice

## 2020-03-03 ENCOUNTER — Ambulatory Visit: Payer: Medicare HMO | Admitting: Nutrition

## 2020-03-03 ENCOUNTER — Ambulatory Visit
Admission: RE | Admit: 2020-03-03 | Discharge: 2020-03-03 | Disposition: A | Discharge status: Home / Self Care | Payer: Medicare HMO | Source: Ambulatory Visit | Attending: Radiation Oncology | Admitting: Radiation Oncology

## 2020-03-03 ENCOUNTER — Other Ambulatory Visit: Payer: Self-pay

## 2020-03-03 DIAGNOSIS — C310 Malignant neoplasm of maxillary sinus: Secondary | ICD-10-CM | POA: Diagnosis not present

## 2020-03-03 NOTE — Progress Notes (Signed)
75 year old female diagnosed with recurrent maxillary cancer. She is followed by Dr. Chryl Heck and Dr. Isidore Moos.  Past medical history includes hypertension and reflux.  Medications include Protonix and Crestor.  Labs were reviewed.  Height: 5 feet 11 inches. Weight: 144 pounds on January 11. Usual body weight: 160 pounds per patient BMI: 20.08.  Estimated nutrition needs: 2100-2300 cal, 85-100 g protein, 2.2 L fluid.  Patient presents to nutrition consult alone. She reports she is very fatigued and hopes I can give her something for increased energy. Reports increased thickened saliva. Also has taste alterations. States she drinks 5 cartons of Ensure Plus a day.  (1775 kcal) She began increasing oral supplements after surgery.  Nutrition diagnosis: Unintended weight loss related to recurrent cancer and associated treatments as evidenced by 10% weight loss from usual body weight.  Intervention: Educated patient on the importance of continuing smaller more frequent meals and snacks utilizing high-calorie, high-protein foods. Continue drinking Ensure Plus or equivalent 5 cartons daily. Provided coupons.  Offered complementary case however patient did not want to take with her today.  She may ask her granddaughter to stop by and pick this up. Educated on strategies for improving thickened saliva.  Strongly encouraged baking soda and salt water rinses before meals and throughout the day.  Recipe provided. Questions were answered.  Teach back method used.  Fact sheet given.  Monitoring, evaluation, goals: Patient will tolerate increased calories and protein to minimize weight loss and improve quality of life.  Next visit: Wednesday, January 19, after radiation therapy.  **Disclaimer: This note was dictated with voice recognition software. Similar sounding words can inadvertently be transcribed and this note may contain transcription errors which may not have been corrected upon publication of  note.**

## 2020-03-03 NOTE — Progress Notes (Signed)
Oncology Nurse Navigator Documentation  I have faxed the completed form required for [name] to apply for [place] with Medicaid to the number provided on the form. I received notice of successful fax transmission. Edwyna Shell SW is also aware that the form has been sent.   Harlow Asa RN, BSN, OCN Head & Neck Oncology Nurse [place] at [facility] Phone # [phone]  Fax # [phone]

## 2020-03-03 NOTE — Telephone Encounter (Signed)
[place] CSW Progress Notes  Call to daughter Malyssa Maris - she has copy of patient's [place] card and will let us know more details about her coverage. Daughter will call CSW w this information, this will help in making a referral for [place] if patient is agreeable.  Edwyna Shell, LCSW Clinical Social Worker Phone:  [phone]

## 2020-03-04 ENCOUNTER — Inpatient Hospital Stay: Payer: Medicare HMO | Admitting: General Practice

## 2020-03-04 ENCOUNTER — Ambulatory Visit
Admission: RE | Admit: 2020-03-04 | Discharge: 2020-03-04 | Disposition: A | Discharge status: Home / Self Care | Payer: Medicare HMO | Source: Ambulatory Visit | Attending: Radiation Oncology | Admitting: Radiation Oncology

## 2020-03-04 DIAGNOSIS — C760 Malignant neoplasm of head, face and neck: Secondary | ICD-10-CM

## 2020-03-04 DIAGNOSIS — C310 Malignant neoplasm of maxillary sinus: Secondary | ICD-10-CM | POA: Diagnosis not present

## 2020-03-04 NOTE — Progress Notes (Signed)
[place] CSW Progress Notes  Called patient to discuss referral for [facility].  Patient states "I do need some help, how long would they stay?"  CSW explained the process for referral and determination of hours - this is made by [employer] of .  If approved for services after initial screen, patient and family will need to identify a service provider and make arrangements directly with the agency.  CSW also contacted [employer] to verify that application has been received - appears that her application has been received but is under review.  CSW advised to call back tomorrow to continue to track referral.  Edwyna Shell, [place] Worker Phone:  [phone]

## 2020-03-05 ENCOUNTER — Encounter: Payer: Self-pay | Admitting: Radiation Oncology

## 2020-03-05 ENCOUNTER — Other Ambulatory Visit: Payer: Self-pay

## 2020-03-05 ENCOUNTER — Ambulatory Visit
Admission: RE | Admit: 2020-03-05 | Discharge: 2020-03-05 | Disposition: A | Discharge status: Home / Self Care | Payer: Medicare HMO | Source: Ambulatory Visit | Attending: Radiation Oncology | Admitting: Radiation Oncology

## 2020-03-05 DIAGNOSIS — C310 Malignant neoplasm of maxillary sinus: Secondary | ICD-10-CM | POA: Diagnosis not present

## 2020-03-08 ENCOUNTER — Encounter: Payer: Self-pay | Admitting: *Deleted

## 2020-03-08 ENCOUNTER — Ambulatory Visit: Payer: Medicare HMO

## 2020-03-08 ENCOUNTER — Telehealth: Payer: Self-pay | Admitting: Hematology and Oncology

## 2020-03-08 ENCOUNTER — Other Ambulatory Visit: Payer: Self-pay

## 2020-03-08 DIAGNOSIS — C310 Malignant neoplasm of maxillary sinus: Secondary | ICD-10-CM

## 2020-03-08 NOTE — Progress Notes (Signed)
[place] Work  Clinical Social Work received request from patients daughter for update on PCS and home care.  CSW contacted patients daughter to offer support.  CSW informed patients daughter that [employer] [place] was closed today, and CSW could follow up on status of patients PCS application tomorrow.  CSW provided update received on 1/61/09 that application had been accepted and was under review.  CSW and patients daughter discussed the process for determination of hours by [employer].  Patients daughter was appreciative of CSW contact and update.  CSW will follow up with patient and family once additional information is received.       Johnnye Lana, MSW, LCSW, OSW-C Clinical Social Worker [facility] [phone]

## 2020-03-08 NOTE — Telephone Encounter (Signed)
Rescheduled appointment due to weather. Called patient, no answer. Left message with updated appointment time.

## 2020-03-09 ENCOUNTER — Inpatient Hospital Stay: Payer: Medicare HMO | Admitting: Hematology and Oncology

## 2020-03-09 ENCOUNTER — Ambulatory Visit: Payer: Medicare HMO

## 2020-03-09 ENCOUNTER — Other Ambulatory Visit (HOSPITAL_COMMUNITY): Payer: Medicare HMO

## 2020-03-09 NOTE — Progress Notes (Signed)
I spoke to Ms Dobson's daughter, [name] to check on Covid test, patient missed appointment today.  [name] said that Ms Chriss Czar missed appointment today because she fell on Sunday, 03/07/20 and her hip is hurting.  [name] said that Ms Chriss Czar had been trying to move the day of the procedure , because no one can pick the patient up that day, 03/12/20.  [name] said that she spoke to [facility] the oncology navigator and they are working on getting some help for her mother, in her home.  I told [name] that is not normal for a caregiver in the home 24 hours a day that quickly, but I'll wait to see if it arranged on Thursday.

## 2020-03-10 ENCOUNTER — Ambulatory Visit: Payer: Medicare HMO

## 2020-03-10 ENCOUNTER — Telehealth: Payer: Self-pay | Admitting: Hematology and Oncology

## 2020-03-10 ENCOUNTER — Encounter (HOSPITAL_COMMUNITY): Payer: Self-pay | Admitting: Pulmonary Disease

## 2020-03-10 ENCOUNTER — Telehealth: Payer: Self-pay | Admitting: Pulmonary Disease

## 2020-03-10 ENCOUNTER — Encounter: Payer: Self-pay | Admitting: *Deleted

## 2020-03-10 ENCOUNTER — Inpatient Hospital Stay: Payer: Medicare HMO | Admitting: Nutrition

## 2020-03-10 NOTE — Telephone Encounter (Signed)
Spoke with Anderson Malta at [facility] bronch scheduled for 03/12/20 will need to be rescheduled due to pt not having transportation  There has to be a responsible party to take her, and [facility] can not be used for this  She also mentioned Chest CT that is scheduled for 03/11/20- unsure if pt needs to keep this appt or not  She states that if the pt does need CT she will be happy to call transportation to get her there for this  Please advise on any dates you want [place] to try for to get her bronch rescheduled and if she needs CT tomorrow  Thanks

## 2020-03-10 NOTE — Telephone Encounter (Signed)
LMTCB for [employer]

## 2020-03-10 NOTE — Telephone Encounter (Signed)
Rescheduled appointment per 1/18 iinbasket msg from MD. Called patient, no answer. Left message with appointment date and time.

## 2020-03-10 NOTE — Telephone Encounter (Signed)
Called and spoke with Arbie Cookey. She stated that the granddaughter lives out of state. Per the granddaughter, the patient had a fall yesterday and does not want to proceed with the procedure.   Called the granddaughter but she did not answer.   Called and spoke with Anderson Malta. She stated that she will continue to get in touch with the patient for the CT scan. Patient has continue to miss her radiation appointments. She will let us know if and when she had made contact with patient. Advised her when she calls, to ask to speak with someone in triage.   Will encounter open for follow up.

## 2020-03-10 NOTE — Progress Notes (Signed)
[place] Work  Clinical Social Work confirmed with [employer] [place] that patients assessment is scheduled for January 20th at 8:30.  CSW confirmed that patients daughter will be present for patients assessment.  CSW spoke with patients daughter [name] to share information provided by [employer].    Johnnye Lana, MSW, LCSW, OSW-C Clinical Social Worker [facility] [phone]

## 2020-03-10 NOTE — Telephone Encounter (Signed)
Since she refused to proceed with bronch and biopsy per note , We may not be able to talk to her about role of systemic chemotherapy. I think discussion about goals of care is appropriate if she doesn't wish to proceed with any further treatment. Sarah, please let me know what you think.

## 2020-03-10 NOTE — Telephone Encounter (Signed)
LMTCB for [employer].

## 2020-03-10 NOTE — Telephone Encounter (Signed)
PCCM:  There has been many scheduling issues with this patient. It does not seem she is actually interested in having this procedure done. The importance of having a person to take her to and from the case was stressed to the patient during her last office visit and she stated that she would arrange for her granddaughter to bring her.   No, I will not be rescheduling her case until she is serious about having the case completed. She can schedule a follow up appointment with me to discuss further.   Her Super D CT will need to be completed tomorrow if she plans on keeping her case appointment on Friday. Delaying the case will continue to delay her treatments by oncology.   CC:. Drs Chryl Heck and Oleh Genin, DO [facility] 03/10/2020 2:36 PM

## 2020-03-11 ENCOUNTER — Ambulatory Visit: Payer: Medicare HMO

## 2020-03-11 ENCOUNTER — Telehealth: Payer: Self-pay

## 2020-03-11 ENCOUNTER — Ambulatory Visit (HOSPITAL_COMMUNITY): Payer: Medicare HMO

## 2020-03-11 ENCOUNTER — Encounter: Payer: Self-pay | Admitting: Nutrition

## 2020-03-11 NOTE — Progress Notes (Signed)
Patient did not show up for nutrition appointment. 

## 2020-03-11 NOTE — Telephone Encounter (Signed)
Notified by therapists in L2 that patient had not showed up for her 13:00 XRT appointment. Called and spoke with patient's daughter Amanda Rivers who stated that patient still did not fell strong enough since her fall earlier this week, or interested in leaving the house due to the inclement weather. Amanda Rivers stated that patient did not plan to come in for treatment tomorrow, and that they would reassess on Monday 03/15/20. Before hanging up [name] also mentioned that an agency [employer]) did an assessment today and approved for patient to have a nurse/aide with her every day, so they are now just waiting on the list of possible agencies to choose from. Informed [name] I would update Dr. Isidore Moos and radiation therapists, and we would plan to check on patient Monday in case patient wanted to reassess interest in pursuing radiation further. Daughter verbalized understanding and agreement, and denied any other needs at this time.

## 2020-03-11 NOTE — Telephone Encounter (Signed)
Ok thanks. Just let me know when you want me to go after it. I will see her in the office and hopefully request family member presence to ensure transportation needs are met.   Garner Nash, DO [facility] [facility] 03/11/2020 12:39 PM

## 2020-03-12 ENCOUNTER — Ambulatory Visit (HOSPITAL_COMMUNITY): Admission: RE | Admit: 2020-03-12 | Payer: Medicare HMO | Source: Home / Self Care | Admitting: Pulmonary Disease

## 2020-03-12 ENCOUNTER — Ambulatory Visit: Payer: Medicare HMO

## 2020-03-12 DIAGNOSIS — C760 Malignant neoplasm of head, face and neck: Secondary | ICD-10-CM

## 2020-03-12 HISTORY — DX: Hyperlipidemia, unspecified: E78.5

## 2020-03-12 SURGERY — VIDEO BRONCHOSCOPY WITH ENDOBRONCHIAL NAVIGATION
Anesthesia: General | Laterality: Bilateral

## 2020-03-15 ENCOUNTER — Ambulatory Visit: Payer: Medicare HMO

## 2020-03-15 NOTE — Progress Notes (Signed)
Oncology Nurse Navigator Documentation  Ms. Dulski did not present for her radiation treatment today. I called and spoke with her about why she wasn't able to come today and Ms. Pilling reported that her ride with transportation cancelled and then she declined for me to set her up with another ride today. I called transportation and was told that her ride did come to her house and called her to notify her that they were there and when Ms. Schecter did not come out of her house after a second phone call her ride cancelled and left. She does have transportation set up for the rest of the week and I notified Ms. Penniman that she would need to be ready by 12:20 for her ride and to always answer the phone. Ms. Delisi verbalized her understanding and told me that she would be here tomorrow.   Harlow Asa RN, BSN, OCN Head & Neck Oncology Nurse [place] at [facility] Phone # [phone]  Fax # [phone]

## 2020-03-16 ENCOUNTER — Ambulatory Visit: Payer: Medicare HMO

## 2020-03-17 ENCOUNTER — Telehealth: Payer: Self-pay

## 2020-03-17 ENCOUNTER — Encounter: Payer: Self-pay | Admitting: General Practice

## 2020-03-17 ENCOUNTER — Inpatient Hospital Stay: Payer: Medicare HMO | Admitting: Nutrition

## 2020-03-17 ENCOUNTER — Ambulatory Visit: Payer: Medicare HMO

## 2020-03-17 ENCOUNTER — Other Ambulatory Visit: Payer: Self-pay

## 2020-03-17 DIAGNOSIS — C310 Malignant neoplasm of maxillary sinus: Secondary | ICD-10-CM

## 2020-03-17 NOTE — Progress Notes (Signed)
Oncology Nurse Navigator Documentation  Mrs. Rasheed's daughter [name] called me this morning to update me on her mother's status regarding continuing radiation treatment. She came into town yesterday and met with her mother in person and learned that over the past few days she had become very weak and unable to care for herself properly with several recent falls. At this time they are cancelling all her future radiation treatments here at [facility]. They are working to assist her at home and possible have her moved to a facility. I have notified Dr. Isidore Moos and the radiation treatment team of this development. [name] knows to call me if there is anything I can do to help them.   Harlow Asa RN, BSN, OCN Head & Neck Oncology Nurse [place] at [facility] Phone # [phone]  Fax # [phone]

## 2020-03-17 NOTE — Telephone Encounter (Signed)
Spoke with patient's daughter [name] and scheduled an in-person Palliative Consult for   COVID screening was negative. No pets in home. Patient lives alone. Daughter and granddaughter lives out of the area.   Consent obtained; updated Outlook/Netsmart/Team List and Epic.  Family is aware they will be receiving a call from NP the day before or day of to confirm appointment.

## 2020-03-17 NOTE — Progress Notes (Signed)
Amanda Rivers  Call from daughter, Amanda Rivers, states that "radiation has beaten her to the ground", she has fallen twicel times over the last week, she is drinking Ensure but is not eating well.  She did eat soft food from a cafeteria last night.  Daughter is concerned about her ability to care for herself in her own home - would like to have her placed in a SNF as soon as possible.  Daughter lives out of town, she stresses that she cannot provide the level of help she feels her mother requires at home.   Patient has had assessment by [facility], assessment has been completed.  Daughter has spoken w [employer] ([address]) - she spoke w Merrily Pew.  Agency has not received paperwork from [facility] ([facility] did the assessment last week).    CSW will determine status w [facility] and let daughter know where mother stands in terms of getting in home assistance. Spoke w [facility], they have completed assessment and spoke w daughter [name] who selected [facility].  Per Janeece Riggers, they sent the referral to [facility] on 1/24 - provided NPI # where they sent referral to [facility] [phone]).  Per [facility], a clinician will need to pull the referral.  CSW called [facility] - they will not resend the referral, [facility] will need to contact [place].  CSW updated daughter via VM.  Also messaged oncologist and nurse navigator w update.   Edwyna Shell, LCSW Clinical Social Worker Phone:  [phone] .

## 2020-03-17 NOTE — Telephone Encounter (Signed)
Attempted to contact patient's daughter [name] to schedule a Palliative Care consult appointment. No answer left a message to return call.

## 2020-03-18 ENCOUNTER — Ambulatory Visit: Payer: Medicare HMO

## 2020-03-19 ENCOUNTER — Ambulatory Visit: Payer: Medicare HMO

## 2020-03-19 ENCOUNTER — Encounter: Payer: Self-pay | Admitting: Licensed Clinical Social Worker

## 2020-03-19 NOTE — Progress Notes (Signed)
[facility] CSW Progress Note  Holiday representative received VM from patient's daughter, [name], while covering for primary CSWs.  Spoke with [name] who relayed that [facility] does not have staffing and sent the referral for [facility] back to [facility]. [name] has tried Brewing technologist Garment/textile technologist- [phone]) and coordinator Jana [phone]) without success. They would like to change provider from [facility] to [facility].  CSW spoke with [facility], Jana Half, and relayed choice preference from patient/ pt's daughter. Per Jana Half, they need to speak with [facility] directly. Jana Half agreed to call pt's daughter back to complete process. LVM for [name] updating re: the above conversation.   Christeen Douglas , LCSW

## 2020-03-22 ENCOUNTER — Ambulatory Visit: Payer: Medicare HMO

## 2020-03-23 ENCOUNTER — Ambulatory Visit: Payer: Medicare HMO | Admitting: Hematology and Oncology

## 2020-03-23 ENCOUNTER — Ambulatory Visit: Payer: Medicare HMO

## 2020-03-24 ENCOUNTER — Encounter: Payer: Medicare HMO | Admitting: Nutrition

## 2020-03-24 ENCOUNTER — Ambulatory Visit: Payer: Medicare HMO

## 2020-03-25 ENCOUNTER — Telehealth: Payer: Self-pay

## 2020-03-25 ENCOUNTER — Ambulatory Visit: Payer: Medicare HMO

## 2020-03-25 ENCOUNTER — Other Ambulatory Visit: Payer: Self-pay

## 2020-03-25 ENCOUNTER — Encounter (HOSPITAL_COMMUNITY): Payer: Self-pay

## 2020-03-25 ENCOUNTER — Inpatient Hospital Stay (HOSPITAL_COMMUNITY): Payer: Medicare HMO

## 2020-03-25 ENCOUNTER — Other Ambulatory Visit: Payer: Medicaid Other | Admitting: Student

## 2020-03-25 ENCOUNTER — Emergency Department (HOSPITAL_COMMUNITY): Payer: Medicare HMO

## 2020-03-25 ENCOUNTER — Inpatient Hospital Stay (HOSPITAL_COMMUNITY)
Admission: EM | Admit: 2020-03-25 | Discharge: 2020-04-09 | DRG: 871 | Disposition: A | Discharge status: Skilled Nursing Facility | Payer: Medicare HMO | Attending: Internal Medicine | Admitting: Internal Medicine

## 2020-03-25 DIAGNOSIS — Z20822 Contact with and (suspected) exposure to covid-19: Secondary | ICD-10-CM | POA: Diagnosis not present

## 2020-03-25 DIAGNOSIS — E860 Dehydration: Secondary | ICD-10-CM | POA: Diagnosis present

## 2020-03-25 DIAGNOSIS — I10 Essential (primary) hypertension: Secondary | ICD-10-CM | POA: Diagnosis present

## 2020-03-25 DIAGNOSIS — R6520 Severe sepsis without septic shock: Secondary | ICD-10-CM

## 2020-03-25 DIAGNOSIS — I82411 Acute embolism and thrombosis of right femoral vein: Secondary | ICD-10-CM | POA: Diagnosis present

## 2020-03-25 DIAGNOSIS — C760 Malignant neoplasm of head, face and neck: Secondary | ICD-10-CM | POA: Diagnosis not present

## 2020-03-25 DIAGNOSIS — Z6820 Body mass index (BMI) 20.0-20.9, adult: Secondary | ICD-10-CM

## 2020-03-25 DIAGNOSIS — Z515 Encounter for palliative care: Secondary | ICD-10-CM | POA: Diagnosis not present

## 2020-03-25 DIAGNOSIS — R64 Cachexia: Secondary | ICD-10-CM | POA: Diagnosis present

## 2020-03-25 DIAGNOSIS — R1312 Dysphagia, oropharyngeal phase: Secondary | ICD-10-CM | POA: Diagnosis present

## 2020-03-25 DIAGNOSIS — E876 Hypokalemia: Secondary | ICD-10-CM

## 2020-03-25 DIAGNOSIS — D696 Thrombocytopenia, unspecified: Secondary | ICD-10-CM | POA: Diagnosis present

## 2020-03-25 DIAGNOSIS — E872 Acidosis, unspecified: Secondary | ICD-10-CM

## 2020-03-25 DIAGNOSIS — E46 Unspecified protein-calorie malnutrition: Secondary | ICD-10-CM | POA: Diagnosis present

## 2020-03-25 DIAGNOSIS — Z87891 Personal history of nicotine dependence: Secondary | ICD-10-CM

## 2020-03-25 DIAGNOSIS — I82402 Acute embolism and thrombosis of unspecified deep veins of left lower extremity: Secondary | ICD-10-CM | POA: Diagnosis not present

## 2020-03-25 DIAGNOSIS — R918 Other nonspecific abnormal finding of lung field: Secondary | ICD-10-CM | POA: Diagnosis not present

## 2020-03-25 DIAGNOSIS — R7303 Prediabetes: Secondary | ICD-10-CM | POA: Diagnosis not present

## 2020-03-25 DIAGNOSIS — E861 Hypovolemia: Secondary | ICD-10-CM | POA: Diagnosis present

## 2020-03-25 DIAGNOSIS — M25552 Pain in left hip: Secondary | ICD-10-CM | POA: Diagnosis not present

## 2020-03-25 DIAGNOSIS — J700 Acute pulmonary manifestations due to radiation: Secondary | ICD-10-CM | POA: Diagnosis not present

## 2020-03-25 DIAGNOSIS — J90 Pleural effusion, not elsewhere classified: Secondary | ICD-10-CM | POA: Diagnosis not present

## 2020-03-25 DIAGNOSIS — Y842 Radiological procedure and radiotherapy as the cause of abnormal reaction of the patient, or of later complication, without mention of misadventure at the time of the procedure: Secondary | ICD-10-CM | POA: Diagnosis present

## 2020-03-25 DIAGNOSIS — R52 Pain, unspecified: Secondary | ICD-10-CM | POA: Diagnosis not present

## 2020-03-25 DIAGNOSIS — E785 Hyperlipidemia, unspecified: Secondary | ICD-10-CM | POA: Diagnosis present

## 2020-03-25 DIAGNOSIS — I82432 Acute embolism and thrombosis of left popliteal vein: Secondary | ICD-10-CM | POA: Diagnosis not present

## 2020-03-25 DIAGNOSIS — R531 Weakness: Secondary | ICD-10-CM

## 2020-03-25 DIAGNOSIS — N281 Cyst of kidney, acquired: Secondary | ICD-10-CM | POA: Diagnosis not present

## 2020-03-25 DIAGNOSIS — D638 Anemia in other chronic diseases classified elsewhere: Secondary | ICD-10-CM | POA: Diagnosis present

## 2020-03-25 DIAGNOSIS — J9601 Acute respiratory failure with hypoxia: Secondary | ICD-10-CM | POA: Diagnosis present

## 2020-03-25 DIAGNOSIS — Z7189 Other specified counseling: Secondary | ICD-10-CM | POA: Diagnosis not present

## 2020-03-25 DIAGNOSIS — J189 Pneumonia, unspecified organism: Secondary | ICD-10-CM | POA: Diagnosis not present

## 2020-03-25 DIAGNOSIS — D72829 Elevated white blood cell count, unspecified: Secondary | ICD-10-CM | POA: Diagnosis not present

## 2020-03-25 DIAGNOSIS — R000 Tachycardia, unspecified: Secondary | ICD-10-CM | POA: Diagnosis not present

## 2020-03-25 DIAGNOSIS — R0602 Shortness of breath: Secondary | ICD-10-CM

## 2020-03-25 DIAGNOSIS — R7989 Other specified abnormal findings of blood chemistry: Secondary | ICD-10-CM

## 2020-03-25 DIAGNOSIS — R32 Unspecified urinary incontinence: Secondary | ICD-10-CM | POA: Diagnosis present

## 2020-03-25 DIAGNOSIS — R627 Adult failure to thrive: Secondary | ICD-10-CM | POA: Diagnosis present

## 2020-03-25 DIAGNOSIS — R5381 Other malaise: Secondary | ICD-10-CM | POA: Diagnosis not present

## 2020-03-25 DIAGNOSIS — K828 Other specified diseases of gallbladder: Secondary | ICD-10-CM | POA: Diagnosis present

## 2020-03-25 DIAGNOSIS — T380X5A Adverse effect of glucocorticoids and synthetic analogues, initial encounter: Secondary | ICD-10-CM | POA: Diagnosis not present

## 2020-03-25 DIAGNOSIS — G9341 Metabolic encephalopathy: Secondary | ICD-10-CM | POA: Diagnosis not present

## 2020-03-25 DIAGNOSIS — R2681 Unsteadiness on feet: Secondary | ICD-10-CM | POA: Diagnosis not present

## 2020-03-25 DIAGNOSIS — E871 Hypo-osmolality and hyponatremia: Secondary | ICD-10-CM

## 2020-03-25 DIAGNOSIS — Z79899 Other long term (current) drug therapy: Secondary | ICD-10-CM

## 2020-03-25 DIAGNOSIS — C069 Malignant neoplasm of mouth, unspecified: Secondary | ICD-10-CM | POA: Diagnosis present

## 2020-03-25 DIAGNOSIS — M25551 Pain in right hip: Secondary | ICD-10-CM | POA: Diagnosis not present

## 2020-03-25 DIAGNOSIS — R54 Age-related physical debility: Secondary | ICD-10-CM | POA: Diagnosis present

## 2020-03-25 DIAGNOSIS — Z923 Personal history of irradiation: Secondary | ICD-10-CM | POA: Diagnosis not present

## 2020-03-25 DIAGNOSIS — I959 Hypotension, unspecified: Secondary | ICD-10-CM

## 2020-03-25 DIAGNOSIS — F410 Panic disorder [episodic paroxysmal anxiety] without agoraphobia: Secondary | ICD-10-CM | POA: Diagnosis present

## 2020-03-25 DIAGNOSIS — N179 Acute kidney failure, unspecified: Secondary | ICD-10-CM

## 2020-03-25 DIAGNOSIS — R739 Hyperglycemia, unspecified: Secondary | ICD-10-CM | POA: Diagnosis not present

## 2020-03-25 DIAGNOSIS — Z66 Do not resuscitate: Secondary | ICD-10-CM | POA: Diagnosis present

## 2020-03-25 DIAGNOSIS — A419 Sepsis, unspecified organism: Principal | ICD-10-CM | POA: Diagnosis present

## 2020-03-25 DIAGNOSIS — Z808 Family history of malignant neoplasm of other organs or systems: Secondary | ICD-10-CM

## 2020-03-25 DIAGNOSIS — R159 Full incontinence of feces: Secondary | ICD-10-CM | POA: Diagnosis present

## 2020-03-25 DIAGNOSIS — J439 Emphysema, unspecified: Secondary | ICD-10-CM | POA: Diagnosis not present

## 2020-03-25 DIAGNOSIS — M6281 Muscle weakness (generalized): Secondary | ICD-10-CM | POA: Diagnosis not present

## 2020-03-25 DIAGNOSIS — Y92239 Unspecified place in hospital as the place of occurrence of the external cause: Secondary | ICD-10-CM | POA: Diagnosis not present

## 2020-03-25 DIAGNOSIS — C0680 Malignant neoplasm of overlapping sites of unspecified parts of mouth: Secondary | ICD-10-CM | POA: Diagnosis not present

## 2020-03-25 DIAGNOSIS — K219 Gastro-esophageal reflux disease without esophagitis: Secondary | ICD-10-CM | POA: Diagnosis present

## 2020-03-25 DIAGNOSIS — R579 Shock, unspecified: Secondary | ICD-10-CM | POA: Diagnosis present

## 2020-03-25 DIAGNOSIS — Z043 Encounter for examination and observation following other accident: Secondary | ICD-10-CM | POA: Diagnosis not present

## 2020-03-25 LAB — CBC WITH DIFFERENTIAL/PLATELET
Abs Immature Granulocytes: 0.15 10*3/uL — ABNORMAL HIGH (ref 0.00–0.07)
Basophils Absolute: 0 10*3/uL (ref 0.0–0.1)
Basophils Relative: 0 %
Eosinophils Absolute: 0 10*3/uL (ref 0.0–0.5)
Eosinophils Relative: 0 %
HCT: 38.6 % (ref 36.0–46.0)
Hemoglobin: 12 g/dL (ref 12.0–15.0)
Immature Granulocytes: 1 %
Lymphocytes Relative: 4 %
Lymphs Abs: 0.5 10*3/uL — ABNORMAL LOW (ref 0.7–4.0)
MCH: 24.5 pg — ABNORMAL LOW (ref 26.0–34.0)
MCHC: 31.1 g/dL (ref 30.0–36.0)
MCV: 78.8 fL — ABNORMAL LOW (ref 80.0–100.0)
Monocytes Absolute: 0.9 10*3/uL (ref 0.1–1.0)
Monocytes Relative: 7 %
Neutro Abs: 10.4 10*3/uL — ABNORMAL HIGH (ref 1.7–7.7)
Neutrophils Relative %: 88 %
Platelets: 117 10*3/uL — ABNORMAL LOW (ref 150–400)
RBC: 4.9 MIL/uL (ref 3.87–5.11)
RDW: 15.3 % (ref 11.5–15.5)
WBC: 11.9 10*3/uL — ABNORMAL HIGH (ref 4.0–10.5)
nRBC: 0 % (ref 0.0–0.2)

## 2020-03-25 LAB — URINALYSIS, ROUTINE W REFLEX MICROSCOPIC
Bilirubin Urine: NEGATIVE
Glucose, UA: NEGATIVE mg/dL
Ketones, ur: 5 mg/dL — AB
Nitrite: NEGATIVE
Protein, ur: 30 mg/dL — AB
Specific Gravity, Urine: 1.021 (ref 1.005–1.030)
WBC, UA: >50 WBC/hpf — ABNORMAL HIGH (ref 0–5)
pH: 5 (ref 5.0–8.0)

## 2020-03-25 LAB — HEMOGLOBIN A1C
Hgb A1c MFr Bld: 6.3 % — ABNORMAL HIGH (ref 4.8–5.6)
Mean Plasma Glucose: 134.11 mg/dL

## 2020-03-25 LAB — COMPREHENSIVE METABOLIC PANEL
ALT: 53 U/L — ABNORMAL HIGH (ref 0–44)
AST: 74 U/L — ABNORMAL HIGH (ref 15–41)
Albumin: 3.1 g/dL — ABNORMAL LOW (ref 3.5–5.0)
Alkaline Phosphatase: 74 U/L (ref 38–126)
Anion gap: 15 (ref 5–15)
BUN: 37 mg/dL — ABNORMAL HIGH (ref 8–23)
CO2: 21 mmol/L — ABNORMAL LOW (ref 22–32)
Calcium: 9.1 mg/dL (ref 8.9–10.3)
Chloride: 104 mmol/L (ref 98–111)
Creatinine, Ser: 2 mg/dL — ABNORMAL HIGH (ref 0.44–1.00)
GFR, Estimated: 26 mL/min — ABNORMAL LOW (ref >60)
Glucose, Bld: 189 mg/dL — ABNORMAL HIGH (ref 70–99)
Potassium: 4.4 mmol/L (ref 3.5–5.1)
Sodium: 140 mmol/L (ref 135–145)
Total Bilirubin: 0.6 mg/dL (ref 0.3–1.2)
Total Protein: 7.1 g/dL (ref 6.5–8.1)

## 2020-03-25 LAB — LACTIC ACID, PLASMA
Lactic Acid, Venous: 3.5 mmol/L (ref 0.5–1.9)
Lactic Acid, Venous: 1.1 mmol/L (ref 0.5–1.9)
Lactic Acid, Venous: 1.1 mmol/L (ref 0.5–1.9)
Lactic Acid, Venous: 1 mmol/L (ref 0.5–1.9)

## 2020-03-25 LAB — APTT: aPTT: 36 seconds (ref 24–36)

## 2020-03-25 LAB — PROTIME-INR
INR: 1.7 — ABNORMAL HIGH (ref 0.8–1.2)
Prothrombin Time: 19.3 seconds — ABNORMAL HIGH (ref 11.4–15.2)

## 2020-03-25 LAB — D-DIMER, QUANTITATIVE: D-Dimer, Quant: >20 ug/mL-FEU — ABNORMAL HIGH (ref 0.00–0.50)

## 2020-03-25 LAB — SARS CORONAVIRUS 2 BY RT PCR (HOSPITAL ORDER, PERFORMED IN ~~LOC~~ HOSPITAL LAB): SARS Coronavirus 2: NEGATIVE

## 2020-03-25 LAB — CK: Total CK: 40 U/L (ref 38–234)

## 2020-03-25 MED ORDER — SODIUM CHLORIDE 0.9 % IV BOLUS
1000.0000 mL | Freq: Once | INTRAVENOUS | Status: AC
  Administered 2020-03-25: 1000 mL via INTRAVENOUS

## 2020-03-25 MED ORDER — HEPARIN SODIUM (PORCINE) 5000 UNIT/ML IJ SOLN
5000.0000 [IU] | Freq: Three times a day (TID) | INTRAMUSCULAR | Status: DC
  Administered 2020-03-25 – 2020-03-26 (×2): 5000 [IU] via SUBCUTANEOUS
  Filled 2020-03-25 (×2): qty 1

## 2020-03-25 MED ORDER — SODIUM CHLORIDE 0.9 % IV SOLN: INTRAVENOUS | Status: DC

## 2020-03-25 MED ORDER — SODIUM CHLORIDE 0.9 % IV SOLN
1.0000 g | Freq: Once | INTRAVENOUS | Status: AC
  Administered 2020-03-25: 1 g via INTRAVENOUS
  Filled 2020-03-25: qty 10

## 2020-03-25 MED ORDER — ACETAMINOPHEN 650 MG RE SUPP: 650.0000 mg | Freq: Four times a day (QID) | RECTAL | Status: DC | PRN

## 2020-03-25 MED ORDER — HYDROCODONE-ACETAMINOPHEN 5-325 MG PO TABS
1.0000 | ORAL_TABLET | ORAL | Status: DC | PRN
  Administered 2020-03-25 – 2020-03-26 (×2): 1 via ORAL
  Administered 2020-04-03: 2 via ORAL
  Administered 2020-04-03: 1 via ORAL
  Administered 2020-04-05: 2 via ORAL
  Filled 2020-03-25 (×3): qty 1
  Filled 2020-03-25 (×3): qty 2

## 2020-03-25 MED ORDER — SODIUM CHLORIDE 0.9 % IV SOLN
500.0000 mg | INTRAVENOUS | Status: DC
  Administered 2020-03-26 – 2020-03-28 (×3): 500 mg via INTRAVENOUS
  Filled 2020-03-25 (×3): qty 500

## 2020-03-25 MED ORDER — SODIUM CHLORIDE 0.9 % IV SOLN
500.0000 mg | Freq: Once | INTRAVENOUS | Status: AC
  Administered 2020-03-25: 500 mg via INTRAVENOUS
  Filled 2020-03-25: qty 500

## 2020-03-25 MED ORDER — ACETAMINOPHEN 325 MG PO TABS
650.0000 mg | ORAL_TABLET | Freq: Four times a day (QID) | ORAL | Status: DC | PRN
  Administered 2020-03-26 – 2020-04-08 (×5): 650 mg via ORAL
  Filled 2020-03-25 (×5): qty 2

## 2020-03-25 MED ORDER — SODIUM CHLORIDE 0.9 % IV SOLN
2.0000 g | INTRAVENOUS | Status: DC
  Administered 2020-03-26 – 2020-03-28 (×3): 2 g via INTRAVENOUS
  Filled 2020-03-25: qty 2
  Filled 2020-03-25 (×2): qty 20

## 2020-03-25 NOTE — Telephone Encounter (Signed)
Received call from [place] at [facility]. Patient's daughter [name] called Dr. Rich Reining office concerned about patient's status (new urinary incontinence, unstable vital signs, decrease in oral intake, etc) and wanted to know if Dr. Owens Shark could facilitate a direct admit to the hospital. Daleen Snook advised daughter to call EMS or take patient to nearest ED. Daughter informed Daleen Snook that she had called EMS, paramedics came out to house to assess patient, and they told patient and family that the current ED wait time was 3+ hours so it would be better for family to reach out to one of patient's doctors and see if they could get patient directly admitted to hospital. Daleen Snook informed [name] that because Dr. Owens Shark was patient's surgeon, and he hasn't seen patient since surgery, it would not be appropriate for him to try and get patient directly admitted to the hospital. Daleen Snook told [name] that she would try and get in touch with Dr. Pearlie Oyster office and relay situation.   Informed Daleen Snook that patient and family had decided not to continue radiation, and patient had not been in the department since 03/05/20. Stated that patient seemed to be enrolled with Palliative Care. Informed I would try and reach out the Palliative Care and call Daleen Snook back with an update.   Called [facility] but was unable to speak with a team member. I left a voicemail requesting a call back or for someone to contact patient/daughter to be updated on patient's status. After hanging up, was then updated by Elisabeth Cara Navigator that she spoke with [facility] and advised her to take patient to ED. Anderson Malta stated [name] verbalized understanding and agreement of recommendations.   Called Krista back with above update. No other needs identified at this time, but will continue to monitor and be available for support as needed.

## 2020-03-25 NOTE — Progress Notes (Signed)
Manufacturing engineer [facility]) Community Based Palliative Care       This patient has been referred to our palliative care services in the community.  ACC will continue to follow for any discharge planning needs and to coordinate admission onto palliative care.    Thank you for the opportunity to participate in this patient's care.     Domenic Moras, BSN, RN [facility] Liaison   [phone]

## 2020-03-25 NOTE — TOC Initial Note (Signed)
Transition of Care [facility]) - Initial/Assessment Note    Patient Details  Name: Amanda Rivers MRN: [id number] Date of Birth: 1945/08/30  Transition of Care [facility]) CM/SW Contact:    Erenest Rasher, RN Phone Number: [phone] 03/25/2020, 3:21 PM  Clinical Narrative:                  TOC CM spoke to pt's dtr, Bishop Limbo and states pt lives alone. She is requesting pt go to SNF rehab. Gave permission to fax referral to SNF and complete FL2. She has RW at home and had initial meeting with [facility] on today who recommended pt come to hospital. Pt is currently not eating and very weak per dtr. Clarified status with dtr and pt is a full code. Ed provider updated. Will need PT evaluation.     Expected Discharge Plan: (P) Skilled Nursing Facility Barriers to Discharge: (P) Continued Medical Work up   Patient Goals and CMS Choice Patient states their goals for this hospitalization and ongoing recovery are:: (P) requesting SNF rehab CMS Medicare.gov Compare Post Acute Care list provided to:: (P) Patient Represenative (must comment) Andee Poles daughter)    Expected Discharge Plan and Services Expected Discharge Plan: (P) [place]                                              Prior Living Arrangements/Services                       Activities of Daily Living Home Assistive Devices/Equipment: Eyeglasses ADL Screening (condition at time of admission) Patient's cognitive ability adequate to safely complete daily activities?: Yes Is the patient deaf or have difficulty hearing?: No Does the patient have difficulty seeing, even when wearing glasses/contacts?: No Does the patient have difficulty concentrating, remembering, or making decisions?: No Patient able to express need for assistance with ADLs?: Yes Does the patient have difficulty dressing or bathing?: Yes Independently performs ADLs?: No Communication:  Independent Dressing (OT): Needs assistance Is this a change from baseline?: Change from baseline, expected to last >3 days Grooming: Independent Feeding: Needs assistance Is this a change from baseline?: Change from baseline, expected to last >3 days Bathing: Needs assistance Is this a change from baseline?: Change from baseline, expected to last >3 days Toileting: Needs assistance Is this a change from baseline?: Change from baseline, expected to last >3days In/Out Bed: Needs assistance Is this a change from baseline?: Change from baseline, expected to last >3 days Walks in Home: Needs assistance Is this a change from baseline?: Change from baseline, expected to last >3 days Does the patient have difficulty walking or climbing stairs?: Yes (secondary to weakness) Weakness of Legs: Both Weakness of Arms/Hands: None  Permission Sought/Granted                  Emotional Assessment              Admission diagnosis:  failure to thrive Patient Active Problem List   Diagnosis Date Noted  . 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 Tremor 06/02/2019  . HTN (hypertension) 06/02/2019  . HLD (hyperlipidemia) 06/02/2019  . History of lung cancer 06/02/2019  . Chronic hyponatremia 06/02/2019  . GERD (gastroesophageal reflux disease) 06/02/2019  . Allergic  rhinitis 06/02/2019   PCP:  Lucianne Lei, MD Pharmacy:   [facility] [place], [place] [address] [place] [address] Phone: [phone] Fax: [phone]     Social Determinants of Health (SDOH) Interventions    Readmission Risk Interventions No flowsheet data found.

## 2020-03-25 NOTE — ED Notes (Signed)
[name], daughter, [phone] would like an update from the nurse or doctor.

## 2020-03-25 NOTE — H&P (Signed)
History and Physical    Amanda Rivers GNF:[id number] DOB: [birth year] DOA: 03/25/2020  PCP: Lucianne Lei, MD  Patient coming from: Home  Chief Complaint: leg pain.   HPI: Amanda Rivers is a 76 y.o. female with medical history significant of HLD, HTN. Presenting with leg pain. Patient is poor historian. History from granddaughter. 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 She had her last dose almost 3 weeks ago. Her granddaughter reports that she has "gone downhill" since starting radiation. She has become progressively weak and finding it harder to move. She complains of leg weakness and pain. She has had poor appetite that has worsened in the last 3 days (1/2 cup of soup is all she has eaten in 3 days). She is increasingly not seeming herself. She has become more incontinent and somewhat confused. The family has noted some difficulty breathing and cough. They became concerned and decided to bring her to the ED today.     ED Course: She was found to have AKI. CXR was positive for PNA. She was started on rocephin, zithro, fluids. TRH was called for admission.   Review of Systems:  Denies CP, palpitations, N/V/D/F. Reports leg pain, some cough, fatigue. Review of systems is otherwise negative for all not mentioned in HPI.   PMHx Past Medical History:  Diagnosis Date  . HLD (hyperlipidemia)   . Hypertension   . Malignant neoplasm of bronchus and lung, unspecified site   . Reflux     PSHx Past Surgical History:  Procedure Laterality Date  . MAXILLECTOMY  06/05/2019   [facility]  . R Vats R upper lobectomy with node dissection  06/21/06  . SELECTIVE NECK DISSECTION  12/09/2019   [facility]    SocHx  reports that she quit smoking about 13 years ago. She has never used smokeless tobacco. She reports previous alcohol use. She reports that she does not use drugs.  No Known Allergies  FamHx Family History  Problem Relation Age of Onset  . Brain cancer Paternal Uncle    . Brain cancer Paternal Uncle     Prior to Admission medications   Medication Sig Start Date End Date Taking? Authorizing Provider  amLODipine-olmesartan (AZOR) 10-40 MG tablet Take 1 tablet by mouth daily.    [provider]  dexlansoprazole (DEXILANT) 60 MG capsule Take 60 mg by mouth daily.    [provider]  nebivolol (BYSTOLIC) 10 MG tablet Take 10 mg by mouth daily.    [provider]  primidone (MYSOLINE) 50 MG tablet Take 50 mg by mouth 3 (three) times daily. 12/15/19 12/14/20  [provider]  rosuvastatin (CRESTOR) 20 MG tablet Take 20 mg by mouth daily. 10/27/19   [provider]  spironolactone (ALDACTONE) 25 MG tablet Take 25 mg by mouth 2 (two) times daily.    [provider]    Physical Exam: Vitals:   03/25/20 1345 03/25/20 1415 03/25/20 1430 03/25/20 1618  BP: (!) 102/59 106/69 114/67 107/65  Pulse:  90 76 76  Resp: 20 18 18 16   Temp:      TempSrc:      SpO2: 95% 100% 96% 98%  Weight:      Height:        General: 75 y.o. female resting in bed in NAD, cachetitx Eyes: PERRL, normal sclera, sunken ENMT: Nares patent w/o discharge, orophaynx clear, poor dentition, ears w/o discharge/lesions/ulcers; temporal wasting Neck: thin, trachea midline Cardiovascular: tachy, +S1, S2, no m/g/r, equal pulses throughout  Respiratory: LUL rhonchi, decreased at bases, no w/r, normal WOB GI: BS+, NDNT, no masses noted, no organomegaly noted MSK: No e/c/c Neuro: A&O x 2, no focal deficits Psyc: Appropriate interaction but flat affect, calm/cooperative  Labs on Admission: I have personally reviewed following labs and imaging studies  CBC: Recent Labs  Lab 03/25/20 1248  WBC 11.9*  NEUTROABS 10.4*  HGB 12.0  HCT 38.6  MCV 78.8*  PLT 161*   Basic Metabolic Panel: Recent Labs  Lab 03/25/20 1400  NA 140  K 4.4  CL 104  CO2 21*  GLUCOSE 189*  BUN 37*  CREATININE 2.00*  CALCIUM 9.1   GFR: Estimated Creatinine  Clearance: 25.8 mL/min (A) (by C-G formula based on SCr of 2 mg/dL (H)). Liver Function Tests: Recent Labs  Lab 03/25/20 1400  AST 74*  ALT 53*  ALKPHOS 74  BILITOT 0.6  PROT 7.1  ALBUMIN 3.1*   No results for input(s): LIPASE, AMYLASE in the last 168 hours. No results for input(s): AMMONIA in the last 168 hours. Coagulation Profile: Recent Labs  Lab 03/25/20 1248  INR 1.7*   Cardiac Enzymes: Recent Labs  Lab 03/25/20 1400  CKTOTAL 40   BNP (last 3 results) No results for input(s): PROBNP in the last 8760 hours. HbA1C: No results for input(s): HGBA1C in the last 72 hours. CBG: No results for input(s): GLUCAP in the last 168 hours. Lipid Profile: No results for input(s): CHOL, HDL, LDLCALC, TRIG, CHOLHDL, LDLDIRECT in the last 72 hours. Thyroid Function Tests: No results for input(s): TSH, T4TOTAL, FREET4, T3FREE, THYROIDAB in the last 72 hours. Anemia Panel: No results for input(s): VITAMINB12, FOLATE, FERRITIN, TIBC, IRON, RETICCTPCT in the last 72 hours. Urine analysis:    Component Value Date/Time   COLORURINE YELLOW 06/10/2008 [place] 06/10/2008 1840   LABSPEC 1.009 06/10/2008 1840   PHURINE 6.5 06/10/2008 1840   GLUCOSEU NEGATIVE 06/10/2008 1840   HGBUR NEGATIVE 06/10/2008 1840   BILIRUBINUR NEGATIVE 06/10/2008 1840   KETONESUR NEGATIVE 06/10/2008 1840   PROTEINUR NEGATIVE 06/10/2008 1840   UROBILINOGEN 0.2 06/10/2008 1840   NITRITE NEGATIVE 06/10/2008 1840   LEUKOCYTESUR  06/10/2008 1840    NEGATIVE MICROSCOPIC NOT DONE ON URINES WITH NEGATIVE PROTEIN, BLOOD, LEUKOCYTES, NITRITE, OR GLUCOSE <1000 mg/dL.    Radiological Exams on Admission: DG Lumbar Spine Complete  Result Date: 03/25/2020 CLINICAL DATA:  Fall. EXAM: LUMBAR SPINE - COMPLETE 4+ VIEW COMPARISON:  None. FINDINGS: Normal alignment. Negative for fracture. Mild disc degeneration. Facet degeneration L4-5 and L5-S1 Atherosclerotic calcification aorta and iliac arteries without  aneurysm. IMPRESSION: Negative for fracture Electronically Signed   By: Franchot Gallo M.D.   On: 03/25/2020 15:27   DG Chest Port 1 View  Result Date: 03/25/2020 CLINICAL DATA:  Fall EXAM: PORTABLE CHEST 1 VIEW COMPARISON:  06/10/2008 FINDINGS: Extensive airspace disease left upper lobe consistent with pneumonia. Underlying emphysema. Blunting right costophrenic angle unchanged compatible with effusion. Right lung otherwise clear Heart size and vascularity normal. Atherosclerotic calcification aortic arch. IMPRESSION: Extensive infiltrate left upper lobe consistent with pneumonia. Possible TB. Small right pleural effusion. Electronically Signed   By: Franchot Gallo M.D.   On: 03/25/2020 15:29   DG Hip Unilat With Pelvis 2-3 Views Left  Result Date: 03/25/2020 CLINICAL DATA:  Fall.  History of lung cancer EXAM: DG HIP (WITH OR WITHOUT PELVIS) 2-3V LEFT COMPARISON:  None. FINDINGS: There is no evidence of hip fracture or dislocation. There is no evidence of arthropathy or other  focal bone abnormality. IMPRESSION: Negative. Electronically Signed   By: Franchot Gallo M.D.   On: 03/25/2020 15:26   DG Hip Unilat With Pelvis 2-3 Views Right  Result Date: 03/25/2020 CLINICAL DATA:  Fall.  Hip pain.  Lung cancer history EXAM: DG HIP (WITH OR WITHOUT PELVIS) 2-3V RIGHT COMPARISON:  None. FINDINGS: There is no evidence of hip fracture or dislocation. There is no evidence of arthropathy or other focal bone abnormality. IMPRESSION: Negative. Electronically Signed   By: Franchot Gallo M.D.   On: 03/25/2020 15:26    EKG: Independently reviewed. Sinus tach, no st elevations  Assessment/Plan LUL PNA Sepsis secondary to PNA     - admit to inpatient, tele     - CXR with LUL PNA, possible TB     - started on rocephin/zithro in ED; continue     - spoke with ID: recommended sputum TB test, but hold on therapy for now     - sepsis criteria: tachycardia, tachypnea (RR 22 during interview), source: PNA, lactic acid  3.5  AKI     - baseline Scr is 1.0 - 1.2; she's 2.0 at admission     - fluids, check renal US, follow UOP     - watch nephrotoxins  Elevated LFTs     - check hepatitis panel, abdominal US  Thrombocytopenia     - likely secondary to radiation therapy; no evidence of bleed, follow  B/l leg pain     - check d-dimer; hold statin     - if d-dimer elevated, check dopplers  Hyperglycemia     - check A1c  Severe-protein calorie malnutrition     - dietary consult  Squamous cell carcinoma of maxilla     - followed by Dr. Isidore Moos and Dr. Chryl Heck     - last radiation 03/05/20     - following with pulm for bronch for concerning nodules in LUL on PET scan   DVT prophylaxis: lovenox  Code Status: FULL  Family Communication: Spoke with granddtr by phone  Consults called: [facility] (Dr. Johnnye Sima)  Status is: Inpatient  Remains inpatient appropriate because:Inpatient level of care appropriate due to severity of illness   Dispo: The patient is from: Home              Anticipated d/c is to: SNF              Anticipated d/c date is: 3 days              Patient currently is not medically stable to d/c.   Difficult to place patient No  Time spent coordinating admission: [facility]  If 7PM-7AM, please contact night-coverage [email or web address]  03/25/2020, 4:34 PM

## 2020-03-25 NOTE — ED Notes (Signed)
Patient attached to external female catheter 

## 2020-03-25 NOTE — ED Triage Notes (Signed)
Transported by [facility] from home-- family reports FTT x 3 weeks, states patient has not been eating and drinking and has developed new onset incontinence. Hx of throat/mouth cancer.

## 2020-03-25 NOTE — ED Provider Notes (Signed)
[facility] DEPT Provider Note   CSN: [id number] Arrival date & time: 03/25/20  1149     History Chief Complaint  Patient presents with  . Failure To Thrive    Markayla Reichart is a 75 y.o. female.  75 year old female with history of malignant lung cancer brought in by EMS from home where she states she lives alone and is unable to care for herself/get out of bed due to pain in her legs. Patient is alert to person and place. Denies abdominal pain, chest pain, weakness, SHOB, fevers, changes in bowel or bladder habits. Patient has recently decided to stop aggressive treatment for her cancer (causing leg pain) but does not feel she is ready for palliative care, feels like she is not ready to die. Patient denies taking anything for pain.   Call to patient's daughter [name], chemo/radiation at [facility] for the past month, has recently stopped taking medicine, concern for dementia (daughter lives 2 hours away). Pt is unable to stand/walk, home health nurse Phineas Real) from hospice went to the house this morning. Home health nurse is new to patient's care team. Felt patient was likely dehydrated and needed admission vs SNF placement.   Call to Granddaughter was at the house today, states patient was incontinent of stool. Has not had anything to eat or drink today.  Family checks on patient every few days, has had very small PO intake. Discussion with daughter and granddaughter, patient does not have any advanced directives and has not discussed end of life care with family.         Past Medical History:  Diagnosis Date  . HLD (hyperlipidemia)   . Hypertension   . Malignant neoplasm of bronchus and lung, unspecified site   . Reflux     Patient Active Problem List   Diagnosis Date Noted  . 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 Tremor 06/02/2019  . HTN (hypertension) 06/02/2019  . HLD (hyperlipidemia)  06/02/2019  . History of lung cancer 06/02/2019  . Chronic hyponatremia 06/02/2019  . GERD (gastroesophageal reflux disease) 06/02/2019  . Allergic rhinitis 06/02/2019    Past Surgical History:  Procedure Laterality Date  . MAXILLECTOMY  06/05/2019   [facility]  . R Vats R upper lobectomy with node dissection  06/21/06  . SELECTIVE NECK DISSECTION  12/09/2019   [facility]     OB History   No obstetric history on file.     Family History  Problem Relation Age of Onset  . Brain cancer Paternal Uncle   . Brain cancer Paternal Uncle     Social History   Tobacco Use  . Smoking status: Former Smoker    Quit date: 09/25/2006    Years since quitting: 13.5  . Smokeless tobacco: Never Used  Vaping Use  . Vaping Use: Never used  Substance Use Topics  . Alcohol use: Not Currently    Comment: occasional beer  . Drug use: Never    Home Medications Prior to Admission medications   Medication Sig Start Date End Date Taking? Authorizing Provider  amLODipine-olmesartan (AZOR) 10-40 MG tablet Take 1 tablet by mouth daily.    [provider]  dexlansoprazole (DEXILANT) 60 MG capsule Take 60 mg by mouth daily.    [provider]  nebivolol (BYSTOLIC) 10 MG tablet Take 10 mg by mouth daily.    [provider]  primidone (MYSOLINE) 50 MG tablet Take 50 mg  by mouth 3 (three) times daily. 12/15/19 12/14/20  [provider]  rosuvastatin (CRESTOR) 20 MG tablet Take 20 mg by mouth daily. 10/27/19   [provider]  spironolactone (ALDACTONE) 25 MG tablet Take 25 mg by mouth 2 (two) times daily.    [provider]    Allergies    Patient has no known allergies.  Review of Systems   Review of Systems  Constitutional: Negative for fever.  Respiratory: Negative for shortness of breath.   Cardiovascular: Negative for chest pain and leg swelling.  Gastrointestinal: Negative for abdominal pain, constipation, diarrhea, nausea and vomiting.  Genitourinary:  Negative for dysuria.  Musculoskeletal: Positive for arthralgias and gait problem. Negative for back pain.  Skin: Negative for color change, rash and wound.  Allergic/Immunologic: Positive for immunocompromised state.  Neurological: Negative for weakness.  All other systems reviewed and are negative.   Physical Exam Updated Vital Signs BP 106/69   Pulse 90   Temp 98.4 F (36.9 C) (Oral)   Resp 18   Ht 5' 11.5" (1.816 m)   Wt 66.2 kg   SpO2 100%   BMI 20.08 kg/m   Physical Exam Vitals and nursing note reviewed.  Constitutional:      General: She is not in acute distress.    Appearance: She is well-developed and well-nourished. She is not diaphoretic.  HENT:     Head: Normocephalic and atraumatic.  Cardiovascular:     Rate and Rhythm: Normal rate and regular rhythm.     Pulses: Normal pulses.     Heart sounds: Normal heart sounds.  Pulmonary:     Effort: Pulmonary effort is normal.     Breath sounds: Normal breath sounds.  Abdominal:     Palpations: Abdomen is soft.     Tenderness: There is no abdominal tenderness.  Musculoskeletal:        General: No swelling, tenderness, deformity or signs of injury.     Right lower leg: No edema.     Left lower leg: No edema.  Skin:    General: Skin is warm and dry.     Findings: No erythema or rash.  Neurological:     Mental Status: She is alert and oriented to person, place, and time.     Sensory: No sensory deficit.     Motor: No weakness.  Psychiatric:        Mood and Affect: Mood and affect normal.        Behavior: Behavior normal.     ED Results / Procedures / Treatments   Labs (all labs ordered are listed, but only abnormal results are displayed) Labs Reviewed  LACTIC ACID, PLASMA - Abnormal; Notable for the following components:      Result Value   Lactic Acid, Venous 3.5 (*)    All other components within normal limits  CBC WITH DIFFERENTIAL/PLATELET - Abnormal; Notable for the following components:   WBC 11.9  (*)    MCV 78.8 (*)    MCH 24.5 (*)    Platelets 117 (*)    Neutro Abs 10.4 (*)    Lymphs Abs 0.5 (*)    Abs Immature Granulocytes 0.15 (*)    All other components within normal limits  PROTIME-INR - Abnormal; Notable for the following components:   Prothrombin Time 19.3 (*)    INR 1.7 (*)    All other components within normal limits  COMPREHENSIVE METABOLIC PANEL - Abnormal; Notable for the following components:   CO2 21 (*)  Glucose, Bld 189 (*)    BUN 37 (*)    Creatinine, Ser 2.00 (*)    Albumin 3.1 (*)    AST 74 (*)    ALT 53 (*)    GFR, Estimated 26 (*)    All other components within normal limits  CULTURE, BLOOD (SINGLE)  URINE CULTURE  SARS CORONAVIRUS 2 BY RT PCR (HOSPITAL ORDER, [facility])  APTT  CK  LACTIC ACID, PLASMA  URINALYSIS, ROUTINE W REFLEX MICROSCOPIC    EKG EKG Interpretation  Date/Time:  Thursday March 25 2020 13:29:22 EST Ventricular Rate:  86 PR Interval:    QRS Duration: 82 QT Interval:  366 QTC Calculation: 438 R Axis:   62 Text Interpretation: Sinus rhythm Abnormal R-wave progression, early transition No STEMI Confirmed by Nanda Quinton [phone]) on 03/25/2020 1:39:38 PM   Radiology No results found.  Procedures .Critical Care Performed by: Tacy Learn, PA-C Authorized by: Tacy Learn, PA-C   Critical care provider statement:    Critical care time (minutes):  45   Critical care was time spent personally by me on the following activities:  Discussions with consultants, evaluation of patient's response to treatment, examination of patient, ordering and performing treatments and interventions, ordering and review of laboratory studies, ordering and review of radiographic studies, pulse oximetry, re-evaluation of patient's condition, obtaining history from patient or surrogate and review of old charts     Medications Ordered in ED Medications  sodium chloride 0.9 % bolus 1,000 mL (1,000 mLs  Intravenous New Bag/Given 03/25/20 1331)  sodium chloride 0.9 % bolus 1,000 mL (1,000 mLs Intravenous New Bag/Given 03/25/20 1417)  cefTRIAXone (ROCEPHIN) 1 g in sodium chloride 0.9 % 100 mL IVPB (1 g Intravenous New Bag/Given 03/25/20 1417)    ED Course  I have reviewed the triage vital signs and the nursing notes.  Pertinent labs & imaging results that were available during my care of the patient were reviewed by me and considered in my medical decision making (see chart for details).  Clinical Course as of 03/25/20 1500  Thu Mar 26, 5483  166 75 year old female brought in by EMS from home where she lives alone, was seen today for the first time by palliative care RN who sent patient to the ER (tried for direct admit but unable to direct admit, see notes).  On arrival, BP 80/53. However on repeat without intervention has improved to 105/64 (new BP cuff). Code sepsis was initiated on hypotension with concern for patient's ability to provide an accurate history. Discussion with family who confirms all of the information provided by patient. Differential includes but not limited to UTI, PNA, COVID, rhabdomyolysis, dehydration.  [LM]  1356 WBC slightly elevated at 11.9 with increase in neutrophils. Lactic elevated at 3.5. Discussed with Dr. Laverta Baltimore, ER attending, plan is to order Rocephin for possible infectious source, additional work up pending, plan for likely admission.  [LM]  1403 Further review of records, from 1/18, daughter reported patient missed appointment due to a fall with hip pain. Patient has good ROM hips today however with report of pain in her legs limiting her ability to walk, will add on XR hips/pelvis.  [LM]  1500 Signed out at end of shift pending additional work-up with plan for admission. [LM]    Clinical Course User Index [LM] Roque Lias   MDM Rules/Calculators/A&P  Final Clinical Impression(s) / ED Diagnoses Final diagnoses:  Hypotension,  unspecified hypotension type  Lactic acidosis  Leukocytosis, unspecified type    Rx / DC Orders ED Discharge Orders    None       Tacy Learn, PA-C 03/25/20 1500    Margette Fast, MD 03/26/20 [phone]

## 2020-03-25 NOTE — ED Notes (Signed)
ED Provider at bedside. 

## 2020-03-25 NOTE — Progress Notes (Signed)
[facility] Consult Note Telephone: [phone]  Fax: [phone]  PATIENT NAME: Amanda Rivers [address] [place] [address] [phone] (home)  DOB: 1945/08/18 MRN: [id number]  PRIMARY CARE PROVIDER:    Lucianne Lei, MD,  [address] [place] [address] [phone]  REFERRING PROVIDER:   Eppie Gibson, MD [address] [place],  [place] [address] [phone]  RESPONSIBLE PARTY:   Extended Emergency Contact Information Primary Emergency Contact: Rivers,Amanda Address: [place] [address] [place] Phone: [phone] Mobile Phone: [phone] Relation: Daughter Secondary Emergency Contact: Amanda Rivers Mobile Phone: [phone] Relation: Granddaughter Interpreter needed? No  I met face to face with patient and family in home/facility. Patient present but unable to substantively engage in process.   ASSESSMENT AND RECOMMENDATIONS:   Advance Care Planning: Visit at the request of Dr. Lanell Rivers for palliative consult. Palliative NP arrived to find patient and grand daughter Amanda Rivers present in the home. She and her mother Amanda Rivers via phone, report patient having a marked decline over night. Family requesting for patient to be evaluated in hospital due to declines and changes. Briefly reviewed Palliative services while awaiting EMS arrival. Palliative care will continue to provide support to patient, family and the medical team.  Goal of care: Family express for patient to be sent to [facility], [facility] for further evaluation given marked decline.   Directives: No Advanced Directives currently in place; will address on next visit.   Symptom Management: Patient with marked weakness, confusion; recommend further evaluation at ER. [facility] home [facility] was to start upcoming Monday. Palliative will continue to follow and address symptom management post acute  hospitalization.   Follow up Palliative Care Visit: Palliative care will continue to follow for goals of care clarification, advanced care planning and symptom management.Will follow up post ER/hospitalization for Palliative consult.  Family /Caregiver/Community Supports:    I spent 30 minutes providing this consultation, time includes time spent with patient/family, chart review, provider coordination, and documentation. More than 50% of the time in this consultation was spent counseling and coordinating communication.   CHIEF COMPLAINT: "Decline since radiation"  History obtained from review of EMR, discussion/interview with family, caregiver. Records reviewed and summarized below.  HISTORY OF PRESENT ILLNESS:  Amanda Rivers is a 75 y.o. year old female with multiple medical problems including head and neck cancer, hypertension. 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  Palliative Care was asked to follow this patient by consultation request of Amanda Gibson, MD to help address advance care planning and goals of care. This is an initial visit.   [name] has experienced a marked decline since recent radiation treatment. Daughter Amanda Rivers reports patient having more difficulty staying at home, weaker, and not eating well in past 2 weeks. Since yesterday, patient presents with increased confusion, not recognizing familiars, increased difficulty ambulating, bowel and bladder incontinence. She reports a fall in past week. Patient just states she is "not doing well." She does endorse pain to RLE that has been present "for months."   Upon arrival to home, daughter Amanda Rivers is requesting for patient to be transferred to ER for further evaluation. Family does not feel they would be able to transfer her safely via private vehicle. Grand daughter Amanda Rivers called 911 for EMS transport. Daughter Amanda Rivers  is en route to  patient's home.   CODE STATUS: Full Code  PPS: 40%  HOSPICE ELIGIBILITY/DIAGNOSIS: TBD  PHYSICAL EXAM / ROS:   General: frail, weak appearing.  Physcial exam otherwise deferred.    PAST MEDICAL HISTORY:  Past Medical History:  Diagnosis Date  . HLD (hyperlipidemia)   . Hypertension   . Malignant neoplasm of bronchus and lung, unspecified site   . Reflux     SOCIAL HX:  Social History   Tobacco Use  . Smoking status: Former Smoker    Quit date: 09/25/2006    Years since quitting: 13.5  . Smokeless tobacco: Never Used  Substance Use Topics  . Alcohol use: Not Currently    Comment: occasional beer   FAMILY HX:  Family History  Problem Relation Age of Onset  . Brain cancer Paternal Uncle   . Brain cancer Paternal Uncle     ALLERGIES: No Known Allergies   PERTINENT MEDICATIONS:  Outpatient Encounter Medications as of 03/25/2020  Medication Sig  . amLODipine-olmesartan (AZOR) 10-40 MG tablet Take 1 tablet by mouth daily.  Marland Kitchen dexlansoprazole (DEXILANT) 60 MG capsule Take 60 mg by mouth daily.  . nebivolol (BYSTOLIC) 10 MG tablet Take 10 mg by mouth daily.  . primidone (MYSOLINE) 50 MG tablet Take 50 mg by mouth 3 (three) times daily.  . rosuvastatin (CRESTOR) 20 MG tablet Take 20 mg by mouth daily.  Marland Kitchen spironolactone (ALDACTONE) 25 MG tablet Take 25 mg by mouth 2 (two) times daily.  . [EXPIRED] cefTRIAXone (ROCEPHIN) 1 g in sodium chloride 0.9 % 100 mL IVPB   . [EXPIRED] sodium chloride 0.9 % bolus 1,000 mL   . [EXPIRED] sodium chloride 0.9 % bolus 1,000 mL    No facility-administered encounter medications on file as of 03/25/2020.     Thank you for the opportunity to participate in the care of@. The palliative care team will continue to follow. Please call our office at [phone] if we can be of additional assistance.  Amanda Slocumb, NP,  AGPCNP-C

## 2020-03-25 NOTE — ED Notes (Signed)
Re-attempted to call report, unsuccessful at this time. Will await call back

## 2020-03-25 NOTE — ED Provider Notes (Addendum)
Care received from [facility].  Please see her note for full HPI.  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  Home health team checked on her today and was concerned that she was dehydrated and not able to care for herself.  Patient newly incontinent of stool and urine.  There was an attempt to directly admit her however this was unsuccessful.  Initially arrived hypotensive, and thus code sepsis was initiated.  She had a slightly elevated WBC count of 11.9 and increased neutrophils, lactate of 3.5.  Rocephin was initiated for possible infective source.  Patient had complained of lower extremity pain, denied any injuries but did miss her last doctor's appointment secondary to fall.  Received care with plans to follow-up on imaging and the rest of her lab work and admit to the hospitalist.  Personally reviewed and interpreted her work-up.  -Covid test is negative -Chest x-ray consistent with left sided pneumonia.  Radiology question TB.  Plain films of the lumbar spine and pelvis without any abnormalities.  Given negative Covid test, added on Zithromax.  Consulted hospitalist team for admission.  Spoke with Dr. Marylyn Ishihara who will admit the patient for further evaluation and treatment. Physical Exam  BP 107/65   Pulse 76   Temp 98.4 F (36.9 C) (Oral)   Resp 16   Ht 5' 11.5" (1.816 m)   Wt 66.2 kg   SpO2 98%   BMI 20.08 kg/m   Physical Exam Vitals and nursing note reviewed.  Constitutional:      General: She is not in acute distress.    Appearance: She is well-developed and well-nourished. She is not ill-appearing or diaphoretic.  HENT:     Head: Normocephalic and atraumatic.  Eyes:     Conjunctiva/sclera: Conjunctivae normal.  Cardiovascular:     Rate and Rhythm: Normal rate and regular rhythm.     Heart sounds: No murmur heard.   Pulmonary:     Effort: Pulmonary effort is normal. No  respiratory distress.     Breath sounds: Normal breath sounds.  Abdominal:     Palpations: Abdomen is soft.     Tenderness: There is no abdominal tenderness.  Musculoskeletal:        General: No edema.     Cervical back: Neck supple.     Comments: Moving all 4 extremities, no significant lower extremity edema, redness, warmth  Skin:    General: Skin is warm and dry.  Neurological:     Mental Status: She is alert.  Psychiatric:        Mood and Affect: Mood and affect normal.     ED Course/Procedures   Clinical Course as of 03/25/20 1645  Thu Mar 25, 1017  552 75 year old female brought in by EMS from home where she lives alone, was seen today for the first time by palliative care RN who sent patient to the ER (tried for direct admit but unable to direct admit, see notes).  On arrival, BP 80/53. However on repeat without intervention has improved to 105/64 (new BP cuff). Code sepsis was initiated on hypotension with concern for patient's ability to provide an accurate history. Discussion with family who confirms all of the information provided by patient. Differential includes but not limited to UTI, PNA, COVID, rhabdomyolysis, dehydration.  [LM]  1356 WBC slightly elevated at 11.9 with increase in neutrophils. Lactic elevated at  3.5. Discussed with Dr. Laverta Baltimore, ER attending, plan is to order Rocephin for possible infectious source, additional work up pending, plan for likely admission.  [LM]  1403 Further review of records, from 1/18, daughter reported patient missed appointment due to a fall with hip pain. Patient has good ROM hips today however with report of pain in her legs limiting her ability to walk, will add on XR hips/pelvis.  [LM]  1500 Signed out at end of shift pending additional work-up with plan for admission. [LM]    Clinical Course User Index [LM] Tacy Learn, PA-C    Procedures  MDM       Garald Balding, PA-C 03/25/20 1645    Tegeler, Gwenyth Allegra,  MD 03/26/20 [phone]

## 2020-03-25 NOTE — ED Notes (Signed)
Called pts daughter to update of moms transfer to the floor

## 2020-03-25 NOTE — ED Notes (Signed)
Patient being transported to XR/CT at this time.

## 2020-03-25 NOTE — ED Notes (Signed)
Attempted to call report for pt transfer to the floor. No answer at this time will try back

## 2020-03-26 ENCOUNTER — Ambulatory Visit: Payer: Medicare HMO

## 2020-03-26 ENCOUNTER — Inpatient Hospital Stay (HOSPITAL_COMMUNITY): Payer: Medicare HMO

## 2020-03-26 DIAGNOSIS — Z7189 Other specified counseling: Secondary | ICD-10-CM | POA: Diagnosis not present

## 2020-03-26 DIAGNOSIS — R531 Weakness: Secondary | ICD-10-CM | POA: Diagnosis not present

## 2020-03-26 DIAGNOSIS — Z515 Encounter for palliative care: Secondary | ICD-10-CM | POA: Diagnosis not present

## 2020-03-26 DIAGNOSIS — R52 Pain, unspecified: Secondary | ICD-10-CM

## 2020-03-26 DIAGNOSIS — J189 Pneumonia, unspecified organism: Secondary | ICD-10-CM | POA: Diagnosis not present

## 2020-03-26 LAB — CBC
HCT: 30.8 % — ABNORMAL LOW (ref 36.0–46.0)
Hemoglobin: 9.6 g/dL — ABNORMAL LOW (ref 12.0–15.0)
MCH: 24.3 pg — ABNORMAL LOW (ref 26.0–34.0)
MCHC: 31.2 g/dL (ref 30.0–36.0)
MCV: 78 fL — ABNORMAL LOW (ref 80.0–100.0)
Platelets: 99 10*3/uL — ABNORMAL LOW (ref 150–400)
RBC: 3.95 MIL/uL (ref 3.87–5.11)
RDW: 15.3 % (ref 11.5–15.5)
WBC: 10.2 10*3/uL (ref 4.0–10.5)
nRBC: 0 % (ref 0.0–0.2)

## 2020-03-26 LAB — RETICULOCYTES
Immature Retic Fract: 15.1 % (ref 2.3–15.9)
RBC.: 3.87 MIL/uL (ref 3.87–5.11)
Retic Count, Absolute: 63.5 10*3/uL (ref 19.0–186.0)
Retic Ct Pct: 1.6 % (ref 0.4–3.1)

## 2020-03-26 LAB — COMPREHENSIVE METABOLIC PANEL
ALT: 50 U/L — ABNORMAL HIGH (ref 0–44)
AST: 63 U/L — ABNORMAL HIGH (ref 15–41)
Albumin: 2.7 g/dL — ABNORMAL LOW (ref 3.5–5.0)
Alkaline Phosphatase: 66 U/L (ref 38–126)
Anion gap: 11 (ref 5–15)
BUN: 32 mg/dL — ABNORMAL HIGH (ref 8–23)
CO2: 20 mmol/L — ABNORMAL LOW (ref 22–32)
Calcium: 8.4 mg/dL — ABNORMAL LOW (ref 8.9–10.3)
Chloride: 109 mmol/L (ref 98–111)
Creatinine, Ser: 1.33 mg/dL — ABNORMAL HIGH (ref 0.44–1.00)
GFR, Estimated: 42 mL/min — ABNORMAL LOW (ref >60)
Glucose, Bld: 105 mg/dL — ABNORMAL HIGH (ref 70–99)
Potassium: 3.9 mmol/L (ref 3.5–5.1)
Sodium: 140 mmol/L (ref 135–145)
Total Bilirubin: 0.6 mg/dL (ref 0.3–1.2)
Total Protein: 6 g/dL — ABNORMAL LOW (ref 6.5–8.1)

## 2020-03-26 LAB — HEPATITIS PANEL, ACUTE
HCV Ab: REACTIVE — AB
Hep A IgM: NONREACTIVE
Hep B C IgM: NONREACTIVE
Hepatitis B Surface Ag: NONREACTIVE

## 2020-03-26 LAB — PROCALCITONIN: Procalcitonin: 0.57 ng/mL

## 2020-03-26 LAB — PROTIME-INR
INR: 1.6 — ABNORMAL HIGH (ref 0.8–1.2)
Prothrombin Time: 18.8 seconds — ABNORMAL HIGH (ref 11.4–15.2)

## 2020-03-26 LAB — CORTISOL-AM, BLOOD: Cortisol - AM: 25.2 ug/dL — ABNORMAL HIGH (ref 6.7–22.6)

## 2020-03-26 MED ORDER — MONTELUKAST SODIUM 10 MG PO TABS
10.0000 mg | ORAL_TABLET | Freq: Every day | ORAL | Status: DC
  Administered 2020-03-26 – 2020-04-08 (×12): 10 mg via ORAL
  Filled 2020-03-26 (×13): qty 1

## 2020-03-26 MED ORDER — ADULT MULTIVITAMIN W/MINERALS CH
1.0000 | ORAL_TABLET | Freq: Every day | ORAL | Status: DC
  Administered 2020-03-26 – 2020-04-09 (×15): 1 via ORAL
  Filled 2020-03-26 (×15): qty 1

## 2020-03-26 MED ORDER — ROSUVASTATIN CALCIUM 20 MG PO TABS
20.0000 mg | ORAL_TABLET | Freq: Every day | ORAL | Status: DC
  Administered 2020-03-26 – 2020-04-09 (×15): 20 mg via ORAL
  Filled 2020-03-26 (×15): qty 1

## 2020-03-26 MED ORDER — PANTOPRAZOLE SODIUM 40 MG PO TBEC
40.0000 mg | DELAYED_RELEASE_TABLET | Freq: Every day | ORAL | Status: DC
  Administered 2020-03-26 – 2020-04-09 (×15): 40 mg via ORAL
  Filled 2020-03-26 (×15): qty 1

## 2020-03-26 MED ORDER — SODIUM CHLORIDE 0.9 % IV BOLUS: 500.0000 mL | Freq: Once | INTRAVENOUS | Status: AC

## 2020-03-26 MED ORDER — HEPARIN BOLUS VIA INFUSION
4000.0000 [IU] | Freq: Once | INTRAVENOUS | Status: AC
  Administered 2020-03-26: 4000 [IU] via INTRAVENOUS
  Filled 2020-03-26: qty 4000

## 2020-03-26 MED ORDER — PRIMIDONE 50 MG PO TABS
50.0000 mg | ORAL_TABLET | Freq: Three times a day (TID) | ORAL | Status: DC
  Administered 2020-03-26 – 2020-04-09 (×40): 50 mg via ORAL
  Filled 2020-03-26 (×43): qty 1

## 2020-03-26 MED ORDER — BOOST / RESOURCE BREEZE PO LIQD CUSTOM
1.0000 | ORAL | Status: DC
  Administered 2020-03-27 – 2020-04-05 (×10): 1 via ORAL

## 2020-03-26 MED ORDER — ENSURE ENLIVE PO LIQD
237.0000 mL | Freq: Two times a day (BID) | ORAL | Status: DC
  Administered 2020-03-26 – 2020-04-08 (×17): 237 mL via ORAL

## 2020-03-26 MED ORDER — HEPARIN (PORCINE) 25000 UT/250ML-% IV SOLN
1100.0000 [IU]/h | INTRAVENOUS | Status: AC
  Administered 2020-03-26: 1100 [IU]/h via INTRAVENOUS
  Filled 2020-03-26 (×3): qty 250

## 2020-03-26 MED ORDER — PROSOURCE PLUS PO LIQD
30.0000 mL | Freq: Two times a day (BID) | ORAL | Status: DC
  Administered 2020-03-26 – 2020-04-05 (×21): 30 mL via ORAL
  Filled 2020-03-26 (×20): qty 30

## 2020-03-26 NOTE — Progress Notes (Addendum)
PROGRESS NOTE    Amanda Rivers  KVQ:[id number] DOB: 10/30/1945 DOA: 03/25/2020 PCP: Lucianne Lei, MD   Chief Complaint  Patient presents with  . 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 Patient is a poor historian, history obtained on admission from granddaughter.  As per the report she has "gone downhill" since starting radiation.She has become progressively weak and finding it harder to move. She complains of leg weakness and pain. She has had poor appetite that has worsened in the last 3 days (1/2 cup of soup is all she has eaten in 3 days). She is increasingly not seeming herself. She has become more incontinent and somewhat confused. The family has noted some difficulty breathing and cough.They became concerned and decided to bring her to the ED   In the ED,found to have AKI chest x-ray showing pneumonia, started on ceftriaxone Acromycin and there was concern for TB ID was consulted and placed on airborne precaution.  Subjective: Seen and examined this morning patient is alert awake frail, unable to answer many questions but reports she is here because she was not eating well at home. She denies any pain nausea vomiting chest pain fever or chills.  Assessment & Plan:  Left upper lobe pneumonia/sepsis POA (with tachycardia tachypnea leukocytosis, extensive infiltrate left upper lobe) due to pneumonia, rule out tuberculosis w/ afb smear x 3 with reflex culture sensitivities for  r/o TB as per ID recs.  Discussed with Dr. Johnnye Sima.  Cont ceftriaxone/azithromycin. Asked RT to induce sputum if unable to obtain sputum. Recent Labs  Lab 03/25/20 1248 03/25/20 1645 03/25/20 2010 03/25/20 2235 03/26/20 0424  WBC 11.9*  --   --   --  10.2  LATICACIDVEN 3.5* 1.1  1.1 1.0  --   PROCALCITON  --   --   --   --  0.57   AKI Baseline creatinine 1-1.2, 2.2 on admission, improved continue hydration.  Ultrasound without hydronephrosis.  He states patient is medically Recent Labs  Lab 03/25/20 1400 03/26/20 0424  BUN 37* 32*  CREATININE 2.00* 1.33*   Failure to thrive x3 weeks poor oral intake, and has been inactive,/ having leg pain- going downhill since her radiation therapy a month ago.ultrasound abdomen shows gallbladder sludge, no gallstone no hydronephrosis.  Consult dietitian, palliative care. slp also consulted  Confused/acute metabolic encephalopathy and being of forgetful w/ incontinence for few weeks.  Continue full supportive care.  Acute bilateral lower extremity DVT,patient had bilateral leg pain and D-dimer more than 20 which prompted duplex of the leg.  I consulted discussed with her oncologist Dr Doreene Eland to start heparin for this given low platelet count and I had extensive discussion with patient's daughter bleeding risks   As weel and need to need to treat dvt-she wants to proceed with anticoagulation at this time, and will monitor her H&H and platelet count closely.  Hematology consulted.  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 Isidore Moos and Dr. Shanna Cisco radiation 03/05/2020 and going down hill since.  Per oncology Dr. Chryl Heck- patient refused chemo and radiation, and refused further work-up of pulmonary nodules with bronchoscopy. Discussed with oncology, consulted palliative care.  Nutritional assesment dietitian has been consulted to augment diet and for further assessment. Appears frail, thin.  Thrombocytopenia, platelet downtrending> 99k.  Patient will need anticoagulation. Will d/w Hem/Onc. Recent Labs  Lab 03/25/20 1248 03/26/20 0424  PLT 117* 99*   Anemia:Hb down trended from baseline value of 12 g >6 g.  Monitor closely check heme occult and anemia panel. Recent Labs  Lab  03/25/20 1248 03/26/20 0424  HGB 12.0 9.6*  HCT 38.6 30.8*   New onset prediabetes w/ hyperglycemia,hemoglobin A1c 6.3. monitor cbg.  Elevated LFT ultrasound abdomen no acute finding.  Monitor closely.  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 Mild coagulopathy with INR 1.6. Recent Labs  Lab 03/25/20 1248 03/25/20 1400 03/26/20 0424  AST  --  74* 63*  ALT  --  53* 50*  ALKPHOS  --  74 66  BILITOT  --  0.6 0.6  PROT  --  7.1 6.0*  ALBUMIN  --  3.1* 2.7*  INR 1.7*  --  1.6*   GOC:I spoke  W/ daughter in details.  She wishes to keep her full code-and see how she does.  I had a frank discussion that prognosis does not appear bright.  We will continue to offer supportive measures. We will also request palliative care consultation.   Nutrition: Diet Order            Diet Heart Room service appropriate? Yes; Fluid consistency: Thin  Diet effective now               Pt's Body mass index is 20.04 kg/m.  DVT prophylaxis: heparin injection 5,000 Units Start: 03/25/20 2200 Code Status:   Code Status: Full Code  Family Communication: plan of care discussed with patient at bedside, I called her daughter [name] and updated, she wants to keep her full code for now and understands that prognosis is guarded.  Status is: Inpatient Remains inpatient appropriate because:IV treatments appropriate due to intensity of illness or inability to take PO and For ongoing management of DVT, failure to thrive renal failure, pneumonia  Dispo:The patient is from: Home lives alone, daughter lives 2 hrs away.            Anticipated d/c is to: tbd            Anticipated d/c date is: 3 days            Patient currently is not medically stable to d/c.   Difficult to place patient No  Consultants:see note  Procedures:see note  Culture/Microbiology    Component Value Date/Time   SDES URINE, CLEAN CATCH 09/27/2006 1106   SPECREQUEST   IMMUNE:NORM  UT SYMPT:NEG 09/27/2006 1106   CULT ENTEROBACTER AEROGENES 09/27/2006 1106   REPTSTATUS 09/30/2006 FINAL 09/27/2006 1106    Other culture-see note  Medications: Scheduled Meds: . heparin  5,000 Units Subcutaneous Q8H   Continuous Infusions: . sodium chloride 100 mL/hr at 03/26/20 0557  . azithromycin    . cefTRIAXone (ROCEPHIN)  IV      Antimicrobials: Anti-infectives (From admission, onward)   Start     Dose/Rate Route Frequency Ordered Stop   03/26/20 1000  cefTRIAXone (ROCEPHIN) 2 g in sodium chloride 0.9 % 100 mL IVPB        2 g 200 mL/hr over 30 Minutes Intravenous Every 24 hours 03/25/20 1850     03/26/20 1000  azithromycin (ZITHROMAX) 500 mg in sodium chloride 0.9 % 250 mL IVPB        500 mg 250 mL/hr over 60 Minutes Intravenous Every 24 hours  03/25/20 1850     03/25/20 1615  azithromycin (ZITHROMAX) 500 mg in sodium chloride 0.9 % 250 mL IVPB        500 mg 250 mL/hr over 60 Minutes Intravenous  Once 03/25/20 1609 03/25/20 1941   03/25/20 1400  cefTRIAXone (ROCEPHIN) 1 g in sodium chloride 0.9 % 100 mL IVPB        1 g 200 mL/hr over 30 Minutes Intravenous  Once 03/25/20 1355 03/25/20 1636     Objective: Vitals: Today's Vitals   03/25/20 2155 03/25/20 2208 03/25/20 2346 03/26/20 0602  BP: 114/70   (!) 141/68  Pulse: (!) 104   (!) 101  Resp: 15     Temp: 99.1 F (37.3 C)   98.3 F (36.8 C)  TempSrc: Oral   Oral  SpO2: 94%   96%  Weight: 66.1 kg     Height: 5' 11.5" (1.816 m)     PainSc:  5  0-No pain     Intake/Output Summary (Last 24 hours) at 03/26/2020 0952 Last data filed at 03/26/2020 0555 Gross per 24 hour  Intake 2508.41 ml  Output -  Net 2508.41 ml   Filed Weights   03/25/20 1330 03/25/20 2155  Weight: 66.2 kg 66.1 kg   Weight change:   Intake/Output from previous day: 02/03 0701 - 02/04 0700 In: 2508.4 [I.V.:150.3; IV Piggyback:2358.2] Out: -  Intake/Output this shift: No intake/output data recorded. Filed Weights   03/25/20  1330 03/25/20 2155  Weight: 66.2 kg 66.1 kg    Examination: General exam: AAOx1-2,NAD, ill-appearing, frail/thin HEENT:Oral mucosa dry, Ear/Nose WNL grossly,dentition normal. Respiratory system: bilaterally diminished,no wheezing or crackles,no use of accessory muscle, non tender. Cardiovascular system: S1 & S2 +, regular, No JVD. Gastrointestinal system: Abdomen soft, NT,ND, BS+. Nervous System:Alert, awake, moving all her extremities and grossly nonfocal Extremities: mild swelling legs, distal peripheral pulses palpable.  Skin: No rashes,no icterus. MSK: thin muscle bulk,tone, weak  Data Reviewed: I have personally reviewed following labs and imaging studies CBC: Recent Labs  Lab 03/25/20 1248 03/26/20 0424  WBC 11.9* 10.2  NEUTROABS 10.4*  --   HGB 12.0 9.6*  HCT 38.6 30.8*  MCV 78.8* 78.0*  PLT 117* 99*   Basic Metabolic Panel: Recent Labs  Lab 03/25/20 1400 03/26/20 0424  NA 140 140  K 4.4 3.9  CL 104 109  CO2 21* 20*  GLUCOSE 189* 105*  BUN 37* 32*  CREATININE 2.00* 1.33*  CALCIUM 9.1 8.4*   GFR: Estimated Creatinine Clearance: 38.7 mL/min (A) (by C-G formula based on SCr of 1.33 mg/dL (H)). Liver Function Tests: Recent Labs  Lab 03/25/20 1400 03/26/20 0424  AST 74* 63*  ALT 53* 50*  ALKPHOS 74 66  BILITOT 0.6 0.6  PROT 7.1 6.0*  ALBUMIN 3.1* 2.7*   No results for input(s): LIPASE, AMYLASE in the last 168 hours. No results for input(s): AMMONIA in the last 168 hours. Coagulation Profile: Recent Labs  Lab 03/25/20 1248 03/26/20 0424  INR 1.7* 1.6*   Cardiac Enzymes: Recent Labs  Lab 03/25/20 1400  CKTOTAL 40   BNP (last 3 results) No results for input(s): PROBNP in the last 8760 hours. HbA1C: Recent Labs    03/25/20 1248  HGBA1C 6.3*   CBG: No results for input(s): GLUCAP in the last 168 hours. Lipid Profile: No results for input(s): CHOL, HDL, LDLCALC, TRIG, CHOLHDL, LDLDIRECT in the last 72 hours. Thyroid Function Tests: No  results for input(s): TSH, T4TOTAL, FREET4, T3FREE, THYROIDAB in the  last 72 hours. Anemia Panel: No results for input(s): VITAMINB12, FOLATE, FERRITIN, TIBC, IRON, RETICCTPCT in the last 72 hours. Sepsis Labs: Recent Labs  Lab 03/25/20 1248 03/25/20 1645 03/25/20 2010 03/25/20 2235 03/26/20 0424  PROCALCITON  --   --   --   --  0.57  LATICACIDVEN 3.5* 1.1 1.1 1.0  --     Recent Results (from the past 240 hour(s))  SARS Coronavirus 2 by RT PCR (hospital order, performed in [place] hospital lab) Nasopharyngeal Nasopharyngeal Swab     Status: None   Collection Time: 03/25/20  2:17 PM   Specimen: Nasopharyngeal Swab  Result Value Ref Range Status   SARS Coronavirus 2 NEGATIVE NEGATIVE Final    Comment: (NOTE) SARS-CoV-2 target nucleic acids are NOT DETECTED.  The SARS-CoV-2 RNA is generally detectable in upper and lower respiratory specimens during the acute phase of infection. The lowest concentration of SARS-CoV-2 viral copies this assay can detect is 250 copies / mL. A negative result does not preclude SARS-CoV-2 infection and should not be used as the sole basis for treatment or other patient management decisions.  A negative result may occur with improper specimen collection / handling, submission of specimen other than nasopharyngeal swab, presence of viral mutation(s) within the areas targeted by this assay, and inadequate number of viral copies (<250 copies / mL). A negative result must be combined with clinical observations, patient history, and epidemiological information.  Fact Sheet for Patients:   StrictlyIdeas.no  Fact Sheet for Healthcare Providers: BankingDealers.co.za  This test is not yet approved or  cleared by the [place] FDA and has been authorized for detection and/or diagnosis of SARS-CoV-2 by FDA under an Emergency Use Authorization (EUA).  This EUA will remain in effect (meaning this test can be  used) for the duration of the COVID-19 declaration under Section 564(b)(1) of the Act, 21 U.S.C. section 360bbb-3(b)(1), unless the authorization is terminated or revoked sooner.  Performed at [facility], [place] [address], [place], [place] [address]      Radiology Studies: DG Lumbar Spine Complete  Result Date: 03/25/2020 CLINICAL DATA:  Fall. EXAM: LUMBAR SPINE - COMPLETE 4+ VIEW COMPARISON:  None. FINDINGS: Normal alignment. Negative for fracture. Mild disc degeneration. Facet degeneration L4-5 and L5-S1 Atherosclerotic calcification aorta and iliac arteries without aneurysm. IMPRESSION: Negative for fracture Electronically Signed   By: Franchot Gallo M.D.   On: 03/25/2020 15:27   US Abdomen Complete  Result Date: 03/25/2020 CLINICAL DATA:  Acute kidney injury and elevated LFTs. EXAM: ABDOMEN ULTRASOUND COMPLETE COMPARISON:  PET CT 02/04/2020 FINDINGS: Gallbladder: Physiologically distended. Layering sludge. No gallstones or wall thickening visualized. No sonographic Murphy sign noted by sonographer. Common bile duct: Diameter: 3 mm, normal. Liver: No focal lesion identified. Within normal limits in parenchymal echogenicity. Portal vein is patent on color Doppler imaging with normal direction of blood flow towards the liver. IVC: No abnormality visualized. Pancreas: Visualized portion unremarkable. Spleen: Size and appearance within normal limits. Right Kidney: Length: 10.0 cm. Echogenicity within normal limits. No hydronephrosis. 1.5 cm cyst in the lower kidney. No solid mass or visualized renal calculi. Left Kidney: Length: 8.3 cm. Slight increased renal parenchymal echogenicity. No hydronephrosis. 1.3 cm cyst in the medial kidney. No solid lesion or visualized renal calculi. Abdominal aorta: No aneurysm visualized.  Atheromatous plaque. Other findings: No abdominal ascites. There is a left pleural effusion. IMPRESSION: 1. Gallbladder sludge. No gallstones or findings of acute  cholecystitis. 2. No explanation for elevated LFTs.  No biliary dilatation. 3. No hydronephrosis or obstructive uropathy. Mild left renal atrophy and increased echogenicity. Incidental small bilateral renal cysts. 4. Left pleural effusion. Electronically Signed   By: Keith Rake M.D.   On: 03/25/2020 20:02   DG Chest Port 1 View  Result Date: 03/25/2020 CLINICAL DATA:  Fall EXAM: PORTABLE CHEST 1 VIEW COMPARISON:  06/10/2008 FINDINGS: Extensive airspace disease left upper lobe consistent with pneumonia. Underlying emphysema. Blunting right costophrenic angle unchanged compatible with effusion. Right lung otherwise clear Heart size and vascularity normal. Atherosclerotic calcification aortic arch. IMPRESSION: Extensive infiltrate left upper lobe consistent with pneumonia. Possible TB. Small right pleural effusion. Electronically Signed   By: Franchot Gallo M.D.   On: 03/25/2020 15:29   DG Hip Unilat With Pelvis 2-3 Views Left  Result Date: 03/25/2020 CLINICAL DATA:  Fall.  History of lung cancer EXAM: DG HIP (WITH OR WITHOUT PELVIS) 2-3V LEFT COMPARISON:  None. FINDINGS: There is no evidence of hip fracture or dislocation. There is no evidence of arthropathy or other focal bone abnormality. IMPRESSION: Negative. Electronically Signed   By: Franchot Gallo M.D.   On: 03/25/2020 15:26   DG Hip Unilat With Pelvis 2-3 Views Right  Result Date: 03/25/2020 CLINICAL DATA:  Fall.  Hip pain.  Lung cancer history EXAM: DG HIP (WITH OR WITHOUT PELVIS) 2-3V RIGHT COMPARISON:  None. FINDINGS: There is no evidence of hip fracture or dislocation. There is no evidence of arthropathy or other focal bone abnormality. IMPRESSION: Negative. Electronically Signed   By: Franchot Gallo M.D.   On: 03/25/2020 15:26   VAS [place] LOWER EXTREMITY VENOUS (DVT)  Result Date: 03/26/2020  Lower Venous DVT Study Indications: Pain.  Risk Factors: None identified. Comparison Study: No prior studies. Performing Technologist: Oliver Hum  RVT  Examination Guidelines: A complete evaluation includes B-mode imaging, spectral Doppler, color Doppler, and power Doppler as needed of all accessible portions of each vessel. Bilateral testing is considered an integral part of a complete examination. Limited examinations for reoccurring indications may be performed as noted. The reflux portion of the exam is performed with the patient in reverse Trendelenburg.  +---------+---------------+---------+-----------+----------+--------------+ RIGHT    CompressibilityPhasicitySpontaneityPropertiesThrombus Aging +---------+---------------+---------+-----------+----------+--------------+ CFV      None           No       No                   Acute          +---------+---------------+---------+-----------+----------+--------------+ SFJ      Full                                                        +---------+---------------+---------+-----------+----------+--------------+ FV Prox  None           No       No                   Acute          +---------+---------------+---------+-----------+----------+--------------+ FV Mid   None           No       No                   Acute          +---------+---------------+---------+-----------+----------+--------------+ FV DistalNone  No       No                   Acute          +---------+---------------+---------+-----------+----------+--------------+ PFV      None           No       No                   Acute          +---------+---------------+---------+-----------+----------+--------------+ POP      None           No       No                   Acute          +---------+---------------+---------+-----------+----------+--------------+ PTV      None                                         Acute          +---------+---------------+---------+-----------+----------+--------------+ PERO     Partial                                      Acute           +---------+---------------+---------+-----------+----------+--------------+ Gastroc  Full                                                        +---------+---------------+---------+-----------+----------+--------------+ EIV                     Yes      Yes                                 +---------+---------------+---------+-----------+----------+--------------+ CIV                     Yes      Yes                                 +---------+---------------+---------+-----------+----------+--------------+   +---------+---------------+---------+-----------+----------+--------------+ LEFT     CompressibilityPhasicitySpontaneityPropertiesThrombus Aging +---------+---------------+---------+-----------+----------+--------------+ CFV      Full           Yes      Yes                                 +---------+---------------+---------+-----------+----------+--------------+ SFJ      Full                                                        +---------+---------------+---------+-----------+----------+--------------+ FV Prox  Full                                                        +---------+---------------+---------+-----------+----------+--------------+  FV Mid   Full                                                        +---------+---------------+---------+-----------+----------+--------------+ FV DistalFull                                                        +---------+---------------+---------+-----------+----------+--------------+ PFV      Full                                                        +---------+---------------+---------+-----------+----------+--------------+ POP      None           No       No                   Acute          +---------+---------------+---------+-----------+----------+--------------+ PTV      None                                         Acute           +---------+---------------+---------+-----------+----------+--------------+ PERO     Partial                                      Acute          +---------+---------------+---------+-----------+----------+--------------+     Summary: RIGHT: - Findings consistent with acute deep vein thrombosis involving the right common femoral vein, right femoral vein, right proximal profunda vein, right popliteal vein, right posterior tibial veins, and right peroneal veins. - No cystic structure found in the popliteal fossa.  LEFT: - Findings consistent with acute deep vein thrombosis involving the left popliteal vein, left posterior tibial veins, and left peroneal veins. - No cystic structure found in the popliteal fossa.  *See table(s) above for measurements and observations.    Preliminary      LOS: 1 day   Antonieta Pert, MD [facility]  03/26/2020, 9:52 AM

## 2020-03-26 NOTE — TOC Progression Note (Signed)
Transition of Care [facility]) - Progression Note    Patient Details  Name: Amanda Rivers MRN: [id number] Date of Birth: [birth year]  Transition [facility] Care [facility]) CM/SW Contact  Purcell Mouton, RN Phone Number: 03/26/2020, 1:14 PM  Clinical Narrative:    Pt from home with family. TOC will continue to follow.   Expected Discharge Plan: (P) Skilled Nursing Facility Barriers to Discharge: (P) Continued Medical Work up  Expected Discharge Plan and Services Expected Discharge Plan: (P) [place]       Living arrangements for the past 2 months: Single Family Home                                       Social Determinants of Health (SDOH) Interventions    Readmission Risk Interventions No flowsheet data found.

## 2020-03-26 NOTE — Consult Note (Signed)
Consultation Note Date: 03/26/2020   Patient Name: Amanda Rivers  DOB: 1946/01/31  MRN: [id number]  Age / Sex: 75 y.o., female  PCP: Lucianne Lei, MD Referring Physician: Antonieta Pert, MD  Reason for Consultation: Establishing goals of care  HPI/Patient Profile: 75 y.o. 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  Brought into the emergency department by family because of failure to thrive going on for 2-3 weeks, markedly diminished oral intake and a functional decline as well as incontinence.  Work-up in the emergency department showed acute kidney injury as well as pneumonia.  Patient remains admitted to hospital medicine service.  Further work-up showed DVT and talks are being held regarding appropriate anticoagulation.  Occult oncology has also been consulted.  Patient was seeing radiation oncology in the outpatient setting.  Palliative consultation has been requested for CODE STATUS and goals of care discussions.  Patient appears with generalized weakness.  She follows commands.  She is not in any acute distress.  Call placed and discussed with patient's daughter Amanda Rivers at [phone].  I introduced myself and palliative care as follows:  Palliative medicine is specialized medical care for people living with serious illness. It focuses on providing relief from the symptoms and stress of a serious illness. The goal is to improve quality of life for both the patient and the family.  Goals of care: Broad aims of medical therapy in relation to the patient's values and preferences. Our aim is to provide medical care aimed at enabling patients to achieve the goals that matter most to them, given the circumstances of their particular medical situation and their constraints.   Brief  life review performed.  Patient lives by herself, patient's daughter and granddaughter live 2 hours away near [place], [place].  They check on the patient as frequently as possible.  Patient also has a niece who lives locally and several other family members that check on her.  Patient has had a functional decline over the course of the past week or 2.  She has also had marked decline in her oral intake.  I discussed with the patient's daughter about current situation.  Patient remains admitted to hospital medicine service and has been found to have acute kidney injury, infection such as pneumonia as well as DVT.  Discussed about treatment for these conditions.  Additionally, I shared with the patient's daughter my concerns about the patient having marked decline from a cancer standpoint.  She has recurrent maxillary cancer.  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  Patient's daughter becomes tearful and endorses full code/full scope.  She states she is an only child and does not have siblings.  She states she cannot imagine life without her mother.  She describes her mother as a Nurse, adult and states that she would at present like to do everything to save her.  There are no formal advanced directives paperwork completed.  Offered active listening and supportive presence.  See additional discussion below.  NEXT OF KIN Daughter and granddaughter.  SUMMARY OF RECOMMENDATIONS   Full code/full scope care Goals of care discussions with the patient's daughter on the phone.  She desires full code/full scope care for now.  She recognizes that the patient has a serious life limiting condition.  At present, she wishes to see how the patient does with treatment for acute conditions namely treatment of infection as well as treatment for blood clots.  Palliative to follow over the weekend.   Thank you for the consult. Code Status/Advance Care Planning:  Full code    Symptom  Management:      Palliative Prophylaxis:   Delirium Protocol  Additional Recommendations (Limitations, Scope, Preferences):  Full Scope Treatment  Psycho-social/Spiritual:   Desire for further Chaplaincy support:yes  Additional Recommendations: Caregiving  Support/Resources  Prognosis:   Unable to determine  Discharge Planning: To Be Determined      Primary Diagnoses: Present on Admission: . Pneumonia   I have reviewed the medical record, interviewed the patient and family, and examined the patient. The following aspects are pertinent.  Past Medical History:  Diagnosis Date  . HLD (hyperlipidemia)   . Hypertension   . Malignant neoplasm of bronchus and lung, unspecified site   . Reflux    Social History   Socioeconomic History  . Marital status: Divorced    Spouse name: Not on file  . Number of children: Not on file  . Years of education: Not on file  . Highest education level: Not on file  Occupational History  . Not on file  Tobacco Use  . Smoking status: Former Smoker    Quit date: 09/25/2006    Years since quitting: 13.5  . Smokeless tobacco: Never Used  Vaping Use  . Vaping Use: Never used  Substance and Sexual Activity  . Alcohol use: Not Currently    Comment: occasional beer  . Drug use: Never  . Sexual activity: Not Currently  Other Topics Concern  . Not on file  Social History Narrative  . Not on file   Social Determinants of Health   Financial Resource Strain: Not on file  Food Insecurity: No Food Insecurity  . Worried About Charity fundraiser in the Last Year: Never true  . Ran Out of Food in the Last Year: Never true  Transportation Needs: No Transportation Needs  . Lack of Transportation (Medical): No  . Lack of Transportation (Non-Medical): No  Physical Activity: Not on file  Stress: Not on file  Social Connections: Not on file   Family History  Problem Relation Age of Onset  . Brain cancer Paternal Uncle   . Brain cancer  Paternal Uncle    Scheduled Meds: . montelukast  10 mg Oral QHS  . pantoprazole  40 mg Oral Daily  . primidone  50 mg Oral TID  . rosuvastatin  20 mg Oral Daily   Continuous Infusions: . sodium chloride 100 mL/hr at 03/26/20 1318  . azithromycin 500 mg (03/26/20 1003)  . cefTRIAXone (ROCEPHIN)  IV 2 g (03/26/20 1156)  . heparin 1,100 Units/hr (03/26/20 1309)  . sodium chloride     PRN Meds:.acetaminophen **OR** acetaminophen, HYDROcodone-acetaminophen Medications Prior to Admission:  Prior to Admission medications   Medication Sig Start Date End Date Taking? Authorizing Provider  amLODipine-olmesartan (AZOR) 10-40 MG tablet Take 1 tablet by mouth daily.   Yes [provider]  dexlansoprazole (DEXILANT) 60 MG capsule  Take 60 mg by mouth daily.   Yes [provider]  montelukast (SINGULAIR) 10 MG tablet Take 10 mg by mouth at bedtime.   Yes [provider]  nebivolol (BYSTOLIC) 10 MG tablet Take 10 mg by mouth daily.   Yes [provider]  primidone (MYSOLINE) 50 MG tablet Take 50 mg by mouth 3 (three) times daily. 12/15/19 12/14/20 Yes [provider]  rosuvastatin (CRESTOR) 20 MG tablet Take 20 mg by mouth daily. 10/27/19  Yes [provider]  spironolactone (ALDACTONE) 25 MG tablet Take 25 mg by mouth 2 (two) times daily.   Yes [provider]   No Known Allergies Review of Systems Denies pain  Physical Exam Patient is awake alert Appears chronically ill Appears with frailty Diminished breath sounds S1-S2 Generalized weakness  Vital Signs: BP (!) 91/55 (BP Location: Right Arm)   Pulse (!) 101   Temp (!) 100.4 F (38 C) (Oral)   Resp 20   Ht 5' 11.5" (1.816 m)   Wt 66.1 kg   SpO2 95%   BMI 20.04 kg/m  Pain Scale: Faces POSS *See Group Information*: 1-Acceptable,Awake and alert Pain Score: 0-No pain   SpO2: SpO2: 95 % O2 Device:SpO2: 95 % O2 Flow Rate: .   IO: Intake/output summary:   Intake/Output  Summary (Last 24 hours) at 03/26/2020 1450 Last data filed at 03/26/2020 1005 Gross per 24 hour  Intake 2508.41 ml  Output --  Net 2508.41 ml    LBM: Last BM Date: 03/25/20 Baseline Weight: Weight: 66.2 kg Most recent weight: Weight: 66.1 kg     Palliative Assessment/Data:   PPS 40%  Time In:  12 Time Out:  1300 Time Total:  60  Greater than 50%  of this time was spent counseling and coordinating care related to the above assessment and plan.  Signed by: Loistine Chance, MD   Please contact Palliative Medicine Team phone at [phone] for questions and concerns.  For individual provider: See Shea Evans

## 2020-03-26 NOTE — Progress Notes (Signed)
Bilateral lower extremity venous duplex has been completed. Preliminary results can be found in CV Proc through chart review.  Results were given to the patient's nurse, Megan.  03/26/20 9:33 AM Carlos Levering RVT

## 2020-03-26 NOTE — Progress Notes (Signed)
Initial Nutrition Assessment  RD working remotely.  DOCUMENTATION CODES:   Not applicable  INTERVENTION:  - if patient remains Full Code, recommend PEG placement and initiation of TF d/t cancer location - while awaiting PEG placement, recommend small bore NGT tube placement for TF. - TF recommendation: Osmolite 1.5 @ 25 ml/hr to advance by 10 ml every 12 hours to reach goal rate of 55 ml/hr with 45 ml Prosource TF BID.  - at goal rate, this regimen will provide 2060 kcal, 105 grams protein, and 1006 ml free water.  - will liberalize diet from Heart Healthy to Regular. - will order Boost Breeze once/day, each supplement provides 250 kcal and 9 grams of protein. - will order Ensure Enlive BID, each supplement provides 350 kcal and 20 grams of protein. - will order Magic Cup BID with meals, each supplement provides 290 kcal and 9 grams of protein - will Will order 30 ml Prosource Plus BID, each supplement provides 100 kcal and 15 grams protein.  - will order 1 tablet multivitamin with minerals/day.    NUTRITION DIAGNOSIS:   Increased nutrient needs related to acute illness,catabolic illness,cancer and cancer related treatments as evidenced by estimated needs.  GOAL:   Patient will meet greater than or equal to 90% of their needs  MONITOR:   PO intake,Supplement acceptance,Labs,Weight trends  REASON FOR ASSESSMENT:   Malnutrition Screening Tool  ASSESSMENT:   75 year old female with medical hx of HTN, HLD, history of oral cancer on XRT and had her last treatment almost 3 weeks ago. She presented to the ED with 3 weeks of FTT, eating and drinking poorly (1/2 cup soup was all she ate in 3 days PTA), and incontinent. Patient is a poor historian and information was obtained from her granddaughter who reported that patient has been becoming progressively weak and having more difficulty moving around. Granddaughter also reported that patient complained for leg weakness and leg pain.  Family noted some difficulty breathing and cough. In the ED, she was found to have AKI and CXR concerning for PNA. There is also concern for TB; rule out pending.  Patient is noted to be a/o to self only so did not enter patient's room. Rule out for TB pending.  No PO intakes documented since admission. She has not been seen by a [place] RD at any time in the past.    Weight yesterday was 146 lb and weight has been stable over the past month. Prior to that time frame, the most recently documented weight was on 05/07/19 when she weighed 160 lb. This indicates 14 lb weight loss (8.7% body weight) in the past 11 months.   Palliative Care note from this afternoon indicates plan for continued Full Code status after discussion with patient's daughter.   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  Medications reviewed. IVF; NS @ 100 ml/hr.     NUTRITION - FOCUSED PHYSICAL EXAM:  unable to complete at this time.   Diet Order:   Diet Order            Diet Heart Room service appropriate? Yes; Fluid consistency: Thin  Diet effective now                 EDUCATION NEEDS:  Not appropriate for education at this time  Skin:  Skin Assessment: Reviewed RN Assessment  Last BM:  2/3  Height:   Ht Readings from Last 1 Encounters:  03/25/20 5' 11.5" (1.816 m)    Weight:   Wt Readings from Last 1 Encounters:  03/25/20 66.1 kg    Estimated Nutritional Needs:  Kcal:  2000-2200 kcal Protein:  100-115 grams Fluid:  >/= 2.3 L/day      Jarome Matin, MS, RD, LDN, CNSC Inpatient Clinical Dietitian RD pager # available in [facility]  After hours/weekend pager # available in [facility]

## 2020-03-26 NOTE — Progress Notes (Signed)
Brief Oncology Note:  Ms. Amanda Rivers was seen in consultation in the medical oncology clinic by Dr. Chryl Heck for recurrent maxillary cancer. She was seen on 02/17/2020. There was concern for metastatic disease. She was found to have pulmonary nodules, but the patient declined bronchoscopy for further evaluation. Chemotherapy with weekly Cisplatin was discussed, but the patient declined chemotherapy. She received radiation and looks like last treatment was on 03/05/2020. According to phone notes, she did not come to her remaining radiation appointments. Palliative care was consulted and saw her in her home on 03/25/2020. Family requested transfer to the hospital.   She is now admitted for FTT, LUL pneumonia, AKI. She was found to have DVTs in the right and left lower extremities. Hospitalist asked for recommendation for anticoagulation. CBC reviewed. Platelets 99K this am. No need to hold anticoagulation unless platelets drop below 50K or active bleeding.  CBC    Component Value Date/Time   WBC 10.2 03/26/2020 0424   RBC 3.95 03/26/2020 0424   HGB 9.6 (L) 03/26/2020 0424   HCT 30.8 (L) 03/26/2020 0424   PLT 99 (L) 03/26/2020 0424   MCV 78.0 (L) 03/26/2020 0424   MCH 24.3 (L) 03/26/2020 0424   MCHC 31.2 03/26/2020 0424   RDW 15.3 03/26/2020 0424   LYMPHSABS 0.5 (L) 03/25/2020 1248   MONOABS 0.9 03/25/2020 1248   EOSABS 0.0 03/25/2020 1248   BASOSABS 0.0 03/25/2020 1248    Discussed with Dr. Chryl Heck and OK to use anticoagulation from our standpoint. Recommend discussing with patient and family to see if she wants to take anticoagulation and review risks/benefits. The patient can follow up with her PCP for anticoagulation management if she opts to take anticoagulation. We would also recommend Palliative Care consult to clarify goals of care with the patient given that she appears to have metastatic disease but has declined chemotherapy.

## 2020-03-26 NOTE — Evaluation (Signed)
Clinical/Bedside Swallow Evaluation Patient Details  Name: Amanda Rivers MRN: [id number] Date of Birth: 1945/08/13  Today's Date: 03/26/2020 Time: SLP Start Time (ACUTE ONLY): 1500 SLP Stop Time (ACUTE ONLY): 1530 SLP Time Calculation (min) (ACUTE ONLY): 30 min  Past Medical History:  Past Medical History:  Diagnosis Date  . HLD (hyperlipidemia)   . Hypertension   . Malignant neoplasm of bronchus and lung, unspecified site   . Reflux    Past Surgical History:  Past Surgical History:  Procedure Laterality Date  . MAXILLECTOMY  06/05/2019   [facility]  . R Vats R upper lobectomy with node dissection  06/21/06  . SELECTIVE NECK DISSECTION  12/09/2019   [facility]   HPI:  75 y.o. female with medical history significant of HLD, HTN. Presenting with leg pain. Patient is poor historian. History from granddaughter. 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 She had her last dose almost 3 weeks ago. Her granddaughter reports that she has "gone downhill" since starting radiation. She has become progressively weak and finding it harder to move. She complains of leg weakness and pain. She has had poor appetite that has worsened in the last 3 days (1/2 cup of soup is all she has eaten in 3 days). She is increasingly not seeming herself. She has become more incontinent and somewhat confused. The family has noted some difficulty breathing and cough. 03/25/20 CXR indicated Extensive infiltrate left upper lobe consistent with pneumonia. Possible TB;  Small right pleural effusion; CT chest pending.  Assessment / Plan / Recommendation Clinical Impression  Pt seen for clinical swallowing evaluation with oropharyngeal dysphagia noted characterized by oral holding and delayed cough but only noted once during intake with larger volumes requiring multiple swallows.  Recommend using swallowing precautions of small sips/bites and downgrading diet to Dysphagia 2/thin to assist with mastication as pt  has minimal natural dentition.  ST will f/u for diet tolerance and education re: swallowing safety/efficiency and maximizing nutrition/hydration with diet modification.  Thank you for this consult. SLP Visit Diagnosis: Dysphagia, oropharyngeal phase (R13.12)    Aspiration Risk  Mild aspiration risk;Risk for inadequate nutrition/hydration    Diet Recommendation     Medication Administration: Whole meds with puree    Other  Recommendations Oral Care Recommendations: Oral care BID   Follow up Recommendations Other (comment) (TBD)      Frequency and Duration min 2x/week  1 week       Prognosis Prognosis for Safe Diet Advancement: Good      Swallow Study   General Date of Onset: 03/25/20 HPI: 75 y.o. female with medical history significant of HLD, HTN. Presenting with leg pain. Patient is poor historian. History from granddaughter. 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 She had her last dose almost 3 weeks ago. Her granddaughter reports that she has "gone downhill" since starting radiation. She has become progressively weak and finding it harder to move. She complains of leg weakness and pain. She has had poor appetite that has worsened in the last 3 days (1/2 cup of soup is all she has eaten in 3 days). She is increasingly not seeming herself. She has become more incontinent and somewhat confused. The family has noted some difficulty breathing and cough. Type of Study: Bedside Swallow Evaluation Previous Swallow Assessment: n/a Diet Prior to this Study: Regular;Thin liquids Temperature Spikes Noted: Yes Respiratory Status: Room air History of Recent Intubation: No Behavior/Cognition: Alert;Cooperative;Confused Oral Cavity Assessment: Within Functional Limits Oral  Care Completed by SLP: Recent completion by staff Oral Cavity - Dentition: Poor condition;Missing dentition Vision: Functional for self-feeding Self-Feeding Abilities: Able to feed  self;Needs assist Patient Positioning: Upright in chair Baseline Vocal Quality: Low vocal intensity Volitional Cough: Strong Volitional Swallow: Able to elicit    Oral/Motor/Sensory Function Overall Oral Motor/Sensory Function: Generalized oral weakness   Ice Chips Ice chips: Not tested Other Comments: Pt has current fever; deferred   Thin Liquid Thin Liquid: Impaired Presentation: Cup;Straw Oral Phase Functional Implications: Oral holding Pharyngeal  Phase Impairments: Multiple swallows;Cough - Immediate Other Comments: with larger boluses; oral holding noted prior to swallow    Nectar Thick Nectar Thick Liquid: Not tested   Honey Thick Honey Thick Liquid: Not tested   Puree Puree: Impaired Presentation: Spoon Oral Phase Functional Implications: Oral holding   Solid     Solid: Impaired Presentation: Spoon Oral Phase Impairments: Impaired mastication Oral Phase Functional Implications: Impaired mastication;Prolonged oral transit      Elvina Sidle, M.S., CCC-SLP 03/26/2020,4:47 PM

## 2020-03-26 NOTE — Progress Notes (Signed)
ANTICOAGULATION CONSULT NOTE -  Consult  Pharmacy Consult for IV heparin Indication: DVT  No Known Allergies  Patient Measurements: Height: 5' 11.5" (181.6 cm) Weight: 66.1 kg (145 lb 11.6 oz) IBW/kg (Calculated) : 71.95 Heparin Dosing Weight:   Vital Signs: Temp: 99.6 F (37.6 C) (02/04 1008) Temp Source: Oral (02/04 1008) BP: 113/69 (02/04 1008) Pulse Rate: 103 (02/04 1008)  Labs: Recent Labs    03/25/20 1248 03/25/20 1400 03/26/20 0424  HGB 12.0  --  9.6*  HCT 38.6  --  30.8*  PLT 117*  --  99*  APTT 36  --   --   LABPROT 19.3*  --  18.8*  INR 1.7*  --  1.6*  CREATININE  --  2.00* 1.33*  CKTOTAL  --  40  --     Estimated Creatinine Clearance: 38.7 mL/min (A) (by C-G formula based on SCr of 1.33 mg/dL (H)).   Medications:  Scheduled:  . montelukast  10 mg Oral QHS  . pantoprazole  40 mg Oral Daily  . primidone  50 mg Oral TID  . rosuvastatin  20 mg Oral Daily    Assessment: Pharmacy is consulted to dose IV heparin for bilateral DVTs. No noted anticoagulation PTA   Today, 03/26/20   Hgb 9.6, plt 99   Scr 1.33 mg/dl, CrCl 39 ml/min    Goal of Therapy:  Heparin level 0.3-0.7 units/ml Monitor platelets by anticoagulation protocol: Yes   Plan:   Heparin 4000 unit IV bolus followed by heparin 1100 units/hr  Daily CBC  HL 8 hours after start of infusion  Monitor for signs and symptoms of bleeding   Royetta Asal, PharmD, BCPS 03/26/2020 2:09 PM

## 2020-03-26 NOTE — Progress Notes (Signed)
Manufacturing engineer [facility]) Community Based Palliative Care  This patient has been referred to our palliative care services in the community. [facility] will continue to follow for any discharge planning needs and to coordinate admission onto palliative care.  Thank you for the opportunity to participate in this patient's care.  Venia Carbon RN, BSN, [facility] Liaison

## 2020-03-27 ENCOUNTER — Other Ambulatory Visit: Payer: Self-pay

## 2020-03-27 ENCOUNTER — Inpatient Hospital Stay (HOSPITAL_COMMUNITY): Payer: Medicare HMO

## 2020-03-27 DIAGNOSIS — I959 Hypotension, unspecified: Secondary | ICD-10-CM | POA: Diagnosis not present

## 2020-03-27 DIAGNOSIS — J189 Pneumonia, unspecified organism: Secondary | ICD-10-CM

## 2020-03-27 DIAGNOSIS — C760 Malignant neoplasm of head, face and neck: Secondary | ICD-10-CM

## 2020-03-27 DIAGNOSIS — Z7189 Other specified counseling: Secondary | ICD-10-CM | POA: Diagnosis not present

## 2020-03-27 DIAGNOSIS — R531 Weakness: Secondary | ICD-10-CM | POA: Diagnosis not present

## 2020-03-27 DIAGNOSIS — Z515 Encounter for palliative care: Secondary | ICD-10-CM

## 2020-03-27 LAB — BLOOD GAS, ARTERIAL
Acid-base deficit: 5.6 mmol/L — ABNORMAL HIGH (ref 0.0–2.0)
Bicarbonate: 18.9 mmol/L — ABNORMAL LOW (ref 20.0–28.0)
Drawn by: 25788
FIO2: 21
O2 Saturation: 73.1 %
Patient temperature: 98.6
pCO2 arterial: 35.1 mmHg (ref 32.0–48.0)
pH, Arterial: 7.35 (ref 7.350–7.450)
pO2, Arterial: 41.3 mmHg — ABNORMAL LOW (ref 83.0–108.0)

## 2020-03-27 LAB — COMPREHENSIVE METABOLIC PANEL
ALT: 52 U/L — ABNORMAL HIGH (ref 0–44)
AST: 69 U/L — ABNORMAL HIGH (ref 15–41)
Albumin: 2.5 g/dL — ABNORMAL LOW (ref 3.5–5.0)
Alkaline Phosphatase: 73 U/L (ref 38–126)
Anion gap: 7 (ref 5–15)
BUN: 22 mg/dL (ref 8–23)
CO2: 19 mmol/L — ABNORMAL LOW (ref 22–32)
Calcium: 7.9 mg/dL — ABNORMAL LOW (ref 8.9–10.3)
Chloride: 108 mmol/L (ref 98–111)
Creatinine, Ser: 1 mg/dL (ref 0.44–1.00)
GFR, Estimated: 59 mL/min — ABNORMAL LOW (ref >60)
Glucose, Bld: 106 mg/dL — ABNORMAL HIGH (ref 70–99)
Potassium: 3.7 mmol/L (ref 3.5–5.1)
Sodium: 134 mmol/L — ABNORMAL LOW (ref 135–145)
Total Bilirubin: 0.3 mg/dL (ref 0.3–1.2)
Total Protein: 5.8 g/dL — ABNORMAL LOW (ref 6.5–8.1)

## 2020-03-27 LAB — CBC
HCT: 29.3 % — ABNORMAL LOW (ref 36.0–46.0)
Hemoglobin: 9.2 g/dL — ABNORMAL LOW (ref 12.0–15.0)
MCH: 24.5 pg — ABNORMAL LOW (ref 26.0–34.0)
MCHC: 31.4 g/dL (ref 30.0–36.0)
MCV: 78.1 fL — ABNORMAL LOW (ref 80.0–100.0)
Platelets: 145 10*3/uL — ABNORMAL LOW (ref 150–400)
RBC: 3.75 MIL/uL — ABNORMAL LOW (ref 3.87–5.11)
RDW: 15.3 % (ref 11.5–15.5)
WBC: 9.5 10*3/uL (ref 4.0–10.5)
nRBC: 0 % (ref 0.0–0.2)

## 2020-03-27 LAB — URINE CULTURE

## 2020-03-27 LAB — HEPARIN LEVEL (UNFRACTIONATED)
Heparin Unfractionated: <0.1 IU/mL — ABNORMAL LOW (ref 0.30–0.70)
Heparin Unfractionated: 0.65 IU/mL (ref 0.30–0.70)
Heparin Unfractionated: 0.57 IU/mL (ref 0.30–0.70)

## 2020-03-27 LAB — IRON AND TIBC
Iron: 15 ug/dL — ABNORMAL LOW (ref 28–170)
Saturation Ratios: 10 % — ABNORMAL LOW (ref 10.4–31.8)
TIBC: 150 ug/dL — ABNORMAL LOW (ref 250–450)
UIBC: 135 ug/dL

## 2020-03-27 LAB — VITAMIN B12: Vitamin B-12: 5690 pg/mL — ABNORMAL HIGH (ref 180–914)

## 2020-03-27 LAB — LACTIC ACID, PLASMA
Lactic Acid, Venous: 1.2 mmol/L (ref 0.5–1.9)
Lactic Acid, Venous: 1.3 mmol/L (ref 0.5–1.9)

## 2020-03-27 LAB — FOLATE: Folate: 9 ng/mL (ref >5.9)

## 2020-03-27 LAB — FERRITIN: Ferritin: 345 ng/mL — ABNORMAL HIGH (ref 11–307)

## 2020-03-27 MED ORDER — FUROSEMIDE 10 MG/ML IJ SOLN
20.0000 mg | Freq: Once | INTRAMUSCULAR | Status: AC
  Administered 2020-03-27: 20 mg via INTRAVENOUS
  Filled 2020-03-27: qty 2

## 2020-03-27 MED ORDER — SODIUM CHLORIDE 0.9 % IV BOLUS: 500.0000 mL | Freq: Once | INTRAVENOUS | Status: DC

## 2020-03-27 MED ORDER — SODIUM CHLORIDE 0.9 % IV SOLN: INTRAVENOUS | Status: DC

## 2020-03-27 MED ORDER — SODIUM CHLORIDE 0.9 % IV BOLUS
1000.0000 mL | Freq: Once | INTRAVENOUS | Status: AC
  Administered 2020-03-27: 1000 mL via INTRAVENOUS

## 2020-03-27 MED ORDER — DEXAMETHASONE SODIUM PHOSPHATE 4 MG/ML IJ SOLN
4.0000 mg | Freq: Every day | INTRAMUSCULAR | Status: DC
  Administered 2020-03-27 – 2020-03-30 (×4): 4 mg via INTRAVENOUS
  Filled 2020-03-27 (×4): qty 1

## 2020-03-27 NOTE — Progress Notes (Signed)
Called Pt daughter [name] and [name], both spoke with pt via [facility] using writer's personal device. Family and pt were all appreciative of the time. Will cont to monitor. SRP, RN

## 2020-03-27 NOTE — Progress Notes (Signed)
ANTICOAGULATION CONSULT NOTE -  Consult  Pharmacy Consult for IV heparin Indication: DVT  No Known Allergies  Patient Measurements: Height: 5' 11.5" (181.6 cm) Weight: 66.1 kg (145 lb 11.6 oz) IBW/kg (Calculated) : 71.95 Heparin Dosing Weight: TBW  Vital Signs: Temp: 99.3 F (37.4 C) (02/05 0551) Temp Source: Oral (02/05 0551) BP: 123/81 (02/05 0551) Pulse Rate: 115 (02/05 0551)  Labs: Recent Labs    03/25/20 1248 03/25/20 1400 03/26/20 0424 03/26/20 2058 03/27/20 0520  HGB 12.0  --  9.6*  --  9.2*  HCT 38.6  --  30.8*  --  29.3*  PLT 117*  --  99*  --  145*  APTT 36  --   --   --   --   LABPROT 19.3*  --  18.8*  --   --   INR 1.7*  --  1.6*  --   --   HEPARINUNFRC  --   --   --  <0.10* 0.65  CREATININE  --  2.00* 1.33*  --  1.00  CKTOTAL  --  40  --   --   --     Estimated Creatinine Clearance: 51.5 mL/min (by C-G formula based on SCr of 1 mg/dL).   Medications:  Scheduled:  . (feeding supplement) PROSource Plus  30 mL Oral BID BM  . feeding supplement  1 Container Oral Q24H  . feeding supplement  237 mL Oral BID BM  . montelukast  10 mg Oral QHS  . multivitamin with minerals  1 tablet Oral Daily  . pantoprazole  40 mg Oral Daily  . primidone  50 mg Oral TID  . rosuvastatin  20 mg Oral Daily    Assessment: Pharmacy is consulted to dose IV heparin for bilateral DVTs. No noted anticoagulation PTA   Today, 03/27/20   Heparin level 0.65 - therapeutic on 1100 units/hr  CBC: Hg 9.2, pltc 145- low/stable  No bleeding or infusion related issues per RN  Goal of Therapy:  Heparin level 0.3-0.7 units/ml Monitor platelets by anticoagulation protocol: Yes   Plan:   Continue heparin infusion at 1100 units/hr  Daily CBC  Confirmatory HL in 8hrs  Monitor for signs and symptoms of bleeding  [name], PharmD, BCPS 03/27/2020 6:27 AM

## 2020-03-27 NOTE — Progress Notes (Signed)
Patient seen and examined multiple times this afternoon Earlier she had rapid response with tachycardia tachypnea/labored breathing, fever episode- ABG chest x-ray done given low-dose Lasix 20 mg, Tylenol Patient was feeling better she denied chest pain. EKG shows sinus tachycardia ABG with acute hypoxic respiratory failure PO2 41. And subsequently patient had hypotension 30-10 systolic, gave 404 mill bolus blood pressure was improved to 106/73, heart rate subsided in 102, tachypnea improved She is much more alert awake. In between these episodes I had called and discussed with palliative care, consulted pulmonologist Dr. Valeta Harms. Of note -she was seen by palliative care on 2/3 that is the first visit w/ palliative care-where patient remained full code and sent to the hospital for further care for deconditioning, failure to thrive, poor intake and leg pain for few wks following last XRT.  Daughter adamant on keeping full code for now. And I had frank discussion about patient's overall clinical situation. Palliative care is also attempting to talk to the daughter-as per palliative patient's daughter wants to FaceTime with the patient regarding code status discussion- RN arranging one. Continue to monitor closely, continue full scope of treatment, patient is much more alert awake on 2 L nasal cannula, she didn't have any chest pain and blood pressure improving, will check serial lactic acid.

## 2020-03-27 NOTE — Progress Notes (Signed)
°   03/27/20 1637 03/27/20 1638  Vitals  Temp  --  99 F (37.2 C)  Temp Source  --  Oral  BP (!) 78/49 (!) 82/56  MAP (mmHg)  --  65  BP Method  --  Automatic  Pulse Rate  --  (!) 110  Resp  --  (!) 22   MD updated and orders received and completed. SRP,RN

## 2020-03-27 NOTE — Progress Notes (Addendum)
Daughter request to be called for any immediate change during the night, will pass on in handoff with night RN. SRP, RN

## 2020-03-27 NOTE — Progress Notes (Signed)
ANTICOAGULATION CONSULT NOTE -  Consult  Pharmacy Consult for IV heparin Indication: DVT  No Known Allergies  Patient Measurements: Height: 5' 11.5" (181.6 cm) Weight: 66.1 kg (145 lb 11.6 oz) IBW/kg (Calculated) : 71.95 Heparin Dosing Weight: TBW  Vital Signs: Temp: 100.6 F (38.1 C) (02/05 1446) Temp Source: Oral (02/05 1446) BP: 130/99 (02/05 1446) Pulse Rate: 124 (02/05 1446)  Labs: Recent Labs    03/25/20 1248 03/25/20 1400 03/26/20 0424 03/26/20 2058 03/27/20 0520 03/27/20 1251  HGB 12.0  --  9.6*  --  9.2*  --   HCT 38.6  --  30.8*  --  29.3*  --   PLT 117*  --  99*  --  145*  --   APTT 36  --   --   --   --   --   LABPROT 19.3*  --  18.8*  --   --   --   INR 1.7*  --  1.6*  --   --   --   HEPARINUNFRC  --   --   --  <0.10* 0.65 0.57  CREATININE  --  2.00* 1.33*  --  1.00  --   CKTOTAL  --  40  --   --   --   --     Estimated Creatinine Clearance: 51.5 mL/min (by C-G formula based on SCr of 1 mg/dL).   Medications:  Scheduled:  . (feeding supplement) PROSource Plus  30 mL Oral BID BM  . feeding supplement  1 Container Oral Q24H  . feeding supplement  237 mL Oral BID BM  . montelukast  10 mg Oral QHS  . multivitamin with minerals  1 tablet Oral Daily  . pantoprazole  40 mg Oral Daily  . primidone  50 mg Oral TID  . rosuvastatin  20 mg Oral Daily    Assessment: Pharmacy is consulted to dose IV heparin for bilateral DVTs. No noted anticoagulation PTA   Today, 03/27/20   Heparin level 0.57 - therapeutic on 1100 units/hr  CBC: Hg 9.2, pltc 145- low/stable  No bleeding or infusion related issues per RN  Goal of Therapy:  Heparin level 0.3-0.7 units/ml Monitor platelets by anticoagulation protocol: Yes   Plan:   Continue heparin infusion at 1100 units/hr  Daily CBC  Daily HL   Monitor for signs and symptoms of bleeding   Royetta Asal, PharmD, BCPS 03/27/2020 2:54 PM

## 2020-03-27 NOTE — Progress Notes (Signed)
PCCM Goals of Care Discussion and Advanced Care Planning:   Date: 03/27/2020   Present Parties: Patient, patient's daughter via phone  What was discussed:   I called and spoke with the patient's daughter.  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  Patient has refused biopsy in the past.  Patient has refused additional surgeries or interventions.  I discussed her mother's wishes.  Confirmed these with the patient however the patient is somewhat confused at this time  Outcome:   DNR If she continues to decline clinically we will transition to comfort care I have spoke with Dr. Rowe Pavy from palliative care who agrees with this plan. We will try to facilitate a FaceTime visit with patient's daughter and patient.  20 mins of time was spent discussing the goals of care, advanced care planning options such as code status as well as do not resuscitate forms. [phone])

## 2020-03-27 NOTE — Progress Notes (Signed)
Daily Progress Note   Patient Name: Amanda Rivers       Date: 03/27/2020 DOB: [birth year]  Age: 75 y.o. MRN#: [id number] Attending Physician: Amanda Pert, MD Primary Care Physician: Amanda Lei, MD Admit Date: 03/25/2020  Reason for Consultation/Follow-up: Establishing goals of care  Subjective:  resting in bed, had rapid response earlier this afternoon, discussed with PCCM Dr Amanda Rivers, discussed with daughter Amanda Rivers on the phone, discussed with [facility] MD Dr Amanda Rivers, see below.    Length of Stay: 2  Current Medications: Scheduled Meds:  . (feeding supplement) PROSource Plus  30 mL Oral BID BM  . feeding supplement  1 Container Oral Q24H  . feeding supplement  237 mL Oral BID BM  . montelukast  10 mg Oral QHS  . multivitamin with minerals  1 tablet Oral Daily  . pantoprazole  40 mg Oral Daily  . primidone  50 mg Oral TID  . rosuvastatin  20 mg Oral Daily    Continuous Infusions: . azithromycin 500 mg (03/27/20 1034)  . cefTRIAXone (ROCEPHIN)  IV 2 g (03/27/20 1139)  . heparin 1,100 Units/hr (03/27/20 1032)    PRN Meds: acetaminophen **OR** acetaminophen, HYDROcodone-acetaminophen  Physical Exam         Awake alert noted to be resting in bed at the moment Does not appear to be in distress.  Vital Signs: BP 106/73   Pulse (!) 102   Temp 99.3 Rivers (37.4 C) (Oral)   Resp (!) 21   Ht 5' 11.5" (1.816 m)   Wt 66.1 kg   SpO2 99%   BMI 20.04 kg/m  SpO2: SpO2: 99 % O2 Device: O2 Device: Nasal Cannula O2 Flow Rate: O2 Flow Rate (L/min): 2 L/min  Intake/output summary:   Intake/Output Summary (Last 24 hours) at 03/27/2020 1806 Last data filed at 03/27/2020 1300 Gross per 24 hour  Intake 913.29 ml  Output --  Net 913.29 ml   LBM: Last BM Date: 03/25/20 Baseline Weight: Weight: 66.2  kg Most recent weight: Weight: 66.1 kg       Palliative Assessment/Data:      Patient Active Problem List   Diagnosis Date Noted  . Pneumonia 03/25/2020  . 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 Tremor 06/02/2019  . HTN (hypertension)  06/02/2019  . HLD (hyperlipidemia) 06/02/2019  . History of lung cancer 06/02/2019  . Chronic hyponatremia 06/02/2019  . GERD (gastroesophageal reflux disease) 06/02/2019  . 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  Brought into the emergency department by family because of failure to thrive going on for 2-3 weeks, markedly diminished oral intake and a functional decline as well as incontinence.  Work-up in the emergency department showed acute kidney injury as well as pneumonia.  Patient remains admitted to hospital medicine service.  Further work-up showed DVT and talks are being held regarding appropriate anticoagulation.  Occult oncology has also been consulted.  Patient was seeing radiation oncology in the outpatient setting.  Palliative consultation has been requested for CODE STATUS and goals of care discussions. Assessment: Patient had a rapid response called this afternoon because of tachycardia tachypnea she also had a fever.  Patient became hypotensive.  She was given IV fluid resuscitation and serial blood work as well as lactic acid levels are being checked.    Recommendations/Plan: Goals of care and CODE STATUS discussions with the patient's daughter Amanda Rivers over the phone:  We discussed about the patient's current condition.  Patient having hemodynamic instability this afternoon in spite of being on appropriate medical treatments.  Active issues in this hospitalization pertain to concern for pneumonia as well as acute bilateral  DVTs for which she is undergoing anticoagulation as well as appropriate antibiotic therapy.  Patient's baseline is such that she was having marked functional decline and was not eating well since the last 2-1/2 weeks.  She has a serious life limiting illness of metastatic head and neck cancer.  Hence, her risk for ongoing decline decompensation and even death is unfortunately very high.  At end-of-life, she will not benefit from undergoing the full scope of her resuscitative attempt, measures such as CPR, intubation and mechanical ventilation and other interventions, in my opinion, might cause more harm than benefit, might not even work, or might simply be futile while increasing suffering at end-of-life.  Patient's daughter becomes tearful and states that she would like to see the patient via FaceTime.  Bedside RN to coordinate.  Subsequently, all of the above was also discussed separately by Dr. Valeta Rivers from critical care with the patient's daughter.  Subsequently, the patient's daughter has made the decision to accept the medical recommendation for DNR/DNI.  She lives about 2 hours away near [place], [place].  She requests to be called with any changes in the patient's clinical status so that she will be able to come visit with the patient.  Patient to continue current mode of care, palliative medicine team to follow along, monitor hospital course and overall disease trajectory of illness so as to be able to guide appropriate next steps in goals of care decisions as well as appropriate disposition planning.  Code Status:    Code Status Orders  (From admission, onward)         Start     Ordered   03/27/20 1800  Do not attempt resuscitation (DNR)  Continuous       Question Answer Comment  In the event of cardiac or respiratory ARREST Do not call a "code blue"   In the event of cardiac or respiratory ARREST Do not perform Intubation, CPR, defibrillation or ACLS   In the event of cardiac or  respiratory ARREST Use medication by  any route, position, wound care, and other measures to relive pain and suffering. May use oxygen, suction and manual treatment of airway obstruction as needed for comfort.      03/27/20 1759        Code Status History    Date Active Date Inactive Code Status Order ID Comments User Context   03/25/2020 [id number] 03/27/2020 1759 Full Code [id number]  Amanda Finner, DO ED   Advance Care Planning Activity      Prognosis: Guarded, continue to monitor hospital course and overall disease trajectory.  Discharge Planning: To Be Determined  Care plan was discussed with patient, patient's daughter on the phone, RN Sophia, PCCM and [facility] physicians.  Thank you for allowing the Palliative Medicine Team to assist in the care of this patient.   Time In: 1730 Time Out: 1805 Total Time 35 Prolonged Time Billed No       Greater than 50%  of this time was spent counseling and coordinating care related to the above assessment and plan.  Loistine Chance, MD  Please contact Palliative Medicine Team phone at [phone] for questions and concerns.

## 2020-03-27 NOTE — Progress Notes (Signed)
Brief Pharmacy Note re: IV Heparin  Heparin stopped from [id number].  Heparin level <0.1 drawn ~9pm while heparin was stopped.   Continue heparin at 1100 units/hr.  Recheck level 8h after heparin resumed ~5am.   [name], PharmD, BCPS 03/27/2020@2 :11 AM

## 2020-03-27 NOTE — Progress Notes (Signed)
Spoke with daughter [name] and provided Nursing update, left message for MD to follow up with daughter. SRP, RN

## 2020-03-27 NOTE — Progress Notes (Signed)
PPE discontinued per MDs Maren Beach and Icard. SRP, RN

## 2020-03-27 NOTE — Progress Notes (Signed)
Called to pt room, stating, "I am having trouble breathing, not breathing so good. I feel short of breathe". Pt diaphoretic and rubbing chest. VS elevated, CMT called and stated pt had runs of wide QRS. MD notified, CN updated and RR-RN called to assess pt. EKG completed, lasix administered, Chest xray and ABG ordered. Pt given tylenol for elevated temp. IVF stopped pt noted with wheezes, O2 placed at 2 liters for support. Will continue to monitor. SRP, RN

## 2020-03-27 NOTE — Consult Note (Addendum)
NAME:  Amanda Rivers, MRN:  [id number], DOB:  02-19-1946, LOS: 2 ADMISSION DATE:  03/25/2020, CONSULTATION DATE: 03/27/2020 REFERRING MD: Dr. 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  Hypotensive on the floor.  Pulmonary critical care called for consultation.  Of note this is one of my outpatient clinic patients.  History of Present Illness:   This is a 75 year old female, past medical history of hypertension head and neck cancer.  Patient is followed by Dr. Isidore Moos and Dr. Chryl Heck who last office visit was 02/17/2020.  This was reviewed in the office today.  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  Pathology revealed 2 out of 23 lymph nodes positive for squamous cell carcinoma.  Current planned appointments for biopsy or potentially disrupted due to some scheduling issues and patient desire.  Now scheduled to see me today to discuss biopsy for determination of metastatic disease to the lung.    Surgical hx: Dr. Owens Shark, Patient has also had a left infrastructure maxillectomy palatectomy, left selective neck dissection left temporalis myelogenous flap for palate reconstruction on 06/05/2019 for a tumor size of 4.2 cm grade 2 moderately differentiated squamous cell.  Ultimately in September 2021 had right sided nodal mass confirming squamous cell.  She met discussed on 02/17/2020 with her medical oncologist for potential biopsy of the lung to confirm metastatic disease to the chest.  They discussed this in this office visit.  OV 03/02/2020: Here today to discuss bronchoscopy and tissue sampling of the abnormalities found on pet imaging.  Patient's pet imaging was completed on  02/04/2020: This revealed several hypermetabolic pulmonary lesions concerning for metastatic disease.  She has a 12.5 x 10 mm left apical nodule SUV max 3.49, 2 ill-defined subsolid nodules within the left upper lobe both with SUV greater than 4 and a 3.5 x 2.5 lesion within the left upper lobe SUV max 7.1 which was the largest of these there is no significant hypermetabolic mediastinal or hilar adenopathy.  03/27/2020: Consultation in hospital for hypertension.  Patient admitted with left upper lobe concern of pneumonia.  This is most likely lymphangitic spread and/or pneumonitis secondary to radiation treatments that were caught in the head neck field.  She has failure to thrive she is not been eating or drinking for several days now.  Hypotensive on the floor which I suspect is from hypovolemic state.  I called and spoke with the patient's daughter via phone had a clear goals of care discussion and explained that we would not be transferring the patient to the intensive care unit for aggressive care with a known history of advanced stage cancer that is not curable.  Patient's daughter agrees with this plan.  Well-documented in the chart along with conversations with palliative care team  Past Medical History:   Past Medical History:  Diagnosis Date  . HLD (hyperlipidemia)   . Hypertension   . Malignant neoplasm of bronchus and lung, unspecified site   . Reflux      Significant Hospital Events:    Consults:  Palliative care Pulmonary critical care  Procedures:    Significant Diagnostic Tests:    Micro Data:   Antimicrobials:     Interim History / Subjective:    Objective   Blood pressure 106/73, pulse Marland Kitchen)  102, temperature 99.3 F (37.4 C), temperature source Oral, resp. rate (!) 21, height 5' 11.5" (1.816 m), weight 66.1 kg, SpO2 99 %.        Intake/Output Summary (Last 24 hours) at 03/27/2020 1803 Last data filed at 03/27/2020 1300 Gross per 24 hour  Intake 913.29 ml   Output --  Net 913.29 ml   Filed Weights   03/25/20 1330 03/25/20 2155  Weight: 66.2 kg 66.1 kg    Examination: General: Cachectic frail debilitated female lying in bed HENT: Left facial deformity from surgical intervention on head neck cancer Lungs: Crackles in the left compared to the right, no wheeze Cardiovascular: Tachycardic, S1-S2 Abdomen: Soft nontender nondistended Extremities: Thin cachectic muscle wasting present Neuro: Alert following commands, knows where she is and who she is but is still confused with some short-term memory issues about recently speaking with family GU: Deferred  Resolved Hospital Problem list     Assessment & Plan:   This is a 75 year old female with an 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  She has had multiple bouts of radiation multiple surgeries. She has a left upper lobe PET avid mass that was initially seen in December.  She has declined work-up and further biopsies for confirmation of metastatic head neck cancer. She has underwent palliative radiation with Dr. Isidore Moos. 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 Possible pneumonia I do not think that she has TB. No need for airborne precautions. 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 Her overall prognosis is extremely poor. Patient has declined additional intervention and additional procedures.  There is documentation throughout the medical record of this. I had spoke with patient's primary oncologist and primary radiation oncologist just this past week about her case.  Their recommendations were referral for outpatient hospice. 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 From her very poor p.o. intake and continued failure to thrive at home I suspect she is still hypovolemic. Encourage p.o.  intake Continue IV fluid bolus. We will not have any additional aggressive measures after discussions with patient's daughter. No intubation no vasopressors. If patient declines we will recommend transition of care to comfort measures. Continue heparin Decadron 4 mg daily  CODE STATUS: DNR Orders placed please see separate goals of care discussion documentation.  Labs   CBC: Recent Labs  Lab 03/25/20 1248 03/26/20 0424 03/27/20 0520  WBC 11.9* 10.2 9.5  NEUTROABS 10.4*  --   --   HGB 12.0 9.6* 9.2*  HCT 38.6 30.8* 29.3*  MCV 78.8* 78.0* 78.1*  PLT 117* 99* 145*    Basic Metabolic Panel: Recent Labs  Lab 03/25/20 1400 03/26/20 0424 03/27/20 0520  NA 140 140 134*  K 4.4 3.9 3.7  CL 104 109 108  CO2 21* 20* 19*  GLUCOSE 189* 105* 106*  BUN 37* 32* 22  CREATININE 2.00* 1.33* 1.00  CALCIUM 9.1 8.4* 7.9*   GFR: Estimated Creatinine Clearance: 51.5 mL/min (by C-G formula based on SCr of 1 mg/dL). Recent Labs  Lab 03/25/20 1248 03/25/20 1645 03/25/20 2010 03/25/20 2235 03/26/20 0424 03/27/20 0520  PROCALCITON  --   --   --   --  0.57  --   WBC 11.9*  --   --   --  10.2 9.5  LATICACIDVEN 3.5* 1.1 1.1 1.0  --   --  Liver Function Tests: Recent Labs  Lab 03/25/20 1400 03/26/20 0424 03/27/20 0520  AST 74* 63* 69*  ALT 53* 50* 52*  ALKPHOS 74 66 73  BILITOT 0.6 0.6 0.3  PROT 7.1 6.0* 5.8*  ALBUMIN 3.1* 2.7* 2.5*   No results for input(s): LIPASE, AMYLASE in the last 168 hours. No results for input(s): AMMONIA in the last 168 hours.  ABG    Component Value Date/Time   PHART 7.350 03/27/2020 1500   PCO2ART 35.1 03/27/2020 1500   PO2ART 41.3 (L) 03/27/2020 1500   HCO3 18.9 (L) 03/27/2020 1500   TCO2 25 05/08/2019 0006   ACIDBASEDEF 5.6 (H) 03/27/2020 1500   O2SAT 73.1 03/27/2020 1500     Coagulation Profile: Recent Labs  Lab 03/25/20 1248 03/26/20 0424  INR 1.7* 1.6*    Cardiac Enzymes: Recent Labs  Lab 03/25/20 1400  CKTOTAL 40     HbA1C: Hgb A1c MFr Bld  Date/Time Value Ref Range Status  03/25/2020 12:48 PM 6.3 (H) 4.8 - 5.6 % Final    Comment:    (NOTE) Pre diabetes:          5.7%-6.4%  Diabetes:              >6.4%  Glycemic control for   <7.0% adults with diabetes     CBG: No results for input(s): GLUCAP in the last 168 hours.  Review of Systems:   Unable to be obtained Patient confused hypotensive.  Past Medical History:  She,  has a past medical history of HLD (hyperlipidemia), Hypertension, Malignant neoplasm of bronchus and lung, unspecified site, and Reflux.   Surgical History:   Past Surgical History:  Procedure Laterality Date  . MAXILLECTOMY  06/05/2019   [facility]  . R Vats R upper lobectomy with node dissection  06/21/06  . SELECTIVE NECK DISSECTION  12/09/2019   [facility]     Social History:   reports that she quit smoking about 13 years ago. She has never used smokeless tobacco. She reports previous alcohol use. She reports that she does not use drugs.   Family History:  Her family history includes Brain cancer in her paternal uncle and paternal uncle.   Allergies No Known Allergies   Home Medications  Prior to Admission medications   Medication Sig Start Date End Date Taking? Authorizing Provider  amLODipine-olmesartan (AZOR) 10-40 MG tablet Take 1 tablet by mouth daily.   Yes [provider]  dexlansoprazole (DEXILANT) 60 MG capsule Take 60 mg by mouth daily.   Yes [provider]  montelukast (SINGULAIR) 10 MG tablet Take 10 mg by mouth at bedtime.   Yes [provider]  nebivolol (BYSTOLIC) 10 MG tablet Take 10 mg by mouth daily.   Yes [provider]  primidone (MYSOLINE) 50 MG tablet Take 50 mg by mouth 3 (three) times daily. 12/15/19 12/14/20 Yes [provider]  rosuvastatin (CRESTOR) 20 MG tablet Take 20 mg by mouth daily. 10/27/19  Yes [provider]  spironolactone (ALDACTONE) 25 MG tablet Take 25 mg by mouth 2 (two)  times daily.   Yes [provider]     This patient is critically ill with multiple organ system failure; which, requires frequent high complexity decision making, assessment, support, evaluation, and titration of therapies. This was completed through the application of advanced monitoring technologies and extensive interpretation of multiple databases. During this encounter critical care time was devoted to patient care services described in this note for 32 minutes.  Octavio Graves  Valeta Harms DO [place] [facility] 03/27/2020 6:27 PM

## 2020-03-27 NOTE — Significant Event (Signed)
Rapid Response Event Note   Reason for Call :  RN reports patient diaphoretic complaining of chest pain and being unable to breath.  Initial Focused Assessment:  Pt resting in bed. Skin dry. Slightly increased WOB with respirations mid 20s. Pt states "my breathing's not so good". States she didn't have chest pain and denies any pain currently. Temp 100.6 BP 130/99 (110) HR ST 124 O2 100% on RA Respirations 26     Interventions:  EKG obtained- ST 120s CXR ordered, Maintenance fluids discontinued, 20mg  lasix given. RN to give oral tylenol to treat temp. MD notified- ABG ordered.  Plan of Care:  Pt states she feels better. Remains tachypnic with slight increase in WOB. RN to treat temp and carry out additional orders given by MD and reevaluate. RN to call rapid response if any concerns arise or it patient deteriorates.    Event Summary:   MD Notified: Lupita Leash, MD Call Time: 6301 Arrival Time: 6010 End Time: [place], RN

## 2020-03-27 NOTE — Progress Notes (Signed)
PROGRESS NOTE    Amanda Rivers  QBH:[id number] DOB: 1945-04-25 DOA: 03/25/2020 PCP: Lucianne Lei, MD   Chief Complaint  Patient presents with  . 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 Patient is a poor historian, history obtained on admission from granddaughter.  As per the report she has "gone downhill" since starting radiation.She has become progressively weak and finding it harder to move. She complains of leg weakness and pain. She has had poor appetite that has worsened in the last 3 days (1/2 cup of soup is all she has eaten in 3 days). She is increasingly not seeming herself. She has become more incontinent and somewhat confused. The family has noted some difficulty breathing and cough.They became concerned and decided to bring her to the ED   In the ED,found to have AKI chest x-ray showing pneumonia, started on ceftriaxone Azithromycin and there was concern for TB-ID was consulted and placed on airborne precaution, sputum test ordered. Patient duplex came back positive for acute bilateral DVT, placed on heparin infusion  Subjective: Seen this morning patient is much more alert, awake, interactive. No acute events overnight. T-max 100.4 yesterday afternoon. She was saturating well on room air. Hemoglobin is stable platelet uptrending  Assessment & Plan:  Left upper lobe pneumonia/sepsis POA (with tachycardia tachypnea leukocytosis, extensive infiltrate left upper lobe) due to pneumonia, rule out tuberculosis w/ afb smear x 3 with reflex culture sensitivities- I had discussed with Dr. Johnnye Sima.  AFB smear collected x2 already follow-up results. Cont ceftriaxone/azithromycin.  Leukocytosis has resolved, lactic acidosis resolved.  AKI Baseline creatinine  1-1.2, 2.2 on admission, renal failure is stabilized 1.0 with IV fluid.Ultrasound without hydronephrosis.  Cutdown IV fluids Recent Labs  Lab 03/25/20 1400 03/26/20 0424 03/27/20 0520  BUN 37* 32* 22  CREATININE 2.00* 0.97* 3.53   Metabolic acidosis monitor bicarb level, continue hydration.  Failure to thrive x3 weeks poor oral intake, and has been inactive,/ having leg pain- going downhill since her radiation therapy a month ago.ultrasound abdomen shows gallbladder sludge, no gallstone no hydronephrosis.  Dietitian on board speech following continue to augment diet.   Dysphagia oropharyngeal, seen by dietitian.  Tolerating p.o.  Acute metabolic encephalopathy and being of forgetful w/ incontinence for few weeks-mentation appears much improved today.  Continue supportive care acute hydration, encourage p.o.  Acute bilateral lower extremity DVT,patient had bilateral leg pain and D-dimer more than 20 which prompted duplex of the leg. Discussed w/ her oncologist Dr Chryl Heck, and also with patient's daughter-and she would like to proceed with anticoagulation understanding risk benefits.  Patient tolerating heparin, platelet uptrending.  This is likely due to patient's cancer along with her immobility.    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 Isidore Moos and Dr. Shanna Cisco radiation 03/05/2020 and going down hill since.  Per oncology Dr. Chryl Heck- patient refused chemo and radiation, and refused further work-up of pulmonary nodules with bronchoscopy.  Palliative care following closely.  I had extensive discussion with patient's daughter about overall prognosis, and she would like to try everything we can at this time.   Nutritional assesment dietitian following, continue to augment diet.    Thrombocytopenia, platelet appears improved today 145K, tolerating heparin.   Recent Labs  Lab 03/25/20 1248 03/26/20  0424 03/27/20 0520  PLT 117* 99* 145*    Anemia: Slowly downtrending, continue to monitor and transfuse if less than 7 g or symptomatic.  Baseline value of 12 g.  Monitor closely check heme occult-pending, anemia panel showed normal/high ferritin with low serum iron 15 Recent Labs  Lab 03/25/20 1248 03/26/20 0424 03/27/20 0520  HGB 12.0 9.6* 9.2*  HCT 38.6 30.8* 29.3*   New onset prediabetes w/ hyperglycemia,hemoglobin A1c 6.3. monitor cbg.  Elevated LFT ultrasound abdomen no acute finding.  LFTs overall stable slightly abnormal.  Hepatitis C Ab+:HCVRNA pending.  Mild coagulopathy with INR 1.6.  GOC:I spoke  W/ daughter in details. She wishes to keep her full code-and see how she does.  I had a frank discussion that prognosis does not appear bright.  We will continue to offer supportive measures. Palliative care also following closely.   Nutrition: Diet Order            DIET DYS 2 Room service appropriate? Yes; Fluid consistency: Thin  Diet effective now               Pt's Body mass index is 20.04 kg/m.  DVT prophylaxis:  Code Status:   Code Status: Full Code  Family Communication: plan of care discussed with patient at bedside, I had called her daughter [name] and updated, she wants to keep her full code for now and understands that prognosis is guarded. Today I called daughter's cell phone number to update, no answer.  Status is: Inpatient Remains inpatient appropriate because:IV treatments appropriate due to intensity of illness or inability to take PO and For ongoing management of DVT, failure to thrive renal failure, pneumonia  Dispo:The patient is from: Home lives alone, daughter lives 2 hrs away.            Anticipated d/c is to: PT OT eval once stable            Anticipated d/c date is:2- 3 days            Patient currently is not medically stable to d/c.   Difficult to place patient No  Consultants:see note  Procedures:see note  Culture/Microbiology    Component Value Date/Time   SDES  03/26/2020  0742    BLOOD BLOOD LEFT FOREARM Performed at [facility], [place] [address] [place], [place] [address]    SPECREQUEST  03/26/2020 [phone]    BOTTLES DRAWN AEROBIC ONLY Blood Culture results may not be optimal due to an inadequate volume of blood received in culture bottles Performed at [facility], [place] [address] [place], [place] [address]    CULT  03/26/2020 [phone]    NO GROWTH 1 DAY Performed at [place] [address], [place], [place] [address]    REPTSTATUS PENDING 03/26/2020 4970    Other culture-see note  Medications: Scheduled Meds: . (feeding supplement) PROSource Plus  30 mL Oral BID BM  . feeding supplement  1 Container Oral Q24H  . feeding supplement  237 mL Oral BID BM  . montelukast  10 mg Oral QHS  . multivitamin with minerals  1 tablet Oral Daily  . pantoprazole  40 mg Oral Daily  . primidone  50 mg Oral TID  . rosuvastatin  20 mg Oral Daily   Continuous Infusions: . sodium chloride 100 mL/hr at 03/27/20 1030  . azithromycin 500 mg (03/27/20 1034)  . cefTRIAXone (ROCEPHIN)  IV 2 g (03/27/20 1139)  . heparin 1,100 Units/hr (  03/27/20 1032)    Antimicrobials: Anti-infectives (From admission, onward)   Start     Dose/Rate Route Frequency Ordered Stop   03/26/20 1000  cefTRIAXone (ROCEPHIN) 2 g in sodium chloride 0.9 % 100 mL IVPB        2 g 200 mL/hr over 30 Minutes Intravenous Every 24 hours 03/25/20 1850     03/26/20 1000  azithromycin (ZITHROMAX) 500 mg in sodium chloride 0.9 % 250 mL IVPB        500 mg 250 mL/hr over 60 Minutes Intravenous Every 24 hours 03/25/20 1850     03/25/20 1615  azithromycin (ZITHROMAX) 500 mg in sodium chloride 0.9 % 250 mL IVPB        500 mg 250 mL/hr over 60 Minutes Intravenous  Once 03/25/20 1609 03/25/20 1941   03/25/20 1400  cefTRIAXone (ROCEPHIN) 1 g in sodium chloride 0.9 % 100 mL IVPB        1 g 200 mL/hr over 30 Minutes Intravenous  Once 03/25/20 1355 03/25/20 1636      Objective: Vitals: Today's Vitals   03/26/20 2135 03/27/20 0212 03/27/20 0551 03/27/20 1214  BP: 132/81 (!) 142/87 123/81 90/62  Pulse: (!) 101 (!) 110 (!) 115 (!) 107  Resp:    (!) 22  Temp: 99.1 F (37.3 C) 99.8 F (37.7 C) 99.3 F (37.4 C) 99.8 F (37.7 C)  TempSrc: Oral Oral Oral Oral  SpO2: 95% 92% 93% 96%  Weight:      Height:      PainSc: 0-No pain       Intake/Output Summary (Last 24 hours) at 03/27/2020 1334 Last data filed at 03/27/2020 0300 Gross per 24 hour  Intake 2089.94 ml  Output --  Net 2089.94 ml   Filed Weights   03/25/20 1330 03/25/20 2155  Weight: 66.2 kg 66.1 kg   Weight change:   Intake/Output from previous day: 02/04 0701 - 02/05 0700 In: 2089.9 [P.O.:200; I.V.:1539.9; IV Piggyback:350] Out: -  Intake/Output this shift: No intake/output data recorded. Filed Weights   03/25/20 1330 03/25/20 2155  Weight: 66.2 kg 66.1 kg    Examination: General exam: AAOx2, ill looking, frail,older than stated age.  HEENT:Oral mucosa moist, Ear/Nose WNL grossly, dentition normal. Respiratory system: bilaterally clear,no wheezing or crackles,no use of accessory muscle Cardiovascular system: S1 & S2 +, No JVD,. Gastrointestinal system: Abdomen soft, NT,ND, BS+ Nervous System:Alert, awake, moving extremities and grossly nonfocal Extremities: Mild leg edema, distal peripheral pulses palpable.  Skin: No rashes,no icterus. MSK: Normal muscle bulk,tone, power  Data Reviewed: I have personally reviewed following labs and imaging studies CBC: Recent Labs  Lab 03/25/20 1248 03/26/20 0424 03/27/20 0520  WBC 11.9* 10.2 9.5  NEUTROABS 10.4*  --   --   HGB 12.0 9.6* 9.2*  HCT 38.6 30.8* 29.3*  MCV 78.8* 78.0* 78.1*  PLT 117* 99* 093*   Basic Metabolic Panel: Recent Labs  Lab 03/25/20 1400 03/26/20 0424 03/27/20 0520  NA 140 140 134*  K 4.4 3.9 3.7  CL 104 109 108  CO2 21* 20* 19*  GLUCOSE 189* 105* 106*  BUN 37* 32* 22  CREATININE 2.00* 1.33* 1.00   CALCIUM 9.1 8.4* 7.9*   GFR: Estimated Creatinine Clearance: 51.5 mL/min (by C-G formula based on SCr of 1 mg/dL). Liver Function Tests: Recent Labs  Lab 03/25/20 1400 03/26/20 0424 03/27/20 0520  AST 74* 63* 69*  ALT 53* 50* 52*  ALKPHOS 74 66 73  BILITOT 0.6 0.6 0.3  PROT 7.1  6.0* 5.8*  ALBUMIN 3.1* 2.7* 2.5*   No results for input(s): LIPASE, AMYLASE in the last 168 hours. No results for input(s): AMMONIA in the last 168 hours. Coagulation Profile: Recent Labs  Lab 03/25/20 1248 03/26/20 0424  INR 1.7* 1.6*   Cardiac Enzymes: Recent Labs  Lab 03/25/20 1400  CKTOTAL 40   BNP (last 3 results) No results for input(s): PROBNP in the last 8760 hours. HbA1C: Recent Labs    03/25/20 1248  HGBA1C 6.3*   CBG: No results for input(s): GLUCAP in the last 168 hours. Lipid Profile: No results for input(s): CHOL, HDL, LDLCALC, TRIG, CHOLHDL, LDLDIRECT in the last 72 hours. Thyroid Function Tests: No results for input(s): TSH, T4TOTAL, FREET4, T3FREE, THYROIDAB in the last 72 hours. Anemia Panel: Recent Labs    03/26/20 0424 03/27/20 0520  VITAMINB12  --  5,690*  FOLATE  --  9.0  FERRITIN  --  345*  TIBC  --  150*  IRON  --  15*  RETICCTPCT 1.6  --    Sepsis Labs: Recent Labs  Lab 03/25/20 1248 03/25/20 1645 03/25/20 2010 03/25/20 2235 03/26/20 0424  PROCALCITON  --   --   --   --  0.57  LATICACIDVEN 3.5* 1.1 1.1 1.0  --     Recent Results (from the past 240 hour(s))  SARS Coronavirus 2 by RT PCR (hospital order, performed in [place] hospital lab) Nasopharyngeal Nasopharyngeal Swab     Status: None   Collection Time: 03/25/20  2:17 PM   Specimen: Nasopharyngeal Swab  Result Value Ref Range Status   SARS Coronavirus 2 NEGATIVE NEGATIVE Final    Comment: (NOTE) SARS-CoV-2 target nucleic acids are NOT DETECTED.  The SARS-CoV-2 RNA is generally detectable in upper and lower respiratory specimens during the acute phase of infection. The  lowest concentration of SARS-CoV-2 viral copies this assay can detect is 250 copies / mL. A negative result does not preclude SARS-CoV-2 infection and should not be used as the sole basis for treatment or other patient management decisions.  A negative result may occur with improper specimen collection / handling, submission of specimen other than nasopharyngeal swab, presence of viral mutation(s) within the areas targeted by this assay, and inadequate number of viral copies (<250 copies / mL). A negative result must be combined with clinical observations, patient history, and epidemiological information.  Fact Sheet for Patients:   StrictlyIdeas.no  Fact Sheet for Healthcare Providers: BankingDealers.co.za  This test is not yet approved or  cleared by the [place] FDA and has been authorized for detection and/or diagnosis of SARS-CoV-2 by FDA under an Emergency Use Authorization (EUA).  This EUA will remain in effect (meaning this test can be used) for the duration of the COVID-19 declaration under Section 564(b)(1) of the Act, 21 U.S.C. section 360bbb-3(b)(1), unless the authorization is terminated or revoked sooner.  Performed at [facility], [place] [address] [place], [place] [address]   Urine culture     Status: Abnormal   Collection Time: 03/25/20  4:40 PM   Specimen: In/Out Cath Urine  Result Value Ref Range Status   Specimen Description   Final    IN/OUT CATH URINE Performed at [facility] [address] [place], [place] [address]    Special Requests   Final    NONE Performed at [facility], [place] [address] [place], [place] [address]    Culture MULTIPLE SPECIES PRESENT, SUGGEST RECOLLECTION (A)  Final  Report Status 03/27/2020 FINAL  Final  Blood culture (routine single)     Status: None (Preliminary result)   Collection Time: 03/26/20  7:42 AM    Specimen: BLOOD  Result Value Ref Range Status   Specimen Description   Final    BLOOD BLOOD LEFT FOREARM Performed at [place] [address] [place], [place] [address]    Special Requests   Final    BOTTLES DRAWN AEROBIC ONLY Blood Culture results may not be optimal due to an inadequate volume of blood received in culture bottles Performed at [facility] [address], [place], [place] [address]    Culture   Final    NO GROWTH 1 DAY Performed at [facility], [place] [address] [place], [place] [address]    Report Status PENDING  Incomplete     Radiology Studies: DG Lumbar Spine Complete  Result Date: 03/25/2020 CLINICAL DATA:  Fall. EXAM: LUMBAR SPINE - COMPLETE 4+ VIEW COMPARISON:  None. FINDINGS: Normal alignment. Negative for fracture. Mild disc degeneration. Facet degeneration L4-5 and L5-S1 Atherosclerotic calcification aorta and iliac arteries without aneurysm. IMPRESSION: Negative for fracture Electronically Signed   By: Franchot Gallo M.D.   On: 03/25/2020 15:27   US Abdomen Complete  Result Date: 03/25/2020 CLINICAL DATA:  Acute kidney injury and elevated LFTs. EXAM: ABDOMEN ULTRASOUND COMPLETE COMPARISON:  PET CT 02/04/2020 FINDINGS: Gallbladder: Physiologically distended. Layering sludge. No gallstones or wall thickening visualized. No sonographic Murphy sign noted by sonographer. Common bile duct: Diameter: 3 mm, normal. Liver: No focal lesion identified. Within normal limits in parenchymal echogenicity. Portal vein is patent on color Doppler imaging with normal direction of blood flow towards the liver. IVC: No abnormality visualized. Pancreas: Visualized portion unremarkable. Spleen: Size and appearance within normal limits. Right Kidney: Length: 10.0 cm. Echogenicity within normal limits. No hydronephrosis. 1.5 cm cyst in the lower kidney. No solid mass or visualized renal calculi. Left Kidney: Length: 8.3 cm. Slight increased  renal parenchymal echogenicity. No hydronephrosis. 1.3 cm cyst in the medial kidney. No solid lesion or visualized renal calculi. Abdominal aorta: No aneurysm visualized.  Atheromatous plaque. Other findings: No abdominal ascites. There is a left pleural effusion. IMPRESSION: 1. Gallbladder sludge. No gallstones or findings of acute cholecystitis. 2. No explanation for elevated LFTs.  No biliary dilatation. 3. No hydronephrosis or obstructive uropathy. Mild left renal atrophy and increased echogenicity. Incidental small bilateral renal cysts. 4. Left pleural effusion. Electronically Signed   By: Keith Rake M.D.   On: 03/25/2020 20:02   DG Chest Port 1 View  Result Date: 03/25/2020 CLINICAL DATA:  Fall EXAM: PORTABLE CHEST 1 VIEW COMPARISON:  06/10/2008 FINDINGS: Extensive airspace disease left upper lobe consistent with pneumonia. Underlying emphysema. Blunting right costophrenic angle unchanged compatible with effusion. Right lung otherwise clear Heart size and vascularity normal. Atherosclerotic calcification aortic arch. IMPRESSION: Extensive infiltrate left upper lobe consistent with pneumonia. Possible TB. Small right pleural effusion. Electronically Signed   By: Franchot Gallo M.D.   On: 03/25/2020 15:29   DG Hip Unilat With Pelvis 2-3 Views Left  Result Date: 03/25/2020 CLINICAL DATA:  Fall.  History of lung cancer EXAM: DG HIP (WITH OR WITHOUT PELVIS) 2-3V LEFT COMPARISON:  None. FINDINGS: There is no evidence of hip fracture or dislocation. There is no evidence of arthropathy or other focal bone abnormality. IMPRESSION: Negative. Electronically Signed   By: Franchot Gallo M.D.   On: 03/25/2020 15:26   DG Hip Unilat  With Pelvis 2-3 Views Right  Result Date: 03/25/2020 CLINICAL DATA:  Fall.  Hip pain.  Lung cancer history EXAM: DG HIP (WITH OR WITHOUT PELVIS) 2-3V RIGHT COMPARISON:  None. FINDINGS: There is no evidence of hip fracture or dislocation. There is no evidence of arthropathy or other  focal bone abnormality. IMPRESSION: Negative. Electronically Signed   By: Franchot Gallo M.D.   On: 03/25/2020 15:26   VAS [place] LOWER EXTREMITY VENOUS (DVT)  Result Date: 03/26/2020  Lower Venous DVT Study Indications: Pain.  Risk Factors: None identified. Comparison Study: No prior studies. Performing Technologist: Oliver Hum RVT  Examination Guidelines: A complete evaluation includes B-mode imaging, spectral Doppler, color Doppler, and power Doppler as needed of all accessible portions of each vessel. Bilateral testing is considered an integral part of a complete examination. Limited examinations for reoccurring indications may be performed as noted. The reflux portion of the exam is performed with the patient in reverse Trendelenburg.  +---------+---------------+---------+-----------+----------+--------------+ RIGHT    CompressibilityPhasicitySpontaneityPropertiesThrombus Aging +---------+---------------+---------+-----------+----------+--------------+ CFV      None           No       No                   Acute          +---------+---------------+---------+-----------+----------+--------------+ SFJ      Full                                                        +---------+---------------+---------+-----------+----------+--------------+ FV Prox  None           No       No                   Acute          +---------+---------------+---------+-----------+----------+--------------+ FV Mid   None           No       No                   Acute          +---------+---------------+---------+-----------+----------+--------------+ FV DistalNone           No       No                   Acute          +---------+---------------+---------+-----------+----------+--------------+ PFV      None           No       No                   Acute          +---------+---------------+---------+-----------+----------+--------------+ POP      None           No       No                    Acute          +---------+---------------+---------+-----------+----------+--------------+ PTV      None                                         Acute          +---------+---------------+---------+-----------+----------+--------------+  PERO     Partial                                      Acute          +---------+---------------+---------+-----------+----------+--------------+ Gastroc  Full                                                        +---------+---------------+---------+-----------+----------+--------------+ EIV                     Yes      Yes                                 +---------+---------------+---------+-----------+----------+--------------+ CIV                     Yes      Yes                                 +---------+---------------+---------+-----------+----------+--------------+   +---------+---------------+---------+-----------+----------+--------------+ LEFT     CompressibilityPhasicitySpontaneityPropertiesThrombus Aging +---------+---------------+---------+-----------+----------+--------------+ CFV      Full           Yes      Yes                                 +---------+---------------+---------+-----------+----------+--------------+ SFJ      Full                                                        +---------+---------------+---------+-----------+----------+--------------+ FV Prox  Full                                                        +---------+---------------+---------+-----------+----------+--------------+ FV Mid   Full                                                        +---------+---------------+---------+-----------+----------+--------------+ FV DistalFull                                                        +---------+---------------+---------+-----------+----------+--------------+ PFV      Full                                                         +---------+---------------+---------+-----------+----------+--------------+  POP      None           No       No                   Acute          +---------+---------------+---------+-----------+----------+--------------+ PTV      None                                         Acute          +---------+---------------+---------+-----------+----------+--------------+ PERO     Partial                                      Acute          +---------+---------------+---------+-----------+----------+--------------+     Summary: RIGHT: - Findings consistent with acute deep vein thrombosis involving the right common femoral vein, right femoral vein, right proximal profunda vein, right popliteal vein, right posterior tibial veins, and right peroneal veins. - No cystic structure found in the popliteal fossa.  LEFT: - Findings consistent with acute deep vein thrombosis involving the left popliteal vein, left posterior tibial veins, and left peroneal veins. - No cystic structure found in the popliteal fossa.  *See table(s) above for measurements and observations. Electronically signed by Monica Martinez MD on 03/26/2020 at 4:07:53 PM.    Final      LOS: 2 days   Antonieta Pert, MD [facility]  03/27/2020, 1:34 PM

## 2020-03-27 NOTE — Progress Notes (Signed)
   03/27/20 1446  Assess: MEWS Score  Temp (!) 100.6 F (38.1 C)  BP (!) 130/99  Pulse Rate (!) 124  ECG Heart Rate (!) 124  Resp (!) 26  Level of Consciousness Alert  SpO2 100 %  O2 Device Room Air  Assess: MEWS Score  MEWS Temp 1  MEWS Systolic 0  MEWS Pulse 2  MEWS RR 2  MEWS LOC 0  MEWS Score 5  MEWS Score Color Red  Assess: if the MEWS score is Yellow or Red  Were vital signs taken at a resting state? Yes  Focused Assessment No change from prior assessment  Early Detection of Sepsis Score *See Row Information* High  MEWS guidelines implemented *See Row Information* Yes  Treat  Pain Scale 0-10  Pain Score 0  Take Vital Signs  Increase Vital Sign Frequency  Red: Q 1hr X 4 then Q 4hr X 4, if remains red, continue Q 4hrs  Escalate  MEWS: Escalate Red: discuss with charge nurse/RN and provider, consider discussing with RRT  Notify: Charge Nurse/RN  Name of Charge Nurse/RN Notified Jessica, RN  Date Charge Nurse/RN Notified 03/27/20  Time Charge Nurse/RN Notified 1446  Notify: Provider  Provider Name/Title Dr. Antonieta Pert  Date Provider Notified 03/27/20  Time Provider Notified 1446  Notification Type Face-to-face  Notification Reason Change in status  Response See new orders  Date of Provider Response 03/27/20  Time of Provider Response 1500  Notify: Rapid Response  Name of Rapid Response RN Notified Sarah, RN  Date Rapid Response Notified 03/27/20  Time Rapid Response Notified 3606  Document  Patient Outcome Stabilized after interventions

## 2020-03-27 NOTE — Progress Notes (Signed)
Pt alert times 4 , currently in the yellow mews, q4 v/s protocol at this time.

## 2020-03-28 DIAGNOSIS — R7989 Other specified abnormal findings of blood chemistry: Secondary | ICD-10-CM

## 2020-03-28 DIAGNOSIS — Z7189 Other specified counseling: Secondary | ICD-10-CM | POA: Diagnosis not present

## 2020-03-28 DIAGNOSIS — Z515 Encounter for palliative care: Secondary | ICD-10-CM | POA: Diagnosis not present

## 2020-03-28 DIAGNOSIS — N179 Acute kidney failure, unspecified: Secondary | ICD-10-CM | POA: Diagnosis not present

## 2020-03-28 DIAGNOSIS — J189 Pneumonia, unspecified organism: Secondary | ICD-10-CM | POA: Diagnosis not present

## 2020-03-28 DIAGNOSIS — I82402 Acute embolism and thrombosis of unspecified deep veins of left lower extremity: Secondary | ICD-10-CM

## 2020-03-28 DIAGNOSIS — R531 Weakness: Secondary | ICD-10-CM | POA: Diagnosis not present

## 2020-03-28 DIAGNOSIS — R0602 Shortness of breath: Secondary | ICD-10-CM

## 2020-03-28 LAB — CBC
HCT: 28.8 % — ABNORMAL LOW (ref 36.0–46.0)
Hemoglobin: 8.8 g/dL — ABNORMAL LOW (ref 12.0–15.0)
MCH: 24.3 pg — ABNORMAL LOW (ref 26.0–34.0)
MCHC: 30.6 g/dL (ref 30.0–36.0)
MCV: 79.6 fL — ABNORMAL LOW (ref 80.0–100.0)
Platelets: 160 10*3/uL (ref 150–400)
RBC: 3.62 MIL/uL — ABNORMAL LOW (ref 3.87–5.11)
RDW: 15.7 % — ABNORMAL HIGH (ref 11.5–15.5)
WBC: 8 10*3/uL (ref 4.0–10.5)
nRBC: 0 % (ref 0.0–0.2)

## 2020-03-28 LAB — COMPREHENSIVE METABOLIC PANEL
ALT: 45 U/L — ABNORMAL HIGH (ref 0–44)
AST: 52 U/L — ABNORMAL HIGH (ref 15–41)
Albumin: 2.2 g/dL — ABNORMAL LOW (ref 3.5–5.0)
Alkaline Phosphatase: 63 U/L (ref 38–126)
Anion gap: 10 (ref 5–15)
BUN: 18 mg/dL (ref 8–23)
CO2: 19 mmol/L — ABNORMAL LOW (ref 22–32)
Calcium: 7.9 mg/dL — ABNORMAL LOW (ref 8.9–10.3)
Chloride: 111 mmol/L (ref 98–111)
Creatinine, Ser: 1.14 mg/dL — ABNORMAL HIGH (ref 0.44–1.00)
GFR, Estimated: 51 mL/min — ABNORMAL LOW (ref >60)
Glucose, Bld: 215 mg/dL — ABNORMAL HIGH (ref 70–99)
Potassium: 3.4 mmol/L — ABNORMAL LOW (ref 3.5–5.1)
Sodium: 140 mmol/L (ref 135–145)
Total Bilirubin: 0.2 mg/dL — ABNORMAL LOW (ref 0.3–1.2)
Total Protein: 5.3 g/dL — ABNORMAL LOW (ref 6.5–8.1)

## 2020-03-28 LAB — HEPARIN LEVEL (UNFRACTIONATED): Heparin Unfractionated: 0.56 IU/mL (ref 0.30–0.70)

## 2020-03-28 LAB — HCV RNA QUANT: HCV Quantitative: NOT DETECTED IU/mL (ref >50)

## 2020-03-28 MED ORDER — RIVAROXABAN 15 MG PO TABS
15.0000 mg | ORAL_TABLET | Freq: Two times a day (BID) | ORAL | Status: DC
  Administered 2020-03-28 – 2020-04-09 (×25): 15 mg via ORAL
  Filled 2020-03-28 (×25): qty 1

## 2020-03-28 MED ORDER — AMOXICILLIN-POT CLAVULANATE 875-125 MG PO TABS
1.0000 | ORAL_TABLET | Freq: Two times a day (BID) | ORAL | Status: AC
  Administered 2020-03-29 – 2020-03-31 (×6): 1 via ORAL
  Filled 2020-03-28 (×6): qty 1

## 2020-03-28 MED ORDER — AZITHROMYCIN 250 MG PO TABS
500.0000 mg | ORAL_TABLET | Freq: Every day | ORAL | Status: AC
  Administered 2020-03-29 – 2020-03-30 (×2): 500 mg via ORAL
  Filled 2020-03-28 (×2): qty 2

## 2020-03-28 MED ORDER — POTASSIUM CHLORIDE 20 MEQ PO PACK
40.0000 meq | PACK | Freq: Every day | ORAL | Status: DC
  Administered 2020-03-28 – 2020-03-30 (×3): 40 meq via ORAL
  Filled 2020-03-28 (×4): qty 2

## 2020-03-28 MED ORDER — RIVAROXABAN 20 MG PO TABS: 20.0000 mg | ORAL_TABLET | Freq: Every day | ORAL | Status: DC

## 2020-03-28 NOTE — Progress Notes (Signed)
PROGRESS NOTE    Amanda Rivers  FIE:[id number] DOB: Dec 26, 1945 DOA: 03/25/2020 PCP: Lucianne Lei, MD   Chief Complaint  Patient presents with  . 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  Brought in due to failure to thrive.  Patient was found to have pneumonia.  Also found to have acute DVT.  Hospitalized for further management.     Subjective: Patient noted to be awake.  States that she is feeling cold.  Denies any pain issues at this time.  Noted to be distracted and confused at times.     Assessment & Plan:  Left upper lobe pneumonia/sepsis POA/possible radiation pneumonitis Patient presented with tachycardia tachypnea leukocytosis, extensive infiltrate left upper lobe.   Tuberculosis was a consideration.  Discussed with pulmonology consulted on the patient yesterday.  Did not feel that this is mycobacterial infection.  Isolation precautions have been discontinued. Patient remains on ceftriaxone and azithromycin.   WBC noted to be normal.  Consider changing to oral antibiotics. Patient started on steroids by pulmonology due to concern for radiation pneumonitis.  AKI/normal anion gap metabolic acidosis Baseline creatinine 1-1.2.  Presented with creatinine of 2.2.  Was given IV fluids with improvement in renal function.  Monitor urine output. Bicarbonate level is stable.  Failure to thrive x3 weeks Patient has had poor oral intake.  Apparently has been going downhill since she was started on radiation treatment about a month ago.  Abdominal ultrasound was done which showed gallbladder sludge without any gallstones or hydronephrosis.  Continue to encourage oral intake.  PT and OT.     Oropharyngeal dysphagia Aspiration precautions.  Acute metabolic encephalopathy Likely due to metabolic derangements and dehydration.  Seems to be improved.  Continue supportive care.     Acute bilateral lower extremity DVT Patient had bilateral leg pain and D-dimer more than 20 which prompted duplex of the leg. Discussed w/ her oncologist Dr Chryl Heck, and also with patient's daughter. Patient started on IV heparin.  Since renal function has improved and since platelet counts have also stabilized we will switch to oral anticoagulation.  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 Isidore Moos and Dr. Chryl Heck. Last radiation 03/05/2020 and going down hill since.  Per oncology Dr. Chryl Heck- patient refused chemo and radiation, and refused further work-up of pulmonary nodules with bronchoscopy.  Palliative care following closely.    Thrombocytopenia Possibly due to consumption.  Improving.    Anemia of chronic disease Has been downtrending.  However no overt bleeding noted.  Transfuse if it drops below 7.    New onset prediabetes w/ hyperglycemia HbA1c 6.3.  Elevated CBGs could be due to steroids.  Patient noted to be on dexamethasone   Abnormal LFTs Abdominal ultrasound did not show any acute findings.  LFTs stable.  Positive hepatitis C antibody RNA is pending.  Goals of care Palliative care is following.  Patient noted to be DNR/DNI after pulmonology discussed patient's condition with patient's family.  DVT prophylaxis: Currently on IV heparin Code Status: DNR Family Communication: None at bedside Disposition: We will need PT and OT evaluation.  May need placement.  Status is: Inpatient Remains inpatient appropriate because:IV treatments appropriate due to intensity of illness or inability to take PO and For ongoing management of DVT, failure to thrive renal failure, pneumonia  Dispo:The patient is from: Home lives alone, daughter lives 2 hrs away.  Anticipated d/c is to: PT OT eval once stable            Anticipated d/c date is:2- 3 days            Patient currently is not medically stable to d/c.   Difficult to place  patient No  Consultants: Pulmonology Procedures: None yet  Culture/Microbiology    Component Value Date/Time   SDES  03/26/2020 0742    BLOOD BLOOD LEFT FOREARM Performed at [facility], [place] [address], [place], [place] [address]    SPECREQUEST  03/26/2020 [phone]    BOTTLES DRAWN AEROBIC ONLY Blood Culture results may not be optimal due to an inadequate volume of blood received in culture bottles Performed at [facility], [place] [address], [place], [place] [address]    CULT  03/26/2020 [phone]    NO GROWTH 1 DAY Performed at [place] [address], [place], [place] [address]    REPTSTATUS PENDING 03/26/2020 1194    Other culture-see note  Medications: Scheduled Meds: . (feeding supplement) PROSource Plus  30 mL Oral BID BM  . dexamethasone (DECADRON) injection  4 mg Intravenous Daily  . feeding supplement  1 Container Oral Q24H  . feeding supplement  237 mL Oral BID BM  . montelukast  10 mg Oral QHS  . multivitamin with minerals  1 tablet Oral Daily  . pantoprazole  40 mg Oral Daily  . primidone  50 mg Oral TID  . rosuvastatin  20 mg Oral Daily   Continuous Infusions: . azithromycin 500 mg (03/27/20 1034)  . cefTRIAXone (ROCEPHIN)  IV 2 g (03/28/20 0953)  . heparin 1,100 Units/hr (03/27/20 1032)    Antimicrobials: Anti-infectives (From admission, onward)   Start     Dose/Rate Route Frequency Ordered Stop   03/26/20 1000  cefTRIAXone (ROCEPHIN) 2 g in sodium chloride 0.9 % 100 mL IVPB        2 g 200 mL/hr over 30 Minutes Intravenous Every 24 hours 03/25/20 1850     03/26/20 1000  azithromycin (ZITHROMAX) 500 mg in sodium chloride 0.9 % 250 mL IVPB        500 mg 250 mL/hr over 60 Minutes Intravenous Every 24 hours 03/25/20 1850     03/25/20 1615  azithromycin (ZITHROMAX) 500 mg in sodium chloride 0.9 % 250 mL IVPB        500 mg 250 mL/hr over 60 Minutes Intravenous  Once 03/25/20 1609 03/25/20 1941   03/25/20 1400   cefTRIAXone (ROCEPHIN) 1 g in sodium chloride 0.9 % 100 mL IVPB        1 g 200 mL/hr over 30 Minutes Intravenous  Once 03/25/20 1355 03/25/20 1636     Objective: Vitals: Today's Vitals   03/27/20 2300 03/27/20 2336 03/28/20 0015 03/28/20 0500  BP:   118/82 100/84  Pulse:   95 96  Resp:      Temp:   98.9 F (37.2 C) 99.1 F (37.3 C)  TempSrc:   Oral Oral  SpO2:   96% 94%  Weight:      Height:      PainSc: 0-No pain 3       Intake/Output Summary (Last 24 hours) at 03/28/2020 1003 Last data filed at 03/28/2020 0300 Gross per 24 hour  Intake 100 ml  Output 800 ml  Net -700 ml   Filed Weights   03/25/20 1330 03/25/20 2155  Weight: 66.2 kg 66.1 kg   Weight change:  Intake/Output from previous day: 02/05 0701 - 02/06 0700 In: 100 [P.O.:100] Out: 800 [Urine:800] Intake/Output this shift: No intake/output data recorded. Filed Weights   03/25/20 1330 03/25/20 2155  Weight: 66.2 kg 66.1 kg    Examination:  General appearance: Awake alert.  In no distress.  Distracted Resp: Clear to auscultation bilaterally.  Normal effort Cardio: S1-S2 is normal regular.  No S3-S4.  No rubs murmurs or bruit GI: Abdomen is soft.  Nontender nondistended.  Bowel sounds are present normal.  No masses organomegaly Extremities: Able to move all of her extremities without difficulty. Neurologic:   No focal neurological deficits.    Data Reviewed: I have personally reviewed following labs and imaging studies CBC: Recent Labs  Lab 03/25/20 1248 03/26/20 0424 03/27/20 0520 03/28/20 0525  WBC 11.9* 10.2 9.5 8.0  NEUTROABS 10.4*  --   --   --   HGB 12.0 9.6* 9.2* 8.8*  HCT 38.6 30.8* 29.3* 28.8*  MCV 78.8* 78.0* 78.1* 79.6*  PLT 117* 99* 145* 562   Basic Metabolic Panel: Recent Labs  Lab 03/25/20 1400 03/26/20 0424 03/27/20 0520 03/28/20 0525  NA 140 140 134* 140  K 4.4 3.9 3.7 3.4*  CL 104 109 108 111  CO2 21* 20* 19* 19*  GLUCOSE 189* 105* 106* 215*  BUN 37* 32* 22 18   CREATININE 2.00* 1.33* 1.00 1.14*  CALCIUM 9.1 8.4* 7.9* 7.9*   GFR: Estimated Creatinine Clearance: 45.2 mL/min (A) (by C-G formula based on SCr of 1.14 mg/dL (H)).  Liver Function Tests: Recent Labs  Lab 03/25/20 1400 03/26/20 0424 03/27/20 0520 03/28/20 0525  AST 74* 63* 69* 52*  ALT 53* 50* 52* 45*  ALKPHOS 74 66 73 63  BILITOT 0.6 0.6 0.3 0.2*  PROT 7.1 6.0* 5.8* 5.3*  ALBUMIN 3.1* 2.7* 2.5* 2.2*   Coagulation Profile: Recent Labs  Lab 03/25/20 1248 03/26/20 0424  INR 1.7* 1.6*   Cardiac Enzymes: Recent Labs  Lab 03/25/20 1400  CKTOTAL 40   HbA1C: Recent Labs    03/25/20 1248  HGBA1C 6.3*   Anemia Panel: Recent Labs    03/26/20 0424 03/27/20 0520  VITAMINB12  --  5,690*  FOLATE  --  9.0  FERRITIN  --  345*  TIBC  --  150*  IRON  --  15*  RETICCTPCT 1.6  --    Sepsis Labs: Recent Labs  Lab 03/25/20 2010 03/25/20 2235 03/26/20 0424 03/27/20 1800 03/27/20 2006  PROCALCITON  --   --  0.57  --   --   LATICACIDVEN 1.1 1.0  --  1.2 1.3    Recent Results (from the past 240 hour(s))  SARS Coronavirus 2 by RT PCR (hospital order, performed in [place] hospital lab) Nasopharyngeal Nasopharyngeal Swab     Status: None   Collection Time: 03/25/20  2:17 PM   Specimen: Nasopharyngeal Swab  Result Value Ref Range Status   SARS Coronavirus 2 NEGATIVE NEGATIVE Final    Comment: (NOTE) SARS-CoV-2 target nucleic acids are NOT DETECTED.  The SARS-CoV-2 RNA is generally detectable in upper and lower respiratory specimens during the acute phase of infection. The lowest concentration of SARS-CoV-2 viral copies this assay can detect is 250 copies / mL. A negative result does not preclude SARS-CoV-2 infection and should not be used as the sole basis for treatment or other patient management decisions.  A negative result may occur with improper specimen collection / handling, submission of specimen other than nasopharyngeal swab, presence of viral  mutation(s) within the areas targeted by this assay, and inadequate number of viral copies (<250 copies / mL). A negative result must be combined with clinical observations, patient history, and epidemiological information.  Fact Sheet for Patients:   StrictlyIdeas.no  Fact Sheet for Healthcare Providers: BankingDealers.co.za  This test is not yet approved or  cleared by the [place] FDA and has been authorized for detection and/or diagnosis of SARS-CoV-2 by FDA under an Emergency Use Authorization (EUA).  This EUA will remain in effect (meaning this test can be used) for the duration of the COVID-19 declaration under Section 564(b)(1) of the Act, 21 U.S.C. section 360bbb-3(b)(1), unless the authorization is terminated or revoked sooner.  Performed at [facility], [place] [address], [place], [place] [address]   Urine culture     Status: Abnormal   Collection Time: 03/25/20  4:40 PM   Specimen: In/Out Cath Urine  Result Value Ref Range Status   Specimen Description   Final    IN/OUT CATH URINE Performed at [place] [address], [place], [place] [address]    Special Requests   Final    NONE Performed at [facility], [place] [address], [place], [place] [address]    Culture MULTIPLE SPECIES PRESENT, SUGGEST RECOLLECTION (A)  Final   Report Status 03/27/2020 FINAL  Final  Blood culture (routine single)     Status: None (Preliminary result)   Collection Time: 03/26/20  7:42 AM   Specimen: BLOOD  Result Value Ref Range Status   Specimen Description   Final    BLOOD BLOOD LEFT FOREARM Performed at [place] [address] [place], [place] [address]    Special Requests   Final    BOTTLES DRAWN AEROBIC ONLY Blood Culture results may not be optimal due to an inadequate volume of blood received in culture bottles Performed at [place] [address] [place], [place] [address]    Culture   Final    NO GROWTH 1 DAY Performed at [facility], [place] [address], [place],  [address]    Report Status PENDING  Incomplete     Radiology Studies: DG Chest Port 1 View  Result Date: 03/27/2020 CLINICAL DATA:  Chest pain and shortness of breath, diminished breath sounds by report. EXAM: PORTABLE CHEST 1 VIEW COMPARISON:  Multiple prior studies dating back to 2010, most recent March 25, 2020 FINDINGS: Accounting for differences in technique no interval change in the appearance of interstitial and airspace disease in the LEFT upper chest compared to the recent comparison study. Signs of emphysema in the background as before. Pleural and parenchymal scarring at the RIGHT lung base unchanged. Cardiomediastinal contours and hilar structures are stable with partially obscured LEFT hilar structures due to interstitial and airspace opacities. On limited assessment no acute skeletal process. IMPRESSION: No interval change in the appearance of the chest compared to the recent comparison study. Signs of emphysema with basilar scarring RIGHT greater than LEFT. Findings may represent infection as discussed previously. Note that this patient also has a history of neoplasm. Lymphangitic carcinomatosis is another differential consideration. Would also correlate with whether there has been radiation therapy to this area that could also be a cause of pneumonitis. CT of the chest may be helpful for further evaluation. Electronically Signed   By: Zetta Bills M.D.   On: 03/27/2020 15:24     LOS: 3 days   Bonnielee Haff, MD [facility]  03/28/2020, 10:03 AM

## 2020-03-28 NOTE — Progress Notes (Signed)
ANTICOAGULATION CONSULT NOTE -  Consult  Pharmacy Consult for IV heparin Indication: DVT  No Known Allergies  Patient Measurements: Height: 5' 11.5" (181.6 cm) Weight: 66.1 kg (145 lb 11.6 oz) IBW/kg (Calculated) : 71.95 Heparin Dosing Weight: TBW  Vital Signs: Temp: 99.1 F (37.3 C) (02/06 0500) Temp Source: Oral (02/06 0500) BP: 100/84 (02/06 0500) Pulse Rate: 96 (02/06 0500)  Labs: Recent Labs    03/25/20 1248 03/25/20 1400 03/26/20 0424 03/26/20 2058 03/27/20 0520 03/27/20 1251 03/28/20 0525  HGB 12.0  --  9.6*  --  9.2*  --  8.8*  HCT 38.6  --  30.8*  --  29.3*  --  28.8*  PLT 117*  --  99*  --  145*  --  160  APTT 36  --   --   --   --   --   --   LABPROT 19.3*  --  18.8*  --   --   --   --   INR 1.7*  --  1.6*  --   --   --   --   HEPARINUNFRC  --   --   --    < > 0.65 0.57 0.56  CREATININE  --  2.00* 1.33*  --  1.00  --   --   CKTOTAL  --  40  --   --   --   --   --    < > = values in this interval not displayed.    Estimated Creatinine Clearance: 51.5 mL/min (by C-G formula based on SCr of 1 mg/dL).   Medications:  Scheduled:  . (feeding supplement) PROSource Plus  30 mL Oral BID BM  . dexamethasone (DECADRON) injection  4 mg Intravenous Daily  . feeding supplement  1 Container Oral Q24H  . feeding supplement  237 mL Oral BID BM  . montelukast  10 mg Oral QHS  . multivitamin with minerals  1 tablet Oral Daily  . pantoprazole  40 mg Oral Daily  . primidone  50 mg Oral TID  . rosuvastatin  20 mg Oral Daily    Assessment: Pharmacy is consulted to dose IV heparin for bilateral DVTs. No noted anticoagulation PTA   Today, 03/28/20   Heparin level 0.56 - therapeutic on 1100 units/hr  CBC: Hg 8.8- slightly lower than yesteday, pltc WNL  No bleeding or infusion related issues per RN  Goal of Therapy:  Heparin level 0.3-0.7 units/ml Monitor platelets by anticoagulation protocol: Yes   Plan:   Continue heparin infusion at 1100 units/hr  Daily  CBC & HL    Monitor for signs and symptoms of bleeding  [name], PharmD, BCPS 03/28/2020 6:06 AM

## 2020-03-28 NOTE — Discharge Instructions (Addendum)
Deep Vein Thrombosis  Deep vein thrombosis (DVT) is a condition in which a blood clot forms in a deep vein, such as a vein in the lower leg, thigh, pelvis, or arm. Deep veins are veins in the deep venous system. A clot is blood that has thickened into a gel or solid. This condition is serious and can be life-threatening if the clot travels to the lungs and causes a blockage (pulmonary embolism) in the arteries of the lung. A DVT can also damage veins in the leg. This can lead to long-term, or chronic, venous disease, leg pain, swelling, discoloration, and ulcers or sores (post-thrombotic syndrome). What are the causes? This condition may be caused by:  A slowdown of blood flow.  Damage to a vein.  A condition that causes blood to clot more easily, such as certain blood-clotting disorders. What increases the risk? The following factors may make you more likely to develop this condition:  Having obesity.  Being older, especially older than age 48.  Being inactive (sedentary lifestyle) or not moving around. This may include: ? Sitting or lying down for longer than 4-6 hours other than to sleep at night. ? Being in the hospital, having major or lengthy surgery, or having a thin, flexible tube (central line catheter) placed in a large vein.  Being pregnant, giving birth, or having recently given birth.  Taking medicines that contain estrogen, such as birth control or hormone replacement therapy.  Using products that contain nicotine or tobacco, especially if you use hormonal birth control.  Having a history of blood clots or a blood-clotting disease, a blood vessel disease (peripheral vascular disease), or congestive heart disease.  Having a history of cancer, especially if being treated with chemotherapy. What are the signs or symptoms? Symptoms of this condition include:  Swelling, pain, pressure, or tenderness in an arm or a leg.  An arm or a leg becoming warm, red, or  discolored.  A leg turning very pale. You may have a large DVT. This is rare. If the clot is in your leg, you may notice symptoms more or have worse symptoms when you stand or walk. In some cases, there are no symptoms. How is this diagnosed? This condition is diagnosed with:  Your medical history and a physical exam.  Tests, such as: ? Blood tests to check how well your blood clots. ? Doppler ultrasound. This is the best way to find a DVT. ? Venogram. Contrast dye is injected into a vein, and X-rays are taken to check for clots. How is this treated? Treatment for this condition depends on:  The cause of your DVT.  The size and location of your DVT, or having more than one DVT.  Your risk for bleeding or developing more clots.  Other medical conditions you may have. Treatment may include:  Taking a blood thinner, also called an anticoagulant, to prevent clots from forming and growing.  Wearing compression stockings, if directed.  Injecting medicines into the affected vein to break up the clot (catheter-directed thrombolysis). This is used only for severe DVT and only if a specialist recommends it.  Specific surgical procedures, when DVT is severe or hard to treat. These may be done to: ? Isolate and remove your clot. ? Place an inferior vena cava (IVC) filter in a large vein to catch blood clots before they reach your lungs. You may get some medical treatments for 6 months or longer. Follow these instructions at home: If you are taking blood  thinners:  Talk with your health care provider before you take any medicines that contain aspirin or NSAIDs, such as ibuprofen. These medicines increase your risk for dangerous bleeding.  Take your medicine exactly as told, at the same time every day. Do not skip a dose. Do not take more than the prescribed dose. This is important.  Ask your health care provider about foods and medicines that could change the way your blood thinner  works (may interact). Avoid these foods and medicines if you are told to do so.  Avoid anything that may cause bleeding or bruising. You may bleed more easily while taking blood thinners. ? Be very careful when using knives, scissors, or other sharp objects. ? Use an electric razor instead of a blade. ? Avoid activities that could cause injury or bruising, and follow instructions for preventing falls. ? Tell your health care provider if you have had any internal bleeding, bleeding ulcers, or neurologic diseases, such as strokes or cerebral aneurysms.  Wear a medical alert bracelet or carry a card that lists what medicines you take. General instructions  Take over-the-counter and prescription medicines only as told by your health care provider.  Return to your normal activities as told by your health care provider. Ask your health care provider what activities are safe for you.  If recommended, wear compression stockings as told by your health care provider. These stockings help to prevent blood clots and reduce swelling in your legs.  Keep all follow-up visits as told by your health care provider. This is important. Contact a health care provider if:  You miss a dose of your blood thinner.  You have new or worse pain, swelling, or redness in an arm or a leg.  You have worsening numbness or tingling in an arm or a leg.  You have unusual bruising. Get help right away if:  You have signs or symptoms that a blood clot has moved to the lungs. These may include: ? Shortness of breath. ? Chest pain. ? Fast or irregular heartbeats (palpitations). ? Light-headedness or dizziness. ? Coughing up blood.  You have signs or symptoms that your blood is too thin. These may include: ? Blood in your vomit, stool, or urine. ? A cut that will not stop bleeding. ? A menstrual period that is heavier than usual. ? A severe headache or confusion. These symptoms may represent a serious problem that  is an emergency. Do not wait to see if the symptoms will go away. Get medical help right away. Call your local emergency services (911 in the [place] Do not drive yourself to the hospital. Summary  Deep vein thrombosis (DVT) happens when a blood clot forms in a deep vein. This may occur in the lower leg, thigh, pelvis, or arm.  Symptoms affect the arm or leg and can include swelling, pain, tenderness, warmth, redness, or discoloration.  This condition may be treated with medicines or compression stockings. In severe cases, surgery may be done.  If you are taking blood thinners, take them exactly as told. Do not skip a dose. Do not take more than is prescribed.  Get help right away if you have shortness of breath, chest pain, fast or irregular heartbeats, or blood in your vomit, urine, or stool. This information is not intended to replace advice given to you by your health care provider. Make sure you discuss any questions you have with your health care provider. Document Revised: 02/01/2019 Document Reviewed: 02/01/2019 Elsevier Patient Education  [place] on my medicine - XARELTO (rivaroxaban)   WHY WAS XARELTO PRESCRIBED FOR YOU? Xarelto was prescribed to treat blood clots that may have been found in the veins of your legs (deep vein thrombosis) or in your lungs (pulmonary embolism) and to reduce the risk of them occurring again.  What do you need to know about Xarelto? The starting dose is one 15 mg tablet taken TWICE daily with food for the FIRST 21 DAYS then  on 04/18/2020 the dose is changed to one 20 mg tablet taken ONCE A DAY with your evening meal.  DO NOT stop taking Xarelto without talking to the health care provider who prescribed the medication.  Refill your prescription for 20 mg tablets before you run out.  After discharge, you should have regular check-up appointments with your healthcare provider that is prescribing your Xarelto.  In the  future your dose may need to be changed if your kidney function changes by a significant amount.  What do you do if you miss a dose? If you are taking Xarelto TWICE DAILY and you miss a dose, take it as soon as you remember. You may take two 15 mg tablets (total 30 mg) at the same time then resume your regularly scheduled 15 mg twice daily the next day.  If you are taking Xarelto ONCE DAILY and you miss a dose, take it as soon as you remember on the same day then continue your regularly scheduled once daily regimen the next day. Do not take two doses of Xarelto at the same time.   Important Safety Information Xarelto is a blood thinner medicine that can cause bleeding. You should call your healthcare provider right away if you experience any of the following: ? Bleeding from an injury or your nose that does not stop. ? Unusual colored urine (red or dark brown) or unusual colored stools (red or black). ? Unusual bruising for unknown reasons. ? A serious fall or if you hit your head (even if there is no bleeding).  Some medicines may interact with Xarelto and might increase your risk of bleeding while on Xarelto. To help avoid this, consult your healthcare provider or pharmacist prior to using any new prescription or non-prescription medications, including herbals, vitamins, non-steroidal anti-inflammatory drugs (NSAIDs) and supplements.  This website has more information on Xarelto: [email or web address]

## 2020-03-28 NOTE — Progress Notes (Addendum)
Daily Progress Note   Patient Name: Amanda Rivers       Date: 03/28/2020 DOB: [birth year]  Age: 75 y.o. MRN#: [id number] Attending Physician: Bonnielee Haff, MD Primary Care Physician: Lucianne Lei, MD Admit Date: 03/25/2020  Reason for Consultation/Follow-up: Establishing goals of care  Subjective:  patient is awake and reasonably alert this morning, she is able to feed herself some breakfast, she denies pain, she recalls talking with her daughter [name] on the phone yesterday.   Length of Stay: 3  Current Medications: Scheduled Meds:  . (feeding supplement) PROSource Plus  30 mL Oral BID BM  . amoxicillin-clavulanate  1 tablet Oral Q12H  . dexamethasone (DECADRON) injection  4 mg Intravenous Daily  . feeding supplement  1 Container Oral Q24H  . feeding supplement  237 mL Oral BID BM  . montelukast  10 mg Oral QHS  . multivitamin with minerals  1 tablet Oral Daily  . pantoprazole  40 mg Oral Daily  . potassium chloride  40 mEq Oral Daily  . primidone  50 mg Oral TID  . Rivaroxaban  15 mg Oral BID WC   Followed by  . [START ON 04/18/2020] rivaroxaban  20 mg Oral Q supper  . rosuvastatin  20 mg Oral Daily    Continuous Infusions: . azithromycin 500 mg (03/27/20 1034)  . heparin 1,100 Units/hr (03/27/20 1032)    PRN Meds: acetaminophen **OR** acetaminophen, HYDROcodone-acetaminophen  Physical Exam         Resting in bed Regular work of breathing S 1 S 2  Appears chronically ill No focal deficits.  Abdomen not distended.   Vital Signs: BP 100/84 (BP Location: Left Arm)   Pulse 96   Temp 99.1 F (37.3 C) (Oral)   Resp (!) 21   Ht 5' 11.5" (1.816 m)   Wt 66.1 kg   SpO2 94%   BMI 20.04 kg/m  SpO2: SpO2: 94 % O2 Device: O2 Device: Nasal Cannula O2 Flow Rate: O2 Flow  Rate (L/min): 2 L/min  Intake/output summary:   Intake/Output Summary (Last 24 hours) at 03/28/2020 1049 Last data filed at 03/28/2020 0300 Gross per 24 hour  Intake 100 ml  Output 800 ml  Net -700 ml   LBM: Last BM Date: 03/25/20 Baseline Weight: Weight: 66.2 kg Most recent weight: Weight: 66.1 kg  PPS 50% Palliative Assessment/Data:      Patient Active Problem List   Diagnosis Date Noted  . Palliative care by specialist   . Goals of care, counseling/discussion   . General weakness   . Pneumonia 03/25/2020  . 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 Tremor 06/02/2019  . HTN (hypertension) 06/02/2019  . HLD (hyperlipidemia) 06/02/2019  . History of lung cancer 06/02/2019  . Chronic hyponatremia 06/02/2019  . GERD (gastroesophageal reflux disease) 06/02/2019  . 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  Brought into the emergency department by family because of failure to thrive going on for 2-3 weeks, markedly diminished oral intake and a functional decline as well as incontinence.  Work-up in the emergency department showed acute kidney injury as well as pneumonia.  Patient remains admitted to hospital medicine service.  Further work-up showed DVT and talks are being held regarding appropriate anticoagulation.  Occult oncology has also been consulted.  Patient was seeing radiation oncology in the outpatient setting.  Palliative consultation has been requested for CODE STATUS and goals of care discussions. Assessment:  continues to rally somewhat, how ever, at risk for decline and decompensation.    Recommendations/Plan:  DNR DNI Continue current mode of care.  Recommend SNF rehab with palliative on discharge if appropriate, how ever, if patient has further  decline, then, at that time, would initiate discussions regarding hospice philosophy of care with daughter [name].   Code Status:    Code Status Orders  (From admission, onward)         Start     Ordered   03/27/20 1800  Do not attempt resuscitation (DNR)  Continuous       Question Answer Comment  In the event of cardiac or respiratory ARREST Do not call a "code blue"   In the event of cardiac or respiratory ARREST Do not perform Intubation, CPR, defibrillation or ACLS   In the event of cardiac or respiratory ARREST Use medication by any route, position, wound care, and other measures to relive pain and suffering. May use oxygen, suction and manual treatment of airway obstruction as needed for comfort.      03/27/20 1759        Code Status History    Date Active Date Inactive Code Status Order ID Comments User Context   03/25/2020 [id number] 03/27/2020 1759 Full Code [id number]  Jonnie Finner, DO ED   Advance Care Planning Activity      Prognosis: Guarded, continue to monitor hospital course and overall disease trajectory.  Discharge Planning: To Be Determined  Care plan was discussed with patient  And RN.  Daughter [name] updated on the phone today 2-6 Thank you for allowing the Palliative Medicine Team to assist in the care of this patient.   Time In: 9 Time Out: 9.25 Total Time 25 Prolonged Time Billed No       Greater than 50%  of this time was spent counseling and coordinating care related to the above assessment and plan.  Loistine Chance, MD  Please contact Palliative Medicine Team phone at [phone] for questions and concerns.

## 2020-03-28 NOTE — Plan of Care (Signed)

## 2020-03-28 NOTE — Progress Notes (Signed)
Pt has poor appetite, change diet to Pureed, pt did eat much only taken in small bites and [employer] or [employer]. Will continue to encourage PO intake and pass on to next shift. SRP, RN

## 2020-03-28 NOTE — Progress Notes (Signed)
ANTICOAGULATION CONSULT NOTE - Initial Consult  Pharmacy Consult for rivaroxaban (Xarelto) Indication: DVT  No Known Allergies  Patient Measurements: Height: 5' 11.5" (181.6 cm) Weight: 66.1 kg (145 lb 11.6 oz) IBW/kg (Calculated) : 71.95  Vital Signs: Temp: 99.1 F (37.3 C) (02/06 0500) Temp Source: Oral (02/06 0500) BP: 100/84 (02/06 0500) Pulse Rate: 96 (02/06 0500)  Labs: Recent Labs    03/25/20 1248 03/25/20 1248 03/25/20 1400 03/26/20 0424 03/26/20 2058 03/27/20 0520 03/27/20 1251 03/28/20 0525  HGB 12.0  --   --  9.6*  --  9.2*  --  8.8*  HCT 38.6  --   --  30.8*  --  29.3*  --  28.8*  PLT 117*  --   --  99*  --  145*  --  160  APTT 36  --   --   --   --   --   --   --   LABPROT 19.3*  --   --  18.8*  --   --   --   --   INR 1.7*  --   --  1.6*  --   --   --   --   HEPARINUNFRC  --   --   --   --    < > 0.65 0.57 0.56  CREATININE  --    < > 2.00* 1.33*  --  1.00  --  1.14*  CKTOTAL  --   --  40  --   --   --   --   --    < > = values in this interval not displayed.    Estimated Creatinine Clearance: 45.2 mL/min (A) (by C-G formula based on SCr of 1.14 mg/dL (H)).   Medical History: Past Medical History:  Diagnosis Date  . HLD (hyperlipidemia)   . Hypertension   . Malignant neoplasm of bronchus and lung, unspecified site   . Reflux     Medications:  No PTA anticoagulation.  Heparin initiated on 2/4 and currently infusing at 1100 units/hour.  Assessment: Pharmacy consulted to dose rivaroxaban for acute bilateral lower extremity DVT.  Estimated creatinine clearance greater than 30 ml/min.  Goal of Therapy:  DVT treatment Monitor platelets by anticoagulation protocol: Yes   Plan:  Discontinue heparin infusion and daily heparin level Rivaroxaban 15 mg twice daily with food for 21 days followed by 20 mg once daily with food. Monitor renal function, CBC, s/s bleeding  Fischer Halley P. Legrand Como, PharmD, [place] Please  utilize [employer]ion for appropriate phone number to reach the unit pharmacist ([place]) 03/28/2020 10:46 AM

## 2020-03-29 ENCOUNTER — Ambulatory Visit: Payer: Medicare HMO

## 2020-03-29 DIAGNOSIS — Z7189 Other specified counseling: Secondary | ICD-10-CM | POA: Diagnosis not present

## 2020-03-29 DIAGNOSIS — Z515 Encounter for palliative care: Secondary | ICD-10-CM | POA: Diagnosis not present

## 2020-03-29 DIAGNOSIS — J189 Pneumonia, unspecified organism: Secondary | ICD-10-CM | POA: Diagnosis not present

## 2020-03-29 DIAGNOSIS — R531 Weakness: Secondary | ICD-10-CM | POA: Diagnosis not present

## 2020-03-29 LAB — CBC
HCT: 26.8 % — ABNORMAL LOW (ref 36.0–46.0)
Hemoglobin: 8.3 g/dL — ABNORMAL LOW (ref 12.0–15.0)
MCH: 24.4 pg — ABNORMAL LOW (ref 26.0–34.0)
MCHC: 31 g/dL (ref 30.0–36.0)
MCV: 78.8 fL — ABNORMAL LOW (ref 80.0–100.0)
Platelets: 157 10*3/uL (ref 150–400)
RBC: 3.4 MIL/uL — ABNORMAL LOW (ref 3.87–5.11)
RDW: 15.7 % — ABNORMAL HIGH (ref 11.5–15.5)
WBC: 8.6 10*3/uL (ref 4.0–10.5)
nRBC: 0 % (ref 0.0–0.2)

## 2020-03-29 LAB — COMPREHENSIVE METABOLIC PANEL
ALT: 65 U/L — ABNORMAL HIGH (ref 0–44)
AST: 84 U/L — ABNORMAL HIGH (ref 15–41)
Albumin: 2.1 g/dL — ABNORMAL LOW (ref 3.5–5.0)
Alkaline Phosphatase: 61 U/L (ref 38–126)
Anion gap: 11 (ref 5–15)
BUN: 17 mg/dL (ref 8–23)
CO2: 20 mmol/L — ABNORMAL LOW (ref 22–32)
Calcium: 8.2 mg/dL — ABNORMAL LOW (ref 8.9–10.3)
Chloride: 107 mmol/L (ref 98–111)
Creatinine, Ser: 0.88 mg/dL (ref 0.44–1.00)
GFR, Estimated: >60 mL/min (ref >60)
Glucose, Bld: 103 mg/dL — ABNORMAL HIGH (ref 70–99)
Potassium: 3.3 mmol/L — ABNORMAL LOW (ref 3.5–5.1)
Sodium: 138 mmol/L (ref 135–145)
Total Bilirubin: 0.5 mg/dL (ref 0.3–1.2)
Total Protein: 5.3 g/dL — ABNORMAL LOW (ref 6.5–8.1)

## 2020-03-29 LAB — MAGNESIUM: Magnesium: 1.4 mg/dL — ABNORMAL LOW (ref 1.7–2.4)

## 2020-03-29 MED ORDER — POTASSIUM CHLORIDE 20 MEQ PO PACK
40.0000 meq | PACK | Freq: Once | ORAL | Status: AC
  Administered 2020-03-29: 40 meq via ORAL
  Filled 2020-03-29: qty 2

## 2020-03-29 MED ORDER — POTASSIUM CHLORIDE CRYS ER 20 MEQ PO TBCR: 40.0000 meq | EXTENDED_RELEASE_TABLET | Freq: Once | ORAL | Status: DC

## 2020-03-29 MED ORDER — MAGNESIUM SULFATE 4 GM/100ML IV SOLN
4.0000 g | Freq: Once | INTRAVENOUS | Status: AC
  Administered 2020-03-29: 4 g via INTRAVENOUS
  Filled 2020-03-29: qty 100

## 2020-03-29 NOTE — Care Management Important Message (Signed)
Important Message  Patient Details IM Letter given to the Patient. Name: Amanda Rivers MRN: [id number] Date of Birth: 02/08/1946   Medicare Important Message Given:  Yes     Kerin Salen 03/29/2020, 1:37 PM

## 2020-03-29 NOTE — Evaluation (Signed)
Occupational Therapy Evaluation Patient Details Name: Amanda Rivers MRN: [id number] DOB: Jun 04, 1945 Today's Date: 03/29/2020    History of Present Illness 75 year old female with HTN, HLD, oral cancer on radiation (last dose ~ 3 wks ago).  Brought in due to failure to thrive.  Patient was found to have pneumonia and acute DVT   Clinical Impression   Prior to admission patient lived alone in apartment with niece coming over daily to assist with ADLs/IADLs in addition to church friends. Pt states granddaughter from [employer] gets her groceries for her. Pt's children live ~2hrs away and current family/friends checking on patient cannot provide 24/7 support therefore patient and family are wanting to transition patient to ALF. Currently patient presents with weakness, decreased endurance, balance and safety needing set up to min for UB ADL, max A for LB ALD and min A for functional transfers. Pt desat to 87% on RA with functional transfers therefore donned 2L and max cues PLB with increase to low 90s. Patient would benefit from continued acute OT services to maximize activity tolerance and global strengthening in order to facilitate D/C to venue listed below.    Follow Up Recommendations  SNF;Other (comment) (pt + family want to transition patient to ALF, would benefit from [facility] first.)    Equipment Recommendations  None recommended by OT       Precautions / Restrictions Precautions Precautions: Fall Precaution Comments: monitor O2, desat on room air Restrictions Weight Bearing Restrictions: No      Mobility Bed Mobility Overal bed mobility: Needs Assistance Bed Mobility: Supine to Sit     Supine to sit: Min assist;HOB elevated     General bed mobility comments: llight min A for trunk support    Transfers Overall transfer level: Needs assistance Equipment used: Rolling walker (2 wheeled) Transfers: Sit to/from Stand Sit to Stand: Min assist         General transfer  comment: pt needs min A to power up to standing especially from lower surfaces (recliner, BSC) and mod cues for hand placement. First sit to stand from recliner patient did not reach back with poor eccentric control    Balance Overall balance assessment: Needs assistance Sitting-balance support: Feet supported Sitting balance-Leahy Scale: Fair     Standing balance support: Bilateral upper extremity supported Standing balance-Leahy Scale: Poor Standing balance comment: reliant on external support                           ADL either performed or assessed with clinical judgement   ADL Overall ADL's : Needs assistance/impaired Eating/Feeding: Independent;Sitting Eating/Feeding Details (indicate cue type and reason): to drink from cup Grooming: Set up;Sitting   Upper Body Bathing: Minimal assistance;Sitting   Lower Body Bathing: Maximal assistance;Sitting/lateral leans;Sit to/from stand   Upper Body Dressing : Minimal assistance;Sitting   Lower Body Dressing: Maximal assistance;Sitting/lateral leans;Sit to/from stand Lower Body Dressing Details (indicate cue type and reason): patient with decreased activity tolerance, fatigued after using BSC Toilet Transfer: Minimal assistance;Cueing for safety;Cueing for sequencing;Stand-pivot;BSC;RW Toilet Transfer Details (indicate cue type and reason): cues for safe hand placement, min A to power up from lower commode Toileting- Clothing Manipulation and Hygiene: Total assistance;Sit to/from stand Toileting - Clothing Manipulation Details (indicate cue type and reason): patient incontinent of stool upon arrival and had additional loose stool on BSC. patient with decreased balance + standing tolerance needing total A for perianal care     Functional mobility during ADLs: Minimal assistance;Cueing  for safety;Cueing for sequencing;Rolling walker General ADL Comments: patient requiring assistance with self care due to weakness, decreased  endurance and safety awareness                  Pertinent Vitals/Pain Pain Assessment: Faces Faces Pain Scale: Hurts a little bit Pain Location: R arm Pain Descriptors / Indicators: Sore Pain Intervention(s): Monitored during session     Hand Dominance Right   Extremity/Trunk Assessment Upper Extremity Assessment Upper Extremity Assessment: Generalized weakness   Lower Extremity Assessment Lower Extremity Assessment: Defer to PT evaluation       Communication Communication Communication: No difficulties   Cognition Arousal/Alertness: Awake/alert Behavior During Therapy: WFL for tasks assessed/performed Overall Cognitive Status: No family/caregiver present to determine baseline cognitive functioning                                 General Comments: oriented to self and place negative for month/year   General Comments  doffed O2 for toilet transfer, patient did desat to 87% after use of commode and transfer to recliner. Donned 2L and max cues for PLB, pt increase to 94%            Home Living Family/patient expects to be discharged to:: Private residence Living Arrangements: Alone Available Help at Discharge: Family;Available PRN/intermittently;Friend(s) Type of Home: Apartment Home Access: Level entry     Home Layout: One level     Bathroom Shower/Tub: Teacher, early years/pre: Handicapped height     Home Equipment: Tub bench;Walker - 4 wheels;Grab bars - tub/shower          Prior Functioning/Environment Level of Independence: Needs assistance  Gait / Transfers Assistance Needed: ambulates with rollator ADL's / Homemaking Assistance Needed: family/friends come over daily to assist bathing, dressing, meal prep, grocery shopping. pt states she could ambulate to bathroom on her own if family wasn't there   Comments: niece comes over daily but has not been enough due to patient's decline, family is looking into transitioning  patient to ALF        OT Problem List: Decreased strength;Decreased activity tolerance;Impaired balance (sitting and/or standing);Decreased cognition;Decreased safety awareness      OT Treatment/Interventions: Self-care/ADL training;Therapeutic exercise;DME and/or AE instruction;Therapeutic activities;Cognitive remediation/compensation;Patient/family education;Balance training    OT Goals(Current goals can be found in the care plan section) Acute Rehab OT Goals Patient Stated Goal: use commode OT Goal Formulation: With patient Time For Goal Achievement: 04/12/20 Potential to Achieve Goals: Good  OT Frequency: Min 2X/week    AM-PAC OT "6 Clicks" Daily Activity     Outcome Measure Help from another person eating meals?: None Help from another person taking care of personal grooming?: A Little Help from another person toileting, which includes using toliet, bedpan, or urinal?: A Lot Help from another person bathing (including washing, rinsing, drying)?: A Lot Help from another person to put on and taking off regular upper body clothing?: A Little Help from another person to put on and taking off regular lower body clothing?: A Lot 6 Click Score: 16   End of Session Equipment Utilized During Treatment: Rolling walker;Oxygen Nurse Communication: Mobility status;Other (comment) (bowel movement)  Activity Tolerance: Patient tolerated treatment well Patient left: in chair;with call bell/phone within reach;with chair alarm set  OT Visit Diagnosis: Unsteadiness on feet (R26.81);Muscle weakness (generalized) (M62.81);Other symptoms and signs involving cognitive function  Time: 3235-5732 OT Time Calculation (min): 33 min Charges:  OT General Charges $OT Visit: 1 Visit OT Evaluation $OT Eval Moderate Complexity: 1 Mod OT Treatments $Self Care/Home Management : 8-22 mins  Delbert Phenix OT OT pager: [place] 03/29/2020, 9:49 AM

## 2020-03-29 NOTE — Progress Notes (Signed)
Daily Progress Note   Patient Name: Amanda Rivers       Date: 03/29/2020 DOB: Jul 07, 1945  Age: 75 y.o. MRN#: [id number] Attending Physician: Bonnielee Haff, MD Primary Care Physician: Lucianne Lei, MD Admit Date: 03/25/2020  Reason for Consultation/Follow-up: Establishing goals of care  Subjective:  patient is awake and alert this morning, she is being assisted with feeding, being encouraged to increase PO. PT OT have also been consulted.    Length of Stay: 4  Current Medications: Scheduled Meds:  . (feeding supplement) PROSource Plus  30 mL Oral BID BM  . amoxicillin-clavulanate  1 tablet Oral Q12H  . azithromycin  500 mg Oral Daily  . dexamethasone (DECADRON) injection  4 mg Intravenous Daily  . feeding supplement  1 Container Oral Q24H  . feeding supplement  237 mL Oral BID BM  . montelukast  10 mg Oral QHS  . multivitamin with minerals  1 tablet Oral Daily  . pantoprazole  40 mg Oral Daily  . potassium chloride  40 mEq Oral Daily  . potassium chloride  40 mEq Oral Once  . primidone  50 mg Oral TID  . Rivaroxaban  15 mg Oral BID WC   Followed by  . [START ON 04/18/2020] rivaroxaban  20 mg Oral Q supper  . rosuvastatin  20 mg Oral Daily    Continuous Infusions: . magnesium sulfate bolus IVPB      PRN Meds: acetaminophen **OR** acetaminophen, HYDROcodone-acetaminophen  Physical Exam         Resting in bed Regular work of breathing S 1 S 2  Appears chronically ill No focal deficits.  Abdomen not distended.   Vital Signs: BP 118/74 (BP Location: Left Arm)   Pulse 97   Temp 98.6 F (37 C) (Oral)   Resp 20   Ht 5' 11.5" (1.816 m)   Wt 66.1 kg   SpO2 100%   BMI 20.04 kg/m  SpO2: SpO2: 100 % O2 Device: O2 Device: Nasal Cannula O2 Flow Rate: O2 Flow Rate (L/min):  5 L/min  Intake/output summary:   Intake/Output Summary (Last 24 hours) at 03/29/2020 1129 Last data filed at 03/29/2020 0428 Gross per 24 hour  Intake 960 ml  Output 900 ml  Net 60 ml   LBM: Last BM Date: 03/29/20 Baseline Weight: Weight: 66.2 kg Most recent  weight: Weight: 66.1 kg      PPS 50% Palliative Assessment/Data:      Patient Active Problem List   Diagnosis Date Noted  . Community acquired pneumonia of left lower lobe of lung   . Shortness of breath   . Palliative care by specialist   . Goals of care, counseling/discussion   . General weakness   . Pneumonia 03/25/2020  . 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 Tremor 06/02/2019  . HTN (hypertension) 06/02/2019  . HLD (hyperlipidemia) 06/02/2019  . History of lung cancer 06/02/2019  . Chronic hyponatremia 06/02/2019  . GERD (gastroesophageal reflux disease) 06/02/2019  . 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  Brought into the emergency department by family because of failure to thrive going on for 2-3 weeks, markedly diminished oral intake and a functional decline as well as incontinence.  Work-up in the emergency department showed acute kidney injury as well as pneumonia.  Patient remains admitted to hospital medicine service.  Further work-up showed DVT and talks are being held regarding appropriate anticoagulation.  Occult oncology has also been consulted.  Patient was seeing radiation oncology in the outpatient setting.  Palliative consultation has been requested for CODE STATUS and goals of care discussions. Assessment:  continues to rally somewhat, how ever, at risk for decline and decompensation.    Recommendations/Plan:  DNR DNI Continue current mode of care.  Recommend SNF rehab with  palliative on discharge if appropriate, how ever, if patient has further decline, then, at that time, would initiate discussions regarding hospice philosophy of care with daughter [name].  No new PMT specific recommendations at this time.   Code Status:    Code Status Orders  (From admission, onward)         Start     Ordered   03/27/20 1800  Do not attempt resuscitation (DNR)  Continuous       Question Answer Comment  In the event of cardiac or respiratory ARREST Do not call a "code blue"   In the event of cardiac or respiratory ARREST Do not perform Intubation, CPR, defibrillation or ACLS   In the event of cardiac or respiratory ARREST Use medication by any route, position, wound care, and other measures to relive pain and suffering. May use oxygen, suction and manual treatment of airway obstruction as needed for comfort.      03/27/20 1759        Code Status History    Date Active Date Inactive Code Status Order ID Comments User Context   03/25/2020 [id number] 03/27/2020 1759 Full Code [id number]  Jonnie Finner, DO ED   Advance Care Planning Activity      Prognosis: Guarded, continue to monitor hospital course and overall disease trajectory.  Discharge Planning: To Be Determined  Care plan was discussed with IDT.     Daughter [name] updated on the phone   2-6 Thank you for allowing the Palliative Medicine Team to assist in the care of this patient.   Time In: 9 Time Out: 9.25 Total Time 25 Prolonged Time Billed No       Greater than 50%  of this time was spent counseling and coordinating care related to the above assessment and plan.  Loistine Chance, MD  Please contact Palliative Medicine Team phone at [phone] for  questions and concerns.

## 2020-03-29 NOTE — Progress Notes (Signed)
  Speech Language Pathology Treatment: Dysphagia  Patient Details Name: Amanda Rivers MRN: [id number] DOB: May 05, 1945 Today's Date: 03/29/2020 Time: 1200-1220 SLP Time Calculation (min) (ACUTE ONLY): 20 min  Assessment / Plan / Recommendation Clinical Impression  Pt was seen at bedside for follow up after BSE was completed 03/26/20. Pt currently receiving pureed solids due to "poor appetite". Pt had breakfast tray in front of her and had not eaten any of the food on the tray. Pt was observed with thin liquids and applesauce. No overt s/s aspiration observed. Discussed Dys1 vs Dys2 diet with pt, and decided to re-order dys 2 to encourage PO intake in keeping with poor dentition.  Safe swallow precautions posted at [facility]. SLP will continue to follow to assess diet tolerance and provide ongoing education as needed.    HPI HPI: 75 y.o. female with medical history significant of HLD, HTN. Presenting with leg pain. Patient is poor historian. History from granddaughter. 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 She had her last dose almost 3 weeks ago. Her granddaughter reports that she has "gone downhill" since starting radiation. She has become progressively weak and finding it harder to move. She complains of leg weakness and pain. She has had poor appetite that has worsened in the last 3 days (1/2 cup of soup is all she has eaten in 3 days). She is increasingly not seeming herself. She has become more incontinent and somewhat confused. The family has noted some difficulty breathing and cough.      SLP Plan  Continue with current plan of care       Recommendations  Diet recommendations: Dysphagia 2 (fine chop);Thin liquid Liquids provided via: Straw Medication Administration: Whole meds with puree Supervision: Patient able to self feed;Intermittent supervision to cue for compensatory strategies Compensations: Minimize environmental distractions;Slow rate;Small  sips/bites Postural Changes and/or Swallow Maneuvers: Seated upright 90 degrees;Upright 30-60 min after meal                Oral Care Recommendations: Oral care BID Follow up Recommendations: Other (comment) (tbd) SLP Visit Diagnosis: Dysphagia, unspecified (R13.10) Plan: Continue with current plan of care       [place]. Quentin Ore, [facility], [place] Speech Language Pathologist Office: [phone] Pager: [phone]  Shonna Chock 03/29/2020, 12:21 PM

## 2020-03-29 NOTE — Progress Notes (Signed)
PROGRESS NOTE    Amanda Rivers  XBJ:[id number] DOB: May 20, 1945 DOA: 03/25/2020 PCP: Lucianne Lei, MD   Chief Complaint  Patient presents with  . 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  Brought in due to failure to thrive.  Patient was found to have pneumonia.  Also found to have acute DVT.  Hospitalized for further management.     Subjective: Patient noted to be awake alert.  Denies any pain.  States that she would like to have some soup.   Assessment & Plan:  Left upper lobe pneumonia/sepsis POA/possible radiation pneumonitis Patient presented with tachycardia tachypnea leukocytosis, extensive infiltrate left upper lobe. Tuberculosis was a consideration.  Patient seen by pulmonology who did not think that this was tuberculosis.   Isolation precautions have been discontinued. Patient was placed on ceftriaxone and azithromycin.  Changed over to Augmentin.  She will also complete a 5-day course of azithromycin. WBC noted to be normal.   Patient started on steroids by pulmonology due to concern for radiation pneumonitis.  Continue dexamethasone for now.  AKI/normal anion gap metabolic acidosis/hypokalemia and hypomagnesemia Baseline creatinine 1-1.2.  Presented with creatinine of 2.2.   Improved with IV hydration.  Monitor urine output.  Potassium level remains low.  Will be supplemented.  Magnesium was 1.4.  Will also be supplemented.   Bicarbonate level is stable.  Failure to thrive x3 weeks Patient has had poor oral intake.  Apparently has been going downhill since she was started on radiation treatment about a month ago.  Abdominal ultrasound was done which showed gallbladder sludge without any gallstones or hydronephrosis.  Continue to encourage oral intake.  PT and OT.     Oropharyngeal dysphagia Aspiration precautions.  SLP to evaluate.  Acute metabolic  encephalopathy Likely due to metabolic derangements and dehydration.  Seems to be improved.  Continue supportive care.    Acute bilateral lower extremity DVT Patient had bilateral leg pain and D-dimer more than 20 which prompted duplex of the leg which revealed bilateral DVT. Discussed w/ her oncologist Dr Chryl Heck, and also with patient's daughter. Patient started on IV heparin.  Due to improvement in clinical status and renal function patient was switched over to rivaroxaban yesterday.  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 Isidore Moos and Dr. Chryl Heck. Last radiation 03/05/2020 and going down hill since.  Per oncology Dr. Chryl Heck- patient refused chemo and radiation, and refused further work-up of pulmonary nodules with bronchoscopy.  Palliative care following closely.    Thrombocytopenia Possibly due to consumption.  This has resolved.  Anemia of chronic disease Hemoglobin low but stable.  No overt bleeding noted.  Transfuse if it drops below 7.    New onset prediabetes w/ hyperglycemia HbA1c 6.3.  Elevated glucose levels could be due to steroids.  Patient noted to be on dexamethasone   Abnormal LFTs Abdominal ultrasound did not show any acute findings.  LFTs stable.  Positive hepatitis C antibody, likely false positive HCV quantitative test did not detect any virus.  Goals of care Palliative care is following.  Patient noted to be DNR/DNI after pulmonology discussed patient's condition with patient's family.  DVT prophylaxis: Was changed over to rivaroxaban Code Status: DNR Family Communication: None at bedside Disposition: We will need PT and OT evaluation.  May need placement.  Status is: Inpatient Remains inpatient appropriate because:IV treatments appropriate due to intensity of  illness or inability to take PO and For ongoing management of DVT, failure to thrive renal failure, pneumonia  Dispo:The patient is from: Home lives alone,  daughter lives 2 hrs away.            Anticipated d/c is to: Waiting on PT and OT evaluation.  May need to go to SNF.            Anticipated d/c date is:2- 3 days            Patient currently is not medically stable to d/c.   Difficult to place patient No   Consultants: Pulmonology Procedures: None yet  Culture/Microbiology    Component Value Date/Time   SDES  03/26/2020 0742    BLOOD BLOOD LEFT FOREARM Performed at [facility], [place] [address], [place], [place] [address]    SPECREQUEST  03/26/2020 [phone]    BOTTLES DRAWN AEROBIC ONLY Blood Culture results may not be optimal due to an inadequate volume of blood received in culture bottles Performed at [facility], [place] [address], [place], [place] [address]    CULT  03/26/2020 [phone]    NO GROWTH 3 DAYS Performed at [facility], [place] [address], [place], [place] [address]    REPTSTATUS PENDING 03/26/2020 1443    Other culture-see note  Medications: Scheduled Meds: . (feeding supplement) PROSource Plus  30 mL Oral BID BM  . amoxicillin-clavulanate  1 tablet Oral Q12H  . azithromycin  500 mg Oral Daily  . dexamethasone (DECADRON) injection  4 mg Intravenous Daily  . feeding supplement  1 Container Oral Q24H  . feeding supplement  237 mL Oral BID BM  . montelukast  10 mg Oral QHS  . multivitamin with minerals  1 tablet Oral Daily  . pantoprazole  40 mg Oral Daily  . potassium chloride  40 mEq Oral Daily  . primidone  50 mg Oral TID  . Rivaroxaban  15 mg Oral BID WC   Followed by  . [START ON 04/18/2020] rivaroxaban  20 mg Oral Q supper  . rosuvastatin  20 mg Oral Daily   Continuous Infusions:   Antimicrobials: Anti-infectives (From admission, onward)   Start     Dose/Rate Route Frequency Ordered Stop   03/29/20 1000  amoxicillin-clavulanate (AUGMENTIN) 875-125 MG per tablet 1 tablet        1 tablet Oral Every 12 hours 03/28/20 1020     03/29/20 1000  azithromycin (ZITHROMAX)  tablet 500 mg        500 mg Oral Daily 03/28/20 1738 03/31/20 0959   03/26/20 1000  cefTRIAXone (ROCEPHIN) 2 g in sodium chloride 0.9 % 100 mL IVPB  Status:  Discontinued        2 g 200 mL/hr over 30 Minutes Intravenous Every 24 hours 03/25/20 1850 03/28/20 1020   03/26/20 1000  azithromycin (ZITHROMAX) 500 mg in sodium chloride 0.9 % 250 mL IVPB  Status:  Discontinued        500 mg 250 mL/hr over 60 Minutes Intravenous Every 24 hours 03/25/20 1850 03/28/20 1738   03/25/20 1615  azithromycin (ZITHROMAX) 500 mg in sodium chloride 0.9 % 250 mL IVPB        500 mg 250 mL/hr over 60 Minutes Intravenous  Once 03/25/20 1609 03/25/20 1941   03/25/20 1400  cefTRIAXone (ROCEPHIN) 1 g in sodium chloride 0.9 % 100 mL IVPB        1 g 200 mL/hr over 30 Minutes Intravenous  Once 03/25/20 1355 03/25/20 1636     Objective: Vitals: Today's Vitals   03/28/20 2159 03/28/20 2200 03/28/20 2233 03/29/20 0648  BP:   101/67 118/74  Pulse:   91 97  Resp: 17  20 20   Temp:   98.4 F (36.9 C) 98.6 F (37 C)  TempSrc:   Oral Oral  SpO2:   100% 100%  Weight:      Height:      PainSc:  0-No pain      Intake/Output Summary (Last 24 hours) at 03/29/2020 0935 Last data filed at 03/29/2020 0428 Gross per 24 hour  Intake 960 ml  Output 900 ml  Net 60 ml   Filed Weights   03/25/20 1330 03/25/20 2155  Weight: 66.2 kg 66.1 kg   Weight change:   Intake/Output from previous day: 02/06 0701 - 02/07 0700 In: 960 [P.O.:960] Out: 900 [Urine:900] Intake/Output this shift: No intake/output data recorded. Filed Weights   03/25/20 1330 03/25/20 2155  Weight: 66.2 kg 66.1 kg    Examination:  General appearance: Awake alert.  In no distress.  Distracted Resp: Clear to auscultation bilaterally.  Normal effort Cardio: S1-S2 is normal regular.  No S3-S4.  No rubs murmurs or bruit GI: Abdomen is soft.  Nontender nondistended.  Bowel sounds are present normal.  No masses organomegaly Extremities: No edema.    Neurologic:  No focal neurological deficits.     Data Reviewed: I have personally reviewed following labs and imaging studies CBC: Recent Labs  Lab 03/25/20 1248 03/26/20 0424 03/27/20 0520 03/28/20 0525 03/29/20 0413  WBC 11.9* 10.2 9.5 8.0 8.6  NEUTROABS 10.4*  --   --   --   --   HGB 12.0 9.6* 9.2* 8.8* 8.3*  HCT 38.6 30.8* 29.3* 28.8* 26.8*  MCV 78.8* 78.0* 78.1* 79.6* 78.8*  PLT 117* 99* 145* 160 875   Basic Metabolic Panel: Recent Labs  Lab 03/25/20 1400 03/26/20 0424 03/27/20 0520 03/28/20 0525 03/29/20 0413  NA 140 140 134* 140 138  K 4.4 3.9 3.7 3.4* 3.3*  CL 104 109 108 111 107  CO2 21* 20* 19* 19* 20*  GLUCOSE 189* 105* 106* 215* 103*  BUN 37* 32* 22 18 17   CREATININE 2.00* 1.33* 1.00 1.14* 0.88  CALCIUM 9.1 8.4* 7.9* 7.9* 8.2*  MG  --   --   --   --  1.4*   GFR: Estimated Creatinine Clearance: 58.5 mL/min (by C-G formula based on SCr of 0.88 mg/dL).  Liver Function Tests: Recent Labs  Lab 03/25/20 1400 03/26/20 0424 03/27/20 0520 03/28/20 0525 03/29/20 0413  AST 74* 63* 69* 52* 84*  ALT 53* 50* 52* 45* 65*  ALKPHOS 74 66 73 63 61  BILITOT 0.6 0.6 0.3 0.2* 0.5  PROT 7.1 6.0* 5.8* 5.3* 5.3*  ALBUMIN 3.1* 2.7* 2.5* 2.2* 2.1*   Coagulation Profile: Recent Labs  Lab 03/25/20 1248 03/26/20 0424  INR 1.7* 1.6*   Cardiac Enzymes: Recent Labs  Lab 03/25/20 1400  CKTOTAL 40   HbA1C: No results for input(s): HGBA1C in the last 72 hours. Anemia Panel: Recent Labs    03/27/20 0520  VITAMINB12 5,690*  FOLATE 9.0  FERRITIN 345*  TIBC 150*  IRON 15*   Sepsis Labs: Recent Labs  Lab 03/25/20 2010 03/25/20 2235 03/26/20 0424 03/27/20 1800 03/27/20 2006  PROCALCITON  --   --  0.57  --   --   LATICACIDVEN 1.1 1.0  --  1.2 1.3    Recent  Results (from the past 240 hour(s))  SARS Coronavirus 2 by RT PCR (hospital order, performed in [facility] hospital lab) Nasopharyngeal Nasopharyngeal Swab     Status: None   Collection Time:  03/25/20  2:17 PM   Specimen: Nasopharyngeal Swab  Result Value Ref Range Status   SARS Coronavirus 2 NEGATIVE NEGATIVE Final    Comment: (NOTE) SARS-CoV-2 target nucleic acids are NOT DETECTED.  The SARS-CoV-2 RNA is generally detectable in upper and lower respiratory specimens during the acute phase of infection. The lowest concentration of SARS-CoV-2 viral copies this assay can detect is 250 copies / mL. A negative result does not preclude SARS-CoV-2 infection and should not be used as the sole basis for treatment or other patient management decisions.  A negative result may occur with improper specimen collection / handling, submission of specimen other than nasopharyngeal swab, presence of viral mutation(s) within the areas targeted by this assay, and inadequate number of viral copies (<250 copies / mL). A negative result must be combined with clinical observations, patient history, and epidemiological information.  Fact Sheet for Patients:   StrictlyIdeas.no  Fact Sheet for Healthcare Providers: BankingDealers.co.za  This test is not yet approved or  cleared by the [place] FDA and has been authorized for detection and/or diagnosis of SARS-CoV-2 by FDA under an Emergency Use Authorization (EUA).  This EUA will remain in effect (meaning this test can be used) for the duration of the COVID-19 declaration under Section 564(b)(1) of the Act, 21 U.S.C. section 360bbb-3(b)(1), unless the authorization is terminated or revoked sooner.  Performed at [facility], [place] [address], [place], [place] [address]   Urine culture     Status: Abnormal   Collection Time: 03/25/20  4:40 PM   Specimen: In/Out Cath Urine  Result Value Ref Range Status   Specimen Description   Final    IN/OUT CATH URINE Performed at [place] [address], [place], [place] [address]    Special Requests   Final     NONE Performed at [facility], [place] [address], [place], [place] [address]    Culture MULTIPLE SPECIES PRESENT, SUGGEST RECOLLECTION (A)  Final   Report Status 03/27/2020 FINAL  Final  Blood culture (routine single)     Status: None (Preliminary result)   Collection Time: 03/26/20  7:42 AM   Specimen: BLOOD  Result Value Ref Range Status   Specimen Description   Final    BLOOD BLOOD LEFT FOREARM Performed at [place] [address], [place], [place] [address]    Special Requests   Final    BOTTLES DRAWN AEROBIC ONLY Blood Culture results may not be optimal due to an inadequate volume of blood received in culture bottles Performed at [place] [address] [place], [place] [address]    Culture   Final    NO GROWTH 3 DAYS Performed at [facility], [place] [address], [place], [place] [address]    Report Status PENDING  Incomplete     Radiology Studies: DG Chest Port 1 View  Result Date: 03/27/2020 CLINICAL DATA:  Chest pain and shortness of breath, diminished breath sounds by report. EXAM: PORTABLE CHEST 1 VIEW COMPARISON:  Multiple prior studies dating back to 2010, most recent March 25, 2020 FINDINGS: Accounting for differences in technique no interval change in the appearance of interstitial and airspace disease in the LEFT upper chest compared to the recent comparison study.  Signs of emphysema in the background as before. Pleural and parenchymal scarring at the RIGHT lung base unchanged. Cardiomediastinal contours and hilar structures are stable with partially obscured LEFT hilar structures due to interstitial and airspace opacities. On limited assessment no acute skeletal process. IMPRESSION: No interval change in the appearance of the chest compared to the recent comparison study. Signs of emphysema with basilar scarring RIGHT greater than LEFT. Findings may represent infection as discussed previously. Note that  this patient also has a history of neoplasm. Lymphangitic carcinomatosis is another differential consideration. Would also correlate with whether there has been radiation therapy to this area that could also be a cause of pneumonitis. CT of the chest may be helpful for further evaluation. Electronically Signed   By: Zetta Bills M.D.   On: 03/27/2020 15:24     LOS: 4 days   Bonnielee Haff, MD [facility]  03/29/2020, 9:35 AM

## 2020-03-29 NOTE — Evaluation (Signed)
Physical Therapy Evaluation Patient Details Name: Amanda Rivers MRN: [id number] DOB: 07/21/1945 Today's Date: 03/29/2020   History of Present Illness  75 year old female with HTN, HLD, oral cancer on radiation (last dose ~ 3 wks ago).  Brought in due to failure to thrive.  Patient was found to have pneumonia and acute DVT  Clinical Impression  Pt admitted with above diagnosis. Pt currently with functional limitations due to the deficits listed below (see PT Problem List). Pt will benefit from skilled PT to increase their independence and safety with mobility to allow discharge to the venue listed below.  Pt assisted with ambulating within room and required min assist and supplemental oxygen.  Pt is not safe to d/c home alone.  Per notes, family attempting to place in ALF.  If ALF not possible at this time, recommend SNF upon d/c.  Pt is high fall risk.     Follow Up Recommendations SNF    Equipment Recommendations  None recommended by PT    Recommendations for Other Services       Precautions / Restrictions Precautions Precautions: Fall Precaution Comments: monitor O2 Restrictions Weight Bearing Restrictions: No      Mobility  Bed Mobility Overal bed mobility: Needs Assistance Bed Mobility: Supine to Sit     Supine to sit: Min assist;HOB elevated     General bed mobility comments: pt in recliner on arrival    Transfers Overall transfer level: Needs assistance Equipment used: Rolling walker (2 wheeled) Transfers: Sit to/from Stand Sit to Stand: Min assist         General transfer comment: required assist to rise and steady as well as control descent; required 2 attempts, cues for weight shifting  Ambulation/Gait Ambulation/Gait assistance: Min guard Gait Distance (Feet): 14 Feet Assistive device: Rolling walker (2 wheeled) Gait Pattern/deviations: Step-through pattern;Decreased stride length     General Gait Details: verbal cues for RW positioning, pt reports  moderate dyspnea and fatigue with ambulating short distance, SpO2 dropped to 85% on room air so reapplied 2L O2 [place] and improved to 91%  Stairs            Wheelchair Mobility    Modified Rankin (Stroke Patients Only)       Balance Overall balance assessment: Needs assistance Sitting-balance support: Feet supported Sitting balance-Leahy Scale: Fair     Standing balance support: Bilateral upper extremity supported Standing balance-Leahy Scale: Poor Standing balance comment: reliant on external support                             Pertinent Vitals/Pain Pain Assessment: No/denies pain Pain Score: 5  Faces Pain Scale: Hurts a little bit Pain Location: left leg Pain Descriptors / Indicators: Aching Pain Intervention(s): Limited activity within patient's tolerance;Monitored during session    Home Living Family/patient expects to be discharged to:: Private residence Living Arrangements: Alone Available Help at Discharge: Family;Available PRN/intermittently;Friend(s) Type of Home: Apartment Home Access: Level entry     Home Layout: One level Home Equipment: Tub bench;Walker - 4 wheels;Grab bars - tub/shower      Prior Function Level of Independence: Needs assistance   Gait / Transfers Assistance Needed: ambulates with rollator  ADL's / Homemaking Assistance Needed: family/friends come over daily to assist bathing, dressing, meal prep, grocery shopping. pt states she could ambulate to bathroom on her own if family wasn't there  Comments: niece comes over daily but has not been enough due to patient's decline, family  is looking into transitioning patient to ALF     Hand Dominance   Dominant Hand: Right    Extremity/Trunk Assessment   Upper Extremity Assessment Upper Extremity Assessment: Generalized weakness    Lower Extremity Assessment Lower Extremity Assessment: Generalized weakness    Cervical / Trunk Assessment Cervical / Trunk Assessment:  Normal  Communication   Communication: No difficulties  Cognition Arousal/Alertness: Awake/alert Behavior During Therapy: WFL for tasks assessed/performed Overall Cognitive Status: No family/caregiver present to determine baseline cognitive functioning                                 General Comments: appropriate during session      General Comments General comments (skin integrity, edema, etc.): doffed O2 for toilet transfer, patient did desat to 87% after use of commode and transfer to recliner. Donned 2L and max cues for PLB, pt increase to 94%    Exercises     Assessment/Plan    PT Assessment Patient needs continued PT services  PT Problem List Decreased strength;Decreased mobility;Decreased activity tolerance;Decreased balance;Decreased knowledge of use of DME;Cardiopulmonary status limiting activity       PT Treatment Interventions DME instruction;Gait training;Balance training;Therapeutic exercise;Functional mobility training;Therapeutic activities;Patient/family education    PT Goals (Current goals can be found in the Care Plan section)  Acute Rehab PT Goals Patient Stated Goal: use commode PT Goal Formulation: With patient Time For Goal Achievement: 04/12/20 Potential to Achieve Goals: Good    Frequency Min 2X/week   Barriers to discharge Decreased caregiver support      Co-evaluation               AM-PAC PT "6 Clicks" Mobility  Outcome Measure Help needed turning from your back to your side while in a flat bed without using bedrails?: A Little Help needed moving from lying on your back to sitting on the side of a flat bed without using bedrails?: A Little Help needed moving to and from a bed to a chair (including a wheelchair)?: A Little Help needed standing up from a chair using your arms (e.g., wheelchair or bedside chair)?: A Little Help needed to walk in hospital room?: A Little Help needed climbing 3-5 steps with a railing? : A Lot 6  Click Score: 17    End of Session Equipment Utilized During Treatment: Gait belt Activity Tolerance: Patient tolerated treatment well Patient left: in chair;with call bell/phone within reach;with chair alarm set Nurse Communication: Mobility status PT Visit Diagnosis: Other abnormalities of gait and mobility (R26.89)    Time: 6160-7371 PT Time Calculation (min) (ACUTE ONLY): 19 min   Charges:   PT Evaluation $PT Eval Low Complexity: 1 Low        Kati PT, DPT Acute Rehabilitation Services Pager: [phone] Office: [phone]  York Ram E 03/29/2020, 12:57 PM

## 2020-03-30 ENCOUNTER — Ambulatory Visit: Payer: Medicare HMO

## 2020-03-30 DIAGNOSIS — I82403 Acute embolism and thrombosis of unspecified deep veins of lower extremity, bilateral: Secondary | ICD-10-CM

## 2020-03-30 LAB — CBC
HCT: 31.8 % — ABNORMAL LOW (ref 36.0–46.0)
Hemoglobin: 9.9 g/dL — ABNORMAL LOW (ref 12.0–15.0)
MCH: 24.6 pg — ABNORMAL LOW (ref 26.0–34.0)
MCHC: 31.1 g/dL (ref 30.0–36.0)
MCV: 78.9 fL — ABNORMAL LOW (ref 80.0–100.0)
Platelets: 167 10*3/uL (ref 150–400)
RBC: 4.03 MIL/uL (ref 3.87–5.11)
RDW: 15.7 % — ABNORMAL HIGH (ref 11.5–15.5)
WBC: 10.4 10*3/uL (ref 4.0–10.5)
nRBC: 0 % (ref 0.0–0.2)

## 2020-03-30 LAB — BASIC METABOLIC PANEL
Anion gap: 9 (ref 5–15)
BUN: 17 mg/dL (ref 8–23)
CO2: 23 mmol/L (ref 22–32)
Calcium: 8.7 mg/dL — ABNORMAL LOW (ref 8.9–10.3)
Chloride: 107 mmol/L (ref 98–111)
Creatinine, Ser: 0.96 mg/dL (ref 0.44–1.00)
GFR, Estimated: >60 mL/min (ref >60)
Glucose, Bld: 92 mg/dL (ref 70–99)
Potassium: 4.4 mmol/L (ref 3.5–5.1)
Sodium: 139 mmol/L (ref 135–145)

## 2020-03-30 LAB — GLUCOSE, CAPILLARY: Glucose-Capillary: 123 mg/dL — ABNORMAL HIGH (ref 70–99)

## 2020-03-30 LAB — MAGNESIUM: Magnesium: 2.2 mg/dL (ref 1.7–2.4)

## 2020-03-30 MED ORDER — HYDROCODONE-ACETAMINOPHEN 5-325 MG PO TABS: 1.0000 | ORAL_TABLET | Freq: Four times a day (QID) | ORAL | 0 refills | Status: AC | PRN

## 2020-03-30 MED ORDER — PROSOURCE PLUS PO LIQD: 30.0000 mL | Freq: Two times a day (BID) | ORAL | Status: DC

## 2020-03-30 MED ORDER — DEXAMETHASONE 4 MG PO TABS
4.0000 mg | ORAL_TABLET | Freq: Every day | ORAL | Status: DC
  Administered 2020-03-31 – 2020-04-08 (×9): 4 mg via ORAL
  Filled 2020-03-30 (×10): qty 1

## 2020-03-30 MED ORDER — POTASSIUM CHLORIDE 20 MEQ PO PACK: 20.0000 meq | PACK | Freq: Every day | ORAL | Status: DC

## 2020-03-30 MED ORDER — AMOXICILLIN-POT CLAVULANATE 875-125 MG PO TABS: 1.0000 | ORAL_TABLET | Freq: Two times a day (BID) | ORAL | Status: DC

## 2020-03-30 MED ORDER — ENSURE ENLIVE PO LIQD: 237.0000 mL | Freq: Two times a day (BID) | ORAL | 12 refills | Status: DC

## 2020-03-30 MED ORDER — DEXAMETHASONE 2 MG PO TABS: ORAL_TABLET | ORAL | Status: DC

## 2020-03-30 MED ORDER — RIVAROXABAN 15 MG PO TABS: 15.0000 mg | ORAL_TABLET | Freq: Two times a day (BID) | ORAL | Status: DC

## 2020-03-30 MED ORDER — ADULT MULTIVITAMIN W/MINERALS CH: 1.0000 | ORAL_TABLET | Freq: Every day | ORAL | Status: DC

## 2020-03-30 MED ORDER — POTASSIUM CHLORIDE 20 MEQ PO PACK
20.0000 meq | PACK | Freq: Every day | ORAL | Status: DC
  Administered 2020-03-31 – 2020-04-01 (×2): 20 meq via ORAL
  Filled 2020-03-30 (×2): qty 1

## 2020-03-30 MED ORDER — RIVAROXABAN 20 MG PO TABS: 20.0000 mg | ORAL_TABLET | Freq: Every day | ORAL | Status: DC

## 2020-03-30 MED ORDER — LEVALBUTEROL HCL 1.25 MG/0.5ML IN NEBU
1.2500 mg | INHALATION_SOLUTION | Freq: Four times a day (QID) | RESPIRATORY_TRACT | Status: DC | PRN
  Administered 2020-03-30 – 2020-04-08 (×4): 1.25 mg via RESPIRATORY_TRACT
  Filled 2020-03-30 (×5): qty 0.5

## 2020-03-30 NOTE — TOC Progression Note (Signed)
Transition of Care [facility]) - Progression Note    Patient Details  Name: Shaneeka Scarboro MRN: [id number] Date of Birth: 03-Jun-1945  [facility] CM/SW Contact  Purcell Mouton, RN Phone Number: 03/30/2020, 12:26 PM  Clinical Narrative:    Spoke with pt's daughter [name] this am concerning discharge plan. A second call to [name] was made to explain Important message for Medicare and Medicare Appeal process. Will mail all three forms to [facility] via mail and will try to e-mail [name] at [email or web address]. [name] feel that pt is not ready for discharge. When asked if this CM could continue with SNF placement.  [name] agreed to [facility], but ask that bed is put on  Hold because pt would not go today. A call was made to admission coordinator at [facility], pt is not Navi, facility will need to get insurance approval. Spoke with pt concerning discharge plans to SNF. Pt agreed to go with daughter's approval.  A call to [name] was made for her to speak with pt. TOC will continue to follow. Waiting for Appeal results.     Expected Discharge Plan: (P) Skilled Nursing Facility Barriers to Discharge: (P) Continued Medical Work up  Expected Discharge Plan and Services Expected Discharge Plan: (P) [place]       Living arrangements for the past 2 months: Single Family Home Expected Discharge Date: 03/30/20                                     Social Determinants of Health (SDOH) Interventions    Readmission Risk Interventions No flowsheet data found.

## 2020-03-30 NOTE — Care Management Important Message (Signed)
Important Message  Patient Details  Name: [name] MRN: [id number] Date of Birth: 01-19-1946   Medicare Important Message Given:  Yes    [employer] Appeal Detailed Notice of Discharge letter created and saved: Yes Detailed Notice of Discharge Document Given to Pateint: Yes (Delivered by Cookie M./ NCM) [employer] ROI Document Created: Yes [employer] appeal documents uploaded to [employer] stite: Yes  Kerin Salen 03/30/2020, 1:42 PM

## 2020-03-30 NOTE — Discharge Summary (Signed)
[facility]  Physician Discharge Summary   Patient ID: Amanda Rivers MRN: [id number] DOB/AGE: 22-May-1945 75 y.o.  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 Palliative care to follow at SNF 2. Check CBGs once daily 3. 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  Brought in due to failure to thrive.  Patient was found to have pneumonia.  Also found to have acute DVT.  Hospitalized for further management.     Consultations:  Pulmonology  Palliative care    HOSPITAL COURSE:   Left upper lobe pneumonia/sepsis POA/possible radiation pneumonitis Patient presented with tachycardia tachypnea leukocytosis, extensive infiltrate left upper lobe. Tuberculosis was a consideration.  Patient seen by pulmonology who did not think that this was tuberculosis.   Isolation precautions were discontinued. Patient was placed on ceftriaxone and azithromycin.  Changed over to Augmentin.  She will also complete a 5-day course of azithromycin. Patient started on steroids by pulmonology due to concern for radiation pneumonitis.  Continue dexamethasone with taper over 2 weeks.  AKI/normal anion gap metabolic  acidosis/hypokalemia and hypomagnesemia Baseline creatinine 1-1.2.  Presented with creatinine of 2.2.   Improved with IV hydration.    Potassium and magnesium was supplemented and are normal today.  Failure to thrive x3 weeks Patient has had poor oral intake.  Apparently has been going downhill since she was started on radiation treatment about a month ago.  Abdominal ultrasound was done which showed gallbladder sludge without any gallstones or hydronephrosis.  Continue to encourage oral intake.  PT and OT.     Oropharyngeal dysphagia Aspiration precautions.    Seen by speech therapy.  On dysphagia 2 diet with thin liquids.  Acute metabolic encephalopathy Likely due to metabolic derangements and dehydration.  Seems to be improved.    Acute bilateral lower extremity DVT Patient had bilateral leg pain and D-dimer more than 20 which prompted duplex of the leg which revealed bilateral DVT. Discussed w/ her oncologist Dr Chryl Heck, and also with patient's daughter. Initially treated with IV heparin.  Transitioned to rivaroxaban.  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 Isidore Moos and Dr. Chryl Heck. Last radiation 03/05/2020 and going down hill since.  Per oncology Dr. Chryl Heck- patient refused chemo and radiation, and refused further work-up of pulmonary nodules with bronchoscopy.  Palliative care was consulted here in the hospital.  Would recommend that they follow at SNF as well.   Thrombocytopenia Possibly due to consumption.  This has resolved.  Anemia of chronic disease Hemoglobin low but stable.  No overt bleeding noted.    New onset prediabetes w/ hyperglycemia HbA1c 6.3.  Elevated glucose levels could be due to steroids.    Monitor CBGs once daily.  Abnormal LFTs Abdominal ultrasound did not show any acute findings.  LFTs stable.  Check periodically.  Positive hepatitis C  antibody, likely false positive HCV quantitative test did not detect  any virus.  Goals of care Patient noted to be DNR/DNI after pulmonology discussed patient's condition with patient's family.  Overall stable.  Okay for discharge to SNF.  PERTINENT LABS:  The results of significant diagnostics from this hospitalization (including imaging, microbiology, ancillary and laboratory) are listed below for reference.    Microbiology: Recent Results (from the past 240 hour(s))  SARS Coronavirus 2 by RT PCR (hospital order, performed in [facility] hospital lab) Nasopharyngeal Nasopharyngeal Swab     Status: None   Collection Time: 03/25/20  2:17 PM   Specimen: Nasopharyngeal Swab  Result Value Ref Range Status   SARS Coronavirus 2 NEGATIVE NEGATIVE Final    Comment: (NOTE) SARS-CoV-2 target nucleic acids are NOT DETECTED.  The SARS-CoV-2 RNA is generally detectable in upper and lower respiratory specimens during the acute phase of infection. The lowest concentration of SARS-CoV-2 viral copies this assay can detect is 250 copies / mL. A negative result does not preclude SARS-CoV-2 infection and should not be used as the sole basis for treatment or other patient management decisions.  A negative result may occur with improper specimen collection / handling, submission of specimen other than nasopharyngeal swab, presence of viral mutation(s) within the areas targeted by this assay, and inadequate number of viral copies (<250 copies / mL). A negative result must be combined with clinical observations, patient history, and epidemiological information.  Fact Sheet for Patients:   StrictlyIdeas.no  Fact Sheet for Healthcare Providers: BankingDealers.co.za  This test is not yet approved or  cleared by the [place] FDA and has been authorized for detection and/or diagnosis of SARS-CoV-2 by FDA under an Emergency Use Authorization (EUA).  This EUA will remain in effect (meaning this test can be used) for the  duration of the COVID-19 declaration under Section 564(b)(1) of the Act, 21 U.S.C. section 360bbb-3(b)(1), unless the authorization is terminated or revoked sooner.  Performed at [facility], [place] [address], [place], [place] [address]   Urine culture     Status: Abnormal   Collection Time: 03/25/20  4:40 PM   Specimen: In/Out Cath Urine  Result Value Ref Range Status   Specimen Description   Final    IN/OUT CATH URINE Performed at [place] [address], [place], [place] [address]    Special Requests   Final    NONE Performed at [facility], [place] [address], [place], [place] [address]    Culture MULTIPLE SPECIES PRESENT, SUGGEST RECOLLECTION (A)  Final   Report Status 03/27/2020 FINAL  Final  Blood culture (routine single)     Status: None (Preliminary result)   Collection Time: 03/26/20  7:42 AM   Specimen: BLOOD  Result Value Ref Range Status   Specimen Description   Final    BLOOD BLOOD LEFT FOREARM Performed at [place] [address], [place], [place] [address]    Special Requests   Final    BOTTLES DRAWN AEROBIC ONLY Blood Culture results may not be optimal due to an inadequate volume of blood received in culture bottles Performed at [place] [address], [place],  [address]    Culture   Final    NO GROWTH 4 DAYS Performed at [facility], [place] [address], [place],  [address]    Report Status PENDING  Incomplete     Labs:  COVID-19 Labs  No  results for input(s): DDIMER, FERRITIN, LDH, CRP in the last 72 hours.  Lab Results  Component Value Date   [place] NEGATIVE 03/25/2020      Basic Metabolic Panel: Recent Labs  Lab 03/26/20 0424 03/27/20 0520 03/28/20 0525 03/29/20 0413 03/30/20 0439  NA 140 134* 140 138 139  K 3.9 3.7 3.4* 3.3* 4.4  CL 109 108 111 107 107  CO2 20* 19* 19* 20* 23  GLUCOSE 105* 106* 215*  103* 92  BUN 32* 22 18 17 17   CREATININE 1.33* 1.00 1.14* 0.88 0.96  CALCIUM 8.4* 7.9* 7.9* 8.2* 8.7*  MG  --   --   --  1.4* 2.2   Liver Function Tests: Recent Labs  Lab 03/25/20 1400 03/26/20 0424 03/27/20 0520 03/28/20 0525 03/29/20 0413  AST 74* 63* 69* 52* 84*  ALT 53* 50* 52* 45* 65*  ALKPHOS 74 66 73 63 61  BILITOT 0.6 0.6 0.3 0.2* 0.5  PROT 7.1 6.0* 5.8* 5.3* 5.3*  ALBUMIN 3.1* 2.7* 2.5* 2.2* 2.1*   CBC: Recent Labs  Lab 03/25/20 1248 03/26/20 0424 03/27/20 0520 03/28/20 0525 03/29/20 0413 03/30/20 0439  WBC 11.9* 10.2 9.5 8.0 8.6 10.4  NEUTROABS 10.4*  --   --   --   --   --   HGB 12.0 9.6* 9.2* 8.8* 8.3* 9.9*  HCT 38.6 30.8* 29.3* 28.8* 26.8* 31.8*  MCV 78.8* 78.0* 78.1* 79.6* 78.8* 78.9*  PLT 117* 99* 145* 160 157 167   Cardiac Enzymes: Recent Labs  Lab 03/25/20 1400  CKTOTAL 40    CBG: Recent Labs  Lab 03/27/20 1542  GLUCAP 123*     IMAGING STUDIES DG Lumbar Spine Complete  Result Date: 03/25/2020 CLINICAL DATA:  Fall. EXAM: LUMBAR SPINE - COMPLETE 4+ VIEW COMPARISON:  None. FINDINGS: Normal alignment. Negative for fracture. Mild disc degeneration. Facet degeneration L4-5 and L5-S1 Atherosclerotic calcification aorta and iliac arteries without aneurysm. IMPRESSION: Negative for fracture Electronically Signed   By: Franchot Gallo M.D.   On: 03/25/2020 15:27   US Abdomen Complete  Result Date: 03/25/2020 CLINICAL DATA:  Acute kidney injury and elevated LFTs. EXAM: ABDOMEN ULTRASOUND COMPLETE COMPARISON:  PET CT 02/04/2020 FINDINGS: Gallbladder: Physiologically distended. Layering sludge. No gallstones or wall thickening visualized. No sonographic Murphy sign noted by sonographer. Common bile duct: Diameter: 3 mm, normal. Liver: No focal lesion identified. Within normal limits in parenchymal echogenicity. Portal vein is patent on color Doppler imaging with normal direction of blood flow towards the liver. IVC: No abnormality visualized. Pancreas:  Visualized portion unremarkable. Spleen: Size and appearance within normal limits. Right Kidney: Length: 10.0 cm. Echogenicity within normal limits. No hydronephrosis. 1.5 cm cyst in the lower kidney. No solid mass or visualized renal calculi. Left Kidney: Length: 8.3 cm. Slight increased renal parenchymal echogenicity. No hydronephrosis. 1.3 cm cyst in the medial kidney. No solid lesion or visualized renal calculi. Abdominal aorta: No aneurysm visualized.  Atheromatous plaque. Other findings: No abdominal ascites. There is a left pleural effusion. IMPRESSION: 1. Gallbladder sludge. No gallstones or findings of acute cholecystitis. 2. No explanation for elevated LFTs.  No biliary dilatation. 3. No hydronephrosis or obstructive uropathy. Mild left renal atrophy and increased echogenicity. Incidental small bilateral renal cysts. 4. Left pleural effusion. Electronically Signed   By: Keith Rake M.D.   On: 03/25/2020 20:02   DG Chest Port 1 View  Result Date: 03/27/2020 CLINICAL DATA:  Chest pain and shortness of breath, diminished breath sounds by report. EXAM: PORTABLE CHEST  1 VIEW COMPARISON:  Multiple prior studies dating back to 2010, most recent March 25, 2020 FINDINGS: Accounting for differences in technique no interval change in the appearance of interstitial and airspace disease in the LEFT upper chest compared to the recent comparison study. Signs of emphysema in the background as before. Pleural and parenchymal scarring at the RIGHT lung base unchanged. Cardiomediastinal contours and hilar structures are stable with partially obscured LEFT hilar structures due to interstitial and airspace opacities. On limited assessment no acute skeletal process. IMPRESSION: No interval change in the appearance of the chest compared to the recent comparison study. Signs of emphysema with basilar scarring RIGHT greater than LEFT. Findings may represent infection as discussed previously. Note that this patient also  has a history of neoplasm. Lymphangitic carcinomatosis is another differential consideration. Would also correlate with whether there has been radiation therapy to this area that could also be a cause of pneumonitis. CT of the chest may be helpful for further evaluation. Electronically Signed   By: Zetta Bills M.D.   On: 03/27/2020 15:24   DG Chest Port 1 View  Result Date: 03/25/2020 CLINICAL DATA:  Fall EXAM: PORTABLE CHEST 1 VIEW COMPARISON:  06/10/2008 FINDINGS: Extensive airspace disease left upper lobe consistent with pneumonia. Underlying emphysema. Blunting right costophrenic angle unchanged compatible with effusion. Right lung otherwise clear Heart size and vascularity normal. Atherosclerotic calcification aortic arch. IMPRESSION: Extensive infiltrate left upper lobe consistent with pneumonia. Possible TB. Small right pleural effusion. Electronically Signed   By: Franchot Gallo M.D.   On: 03/25/2020 15:29   DG Hip Unilat With Pelvis 2-3 Views Left  Result Date: 03/25/2020 CLINICAL DATA:  Fall.  History of lung cancer EXAM: DG HIP (WITH OR WITHOUT PELVIS) 2-3V LEFT COMPARISON:  None. FINDINGS: There is no evidence of hip fracture or dislocation. There is no evidence of arthropathy or other focal bone abnormality. IMPRESSION: Negative. Electronically Signed   By: Franchot Gallo M.D.   On: 03/25/2020 15:26   DG Hip Unilat With Pelvis 2-3 Views Right  Result Date: 03/25/2020 CLINICAL DATA:  Fall.  Hip pain.  Lung cancer history EXAM: DG HIP (WITH OR WITHOUT PELVIS) 2-3V RIGHT COMPARISON:  None. FINDINGS: There is no evidence of hip fracture or dislocation. There is no evidence of arthropathy or other focal bone abnormality. IMPRESSION: Negative. Electronically Signed   By: Franchot Gallo M.D.   On: 03/25/2020 15:26   VAS [place] LOWER EXTREMITY VENOUS (DVT)  Result Date: 03/26/2020  Lower Venous DVT Study Indications: Pain.  Risk Factors: None identified. Comparison Study: No prior studies. Performing  Technologist: Oliver Hum RVT  Examination Guidelines: A complete evaluation includes B-mode imaging, spectral Doppler, color Doppler, and power Doppler as needed of all accessible portions of each vessel. Bilateral testing is considered an integral part of a complete examination. Limited examinations for reoccurring indications may be performed as noted. The reflux portion of the exam is performed with the patient in reverse Trendelenburg.  +---------+---------------+---------+-----------+----------+--------------+ RIGHT    CompressibilityPhasicitySpontaneityPropertiesThrombus Aging +---------+---------------+---------+-----------+----------+--------------+ CFV      None           No       No                   Acute          +---------+---------------+---------+-----------+----------+--------------+ SFJ      Full                                                        +---------+---------------+---------+-----------+----------+--------------+  FV Prox  None           No       No                   Acute          +---------+---------------+---------+-----------+----------+--------------+ FV Mid   None           No       No                   Acute          +---------+---------------+---------+-----------+----------+--------------+ FV DistalNone           No       No                   Acute          +---------+---------------+---------+-----------+----------+--------------+ PFV      None           No       No                   Acute          +---------+---------------+---------+-----------+----------+--------------+ POP      None           No       No                   Acute          +---------+---------------+---------+-----------+----------+--------------+ PTV      None                                         Acute          +---------+---------------+---------+-----------+----------+--------------+ PERO     Partial                                       Acute          +---------+---------------+---------+-----------+----------+--------------+ Gastroc  Full                                                        +---------+---------------+---------+-----------+----------+--------------+ EIV                     Yes      Yes                                 +---------+---------------+---------+-----------+----------+--------------+ CIV                     Yes      Yes                                 +---------+---------------+---------+-----------+----------+--------------+   +---------+---------------+---------+-----------+----------+--------------+ LEFT     CompressibilityPhasicitySpontaneityPropertiesThrombus Aging +---------+---------------+---------+-----------+----------+--------------+ CFV      Full           Yes      Yes                                 +---------+---------------+---------+-----------+----------+--------------+  SFJ      Full                                                        +---------+---------------+---------+-----------+----------+--------------+ FV Prox  Full                                                        +---------+---------------+---------+-----------+----------+--------------+ FV Mid   Full                                                        +---------+---------------+---------+-----------+----------+--------------+ FV DistalFull                                                        +---------+---------------+---------+-----------+----------+--------------+ PFV      Full                                                        +---------+---------------+---------+-----------+----------+--------------+ POP      None           No       No                   Acute          +---------+---------------+---------+-----------+----------+--------------+ PTV      None                                         Acute           +---------+---------------+---------+-----------+----------+--------------+ PERO     Partial                                      Acute          +---------+---------------+---------+-----------+----------+--------------+     Summary: RIGHT: - Findings consistent with acute deep vein thrombosis involving the right common femoral vein, right femoral vein, right proximal profunda vein, right popliteal vein, right posterior tibial veins, and right peroneal veins. - No cystic structure found in the popliteal fossa.  LEFT: - Findings consistent with acute deep vein thrombosis involving the left popliteal vein, left posterior tibial veins, and left peroneal veins. - No cystic structure found in the popliteal fossa.  *See table(s) above for measurements and observations. Electronically signed by Monica Martinez MD on 03/26/2020 at 4:07:53 PM.    Final     DISCHARGE EXAMINATION: Vitals:   03/29/20 0648 03/29/20 1500 03/29/20 2006 03/30/20 0547  BP: 118/74 117/72 116/83 94/79  Pulse: 97  95 98 98  Resp: 20 20 20 18   Temp: 98.6 F (37 C) 98.2 F (36.8 C) 98.9 F (37.2 C) 98.5 F (36.9 C)  TempSrc: Oral Oral Oral Oral  SpO2: 100% 100% 98% 100%  Weight:      Height:       General appearance: Awake alert.  In no distress.  Mildly distracted Resp: Normal effort.  Few crackles on the right.  No wheezing or rhonchi. Cardio: S1-S2 is normal regular.  No S3-S4.  No rubs murmurs or bruit GI: Abdomen is soft.  Nontender nondistended.  Bowel sounds are present normal.  No masses organomegaly    DISPOSITION: SNF  Discharge Instructions    Call MD for:  difficulty breathing, headache or visual disturbances   Complete by: As directed    Call MD for:  extreme fatigue   Complete by: As directed    Call MD for:  persistant dizziness or light-headedness   Complete by: As directed    Call MD for:  persistant nausea and vomiting   Complete by: As directed    Call MD for:  severe uncontrolled pain    Complete by: As directed    Call MD for:  temperature >100.4   Complete by: As directed    Discharge instructions   Complete by: As directed    Please review instructions on the discharge summary.  You were cared for by a hospitalist during your hospital stay. If you have any questions about your discharge medications or the care you received while you were in the hospital after you are discharged, you can call the unit and asked to speak with the hospitalist on call if the hospitalist that took care of you is not available. Once you are discharged, your primary care physician will handle any further medical issues. Please note that NO REFILLS for any discharge medications will be authorized once you are discharged, as it is imperative that you return to your primary care physician (or establish a relationship with a primary care physician if you do not have one) for your aftercare needs so that they can reassess your need for medications and monitor your lab values. If you do not have a primary care physician, you can call [phone] for a physician referral.   Increase activity slowly   Complete by: As directed         Allergies as of 03/30/2020   No Known Allergies     Medication List    STOP taking these medications   amLODipine-olmesartan 10-40 MG tablet Commonly known as: AZOR   nebivolol 10 MG tablet Commonly known as: BYSTOLIC   spironolactone 25 MG tablet Commonly known as: ALDACTONE     TAKE these medications   (feeding supplement) PROSource Plus liquid Take 30 mLs by mouth 2 (two) times daily between meals.   feeding supplement Liqd Take 237 mLs by mouth 2 (two) times daily between meals.   amoxicillin-clavulanate 875-125 MG tablet Commonly known as: AUGMENTIN Take 1 tablet by mouth every 12 (twelve) hours for 2 days.   dexamethasone 2 MG tablet Commonly known as: DECADRON Take 4mg  daily for 1 week and then 2mg  daily for 1 week and then STOP.   dexlansoprazole  60 MG capsule Commonly known as: DEXILANT Take 60 mg by mouth daily.   HYDROcodone-acetaminophen 5-325 MG tablet Commonly known as: NORCO/VICODIN Take 1 tablet by mouth every 6 (six) hours as needed for severe pain.   montelukast 10 MG tablet Commonly known  as: SINGULAIR Take 10 mg by mouth at bedtime.   multivitamin with minerals Tabs tablet Take 1 tablet by mouth daily. Start taking on: March 31, 2020   potassium chloride 20 MEQ packet Commonly known as: KLOR-CON Take 20 mEq by mouth daily. Start taking on: March 31, 2020   primidone 50 MG tablet Commonly known as: MYSOLINE Take 50 mg by mouth 3 (three) times daily.   Rivaroxaban 15 MG Tabs tablet Commonly known as: XARELTO Take 1 tablet (15 mg total) by mouth 2 (two) times daily with a meal for 21 days.   rivaroxaban 20 MG Tabs tablet Commonly known as: XARELTO Take 1 tablet (20 mg total) by mouth daily with supper. Start after completing the twice daily dosing for 3 weeks Start taking on: April 18, 2020   rosuvastatin 20 MG tablet Commonly known as: CRESTOR Take 20 mg by mouth daily.         Follow-up Information    Lucianne Lei, MD. Schedule an appointment as soon as possible for a visit in 1 week(s).   Specialty: Family Medicine Contact information: [address] [place] [address] [phone]               TOTAL DISCHARGE TIME: 88 minutes  [place]  [facility] Pager on [email or web address]  03/30/2020, 11:44 AM

## 2020-03-30 NOTE — Care Management Important Message (Signed)
Important Message  Patient Details IM Letter scanned and emailed to Patients daughter Paulita Licklider. Name: Amanda Rivers MRN: [id number] Date of Birth: 1945/06/21   Medicare Important Message Given:  Yes     Kerin Salen 03/30/2020, 11:28 AM

## 2020-03-30 NOTE — Progress Notes (Signed)
[employer] [place] [facility]) Community Based Palliative Care       This patient has been referred to our palliative care services in the community.  [facility] will continue to follow for any discharge planning needs and to coordinate admission onto palliative care.    Thank you for the opportunity to participate in this patient's care.     Domenic Moras, BSN, RN [facility] Liaison   [phone]

## 2020-03-30 NOTE — Plan of Care (Signed)

## 2020-03-30 NOTE — NC FL2 (Signed)
[place] LEVEL OF CARE SCREENING TOOL     IDENTIFICATION  Patient Name: Amanda Rivers Birthdate: July 31, 1945 Sex: female Admission Date (Current Location): 03/25/2020  [facility] and [place] Number:  Herbalist and Address:  [facility],  [place], [place]      Provider Number: [id number]  Attending Physician Name and Address:  Bonnielee Haff, MD  Relative Name and Phone Number:  Antania Hoefling daughter [phone]    Current Level of Care: Hospital Recommended Level of Care: [place] Prior Approval Number:    Date Approved/Denied:   PASRR Number: [id number]  Discharge Plan: SNF    Current Diagnoses: Patient Active Problem List   Diagnosis Date Noted  . Community acquired pneumonia of left lower lobe of lung   . Shortness of breath   . Palliative care by specialist   . Goals of care, counseling/discussion   . General weakness   . Pneumonia 03/25/2020  . 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 Tremor 06/02/2019  . HTN (hypertension) 06/02/2019  . HLD (hyperlipidemia) 06/02/2019  . History of lung cancer 06/02/2019  . Chronic hyponatremia 06/02/2019  . GERD (gastroesophageal reflux disease) 06/02/2019  . Allergic rhinitis 06/02/2019    Orientation RESPIRATION BLADDER Height & Weight     Self,Time,Place,Situation  O2 (1L O2) Continent Weight: 66.1 kg Height:  5' 11.5" (181.6 cm)  BEHAVIORAL SYMPTOMS/MOOD NEUROLOGICAL BOWEL NUTRITION STATUS      Incontinent Diet (Dysphagia)  AMBULATORY STATUS COMMUNICATION OF NEEDS Skin   Extensive Assist   Normal                       Personal Care Assistance Level of Assistance  Bathing,Feeding,Dressing Bathing Assistance: Maximum assistance Feeding assistance: Independent Dressing Assistance: Maximum assistance     Functional Limitations Info  Sight,Speech,Hearing Sight Info:  Adequate Hearing Info: Adequate Speech Info: Adequate    SPECIAL CARE FACTORS FREQUENCY                       Contractures Contractures Info: Not present    Additional Factors Info  Code Status,Allergies Code Status Info: DNR Allergies Info: No Known Allergies           Current Medications (03/30/2020):  This is the current hospital active medication list Current Facility-Administered Medications  Medication Dose Route Frequency Provider Last Rate Last Admin  . (feeding supplement) PROSource Plus liquid 30 mL  30 mL Oral BID BM Kc, Ramesh, MD   30 mL at 03/30/20 0900  . acetaminophen (TYLENOL) tablet 650 mg  650 mg Oral Q6H PRN Marylyn Ishihara, Tyrone A, DO   650 mg at 03/28/20 1735   Or  . acetaminophen (TYLENOL) suppository 650 mg  650 mg Rectal Q6H PRN Marylyn Ishihara, Tyrone A, DO      . amoxicillin-clavulanate (AUGMENTIN) 875-125 MG per tablet 1 tablet  1 tablet Oral Q12H Bonnielee Haff, MD   1 tablet at 03/30/20 0857  . [START ON 03/31/2020] dexamethasone (DECADRON) tablet 4 mg  4 mg Oral Daily Bonnielee Haff, MD      . feeding supplement (BOOST / RESOURCE BREEZE) liquid 1 Container  1 Container Oral Q24H Antonieta Pert, MD   1 Container at 03/30/20 [phone]  . feeding supplement (ENSURE ENLIVE / ENSURE PLUS) liquid 237 mL  237 mL Oral BID BM Kc, Ramesh, MD   237 mL at 03/29/20  1945  . HYDROcodone-acetaminophen (NORCO/VICODIN) 5-325 MG per tablet 1-2 tablet  1-2 tablet Oral Q4H PRN Marylyn Ishihara, Tyrone A, DO   1 tablet at 03/26/20 1310  . montelukast (SINGULAIR) tablet 10 mg  10 mg Oral QHS Antonieta Pert, MD   10 mg at 03/29/20 2157  . multivitamin with minerals tablet 1 tablet  1 tablet Oral Daily Antonieta Pert, MD   1 tablet at 03/30/20 0857  . pantoprazole (PROTONIX) EC tablet 40 mg  40 mg Oral Daily Kc, Ramesh, MD   40 mg at 03/30/20 0857  . [START ON 03/31/2020] potassium chloride (KLOR-CON) packet 20 mEq  20 mEq Oral Daily Bonnielee Haff, MD      . primidone (MYSOLINE) tablet 50 mg  50 mg Oral TID Antonieta Pert, MD   50 mg at 03/30/20 0858  . Rivaroxaban (XARELTO) tablet 15 mg  15 mg Oral BID WC Efraim Kaufmann, RPH   15 mg at 03/30/20 0859   Followed by  . [START ON 04/18/2020] rivaroxaban (XARELTO) tablet 20 mg  20 mg Oral Q supper Efraim Kaufmann, RPH      . rosuvastatin (CRESTOR) tablet 20 mg  20 mg Oral Daily Kc, Ramesh, MD   20 mg at 03/30/20 0900     Discharge Medications: Please see discharge summary for a list of discharge medications.  Relevant Imaging Results:  Relevant Lab Results:   Additional Information SS#[id number]  Purcell Mouton, RN

## 2020-03-31 ENCOUNTER — Ambulatory Visit: Payer: Medicare HMO

## 2020-03-31 DIAGNOSIS — R000 Tachycardia, unspecified: Secondary | ICD-10-CM

## 2020-03-31 DIAGNOSIS — J700 Acute pulmonary manifestations due to radiation: Secondary | ICD-10-CM | POA: Diagnosis not present

## 2020-03-31 DIAGNOSIS — J189 Pneumonia, unspecified organism: Secondary | ICD-10-CM | POA: Diagnosis not present

## 2020-03-31 LAB — BASIC METABOLIC PANEL
Anion gap: 11 (ref 5–15)
BUN: 16 mg/dL (ref 8–23)
CO2: 22 mmol/L (ref 22–32)
Calcium: 8.7 mg/dL — ABNORMAL LOW (ref 8.9–10.3)
Chloride: 105 mmol/L (ref 98–111)
Creatinine, Ser: 0.91 mg/dL (ref 0.44–1.00)
GFR, Estimated: >60 mL/min (ref >60)
Glucose, Bld: 107 mg/dL — ABNORMAL HIGH (ref 70–99)
Potassium: 3.4 mmol/L — ABNORMAL LOW (ref 3.5–5.1)
Sodium: 138 mmol/L (ref 135–145)

## 2020-03-31 LAB — CULTURE, BLOOD (SINGLE): Culture: NO GROWTH

## 2020-03-31 LAB — CBC
HCT: 31.4 % — ABNORMAL LOW (ref 36.0–46.0)
Hemoglobin: 10.1 g/dL — ABNORMAL LOW (ref 12.0–15.0)
MCH: 24.8 pg — ABNORMAL LOW (ref 26.0–34.0)
MCHC: 32.2 g/dL (ref 30.0–36.0)
MCV: 77 fL — ABNORMAL LOW (ref 80.0–100.0)
Platelets: 162 10*3/uL (ref 150–400)
RBC: 4.08 MIL/uL (ref 3.87–5.11)
RDW: 15.7 % — ABNORMAL HIGH (ref 11.5–15.5)
WBC: 9.2 10*3/uL (ref 4.0–10.5)
nRBC: 0 % (ref 0.0–0.2)

## 2020-03-31 MED ORDER — NEBIVOLOL HCL 5 MG PO TABS
5.0000 mg | ORAL_TABLET | Freq: Every day | ORAL | Status: DC
  Administered 2020-03-31: 5 mg via ORAL
  Filled 2020-03-31: qty 1

## 2020-03-31 MED ORDER — NEBIVOLOL HCL 10 MG PO TABS
10.0000 mg | ORAL_TABLET | Freq: Every day | ORAL | Status: DC
  Administered 2020-04-01 – 2020-04-09 (×9): 10 mg via ORAL
  Filled 2020-03-31 (×9): qty 1

## 2020-03-31 MED ORDER — NEBIVOLOL HCL 2.5 MG PO TABS
2.5000 mg | ORAL_TABLET | Freq: Every day | ORAL | Status: DC
  Filled 2020-03-31: qty 1

## 2020-03-31 MED ORDER — MORPHINE SULFATE (PF) 2 MG/ML IV SOLN
2.0000 mg | INTRAVENOUS | Status: AC
  Administered 2020-03-31: 2 mg via INTRAVENOUS
  Filled 2020-03-31: qty 1

## 2020-03-31 NOTE — Progress Notes (Signed)
Pt called out stating she could not breath and would like to call daughter as well as sit in chair. RN assessed Pt Pt 02 sats were 98 percent on RA. Pt then stated she would like to stay in bed. While in bed Pt brushed teeth and washed face. 2mg  of morphine given to help with air hunger and anxiety.

## 2020-03-31 NOTE — Progress Notes (Signed)
PROGRESS NOTE    Amanda Rivers  WUJ:[id number] DOB: September 11, 1945 DOA: 03/25/2020 PCP: Lucianne Lei, MD   Chief Complaint  Patient presents with  . 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  Brought in due to failure to thrive.  Patient was found to have pneumonia.  Also found to have acute DVT.  Hospitalized for further management.     Subjective: Overnight patient apparently got very agitated and anxious.  Apparently had a panic attack.  This morning she is trying to get out of her bed.  States that she does not have any pain but just feels anxious.  Denies any chest pain shortness of breath.  No nausea vomiting.   Assessment & Plan:  Left upper lobe pneumonia/sepsis POA/possible radiation pneumonitis Patient presented with tachycardia tachypnea leukocytosis, extensive infiltrate left upper lobe. Tuberculosis was a consideration.  Patient seen by pulmonology who did not think that this was tuberculosis.   Isolation precautions were discontinued. Patient was placed on ceftriaxone and azithromycin.  Changed over to Augmentin.  Patient also completed a 5-day course of azithromycin. Patient remains afebrile.  WBC is normal. Patient started on steroids by pulmonology due to concern for radiation pneumonitis.  Continue dexamethasone with taper over 2 weeks.  AKI/normal anion gap metabolic acidosis/hypokalemia and hypomagnesemia Baseline creatinine 1-1.2.  Presented with creatinine of 2.2.  Improved with hydration.  Stable today.  Replace potassium.  Sinus tachycardia Noted to be tachycardic this morning with rate sometimes going into the 140s.  Telemetry shows sinus tachycardia.  Likely due to her anxiety.  Patient denies any chest pain or shortness of breath.  Vital signs otherwise are stable.  Home medication list did show Bystolic.  These medications were held initially due to  hypotension.  Some of her tachycardia could be rebound tachycardia.  We will put her back on a lower dose of Bystolic.  Failure to thrive x3 weeks Patient has had poor oral intake.  Apparently has been going downhill since she was started on radiation treatment about a month ago.  Abdominal ultrasound was done which showed gallbladder sludge without any gallstones or hydronephrosis.  Continue to encourage oral intake.  PT and OT.  Palliative care to follow at skilled nursing facility.   Oropharyngeal dysphagia Aspiration precautions.  Seen by speech therapy.  On dysphagia two diet with thin liquids.  Acute metabolic encephalopathy Likely due to metabolic derangements and dehydration.  Seems to be anxious this morning.  Continue supportive care.  Reassurance.  Acute bilateral lower extremity DVT Patient had bilateral leg pain and D-dimer more than 20 which prompted duplex of the leg which revealed bilateral DVT. Discussed w/ her oncologist Dr Chryl Heck, and also with patient's daughter. Patient was initially treated with IV heparin.  Subsequently transitioned to rivaroxaban.  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 Isidore Moos and Dr. Chryl Heck. Last radiation 03/05/2020 and going down hill since.  Per oncology Dr. Chryl Heck- patient refused chemo and radiation, and refused further work-up of pulmonary nodules with bronchoscopy.  Palliative care following closely.    Thrombocytopenia Possibly due to consumption.  This has resolved.  Anemia of chronic disease Hemoglobin low but stable.  No overt bleeding noted.    New onset prediabetes w/ hyperglycemia HbA1c 6.3.  Elevated glucose levels could be due to steroids.  Monitor CBGs periodically.  Abnormal LFTs Abdominal ultrasound did not show any  acute findings.  LFTs were stable.  Check periodically.  Positive hepatitis C antibody, likely false positive HCV quantitative test did not detect any virus.  Goals  of care Palliative care is following.  Patient noted to be DNR/DNI after pulmonology discussed patient's condition with patient's family.  Patient was discharged yesterday to SNF however daughter is appealing the discharge.  DVT prophylaxis: On rivaroxaban therapeutically Code Status: DNR Family Communication: Daughter was updated yesterday. Disposition: We will need PT and OT evaluation.  May need placement.  Status is: Inpatient Remains inpatient appropriate because:IV treatments appropriate due to intensity of illness or inability to take PO and For ongoing management of DVT, failure to thrive renal failure, pneumonia  Dispo:The patient is from: Home lives alone, daughter lives 2 hrs away.            Anticipated d/c is to: SNF            Anticipated d/c date is: 1 to 2 days              Consultants: Pulmonology Procedures: None yet  Culture/Microbiology    Component Value Date/Time   SDES  03/26/2020 0742    BLOOD BLOOD LEFT FOREARM Performed at [facility] [address] [place], [place] [address]    SPECREQUEST  03/26/2020 [phone]    BOTTLES DRAWN AEROBIC ONLY Blood Culture results may not be optimal due to an inadequate volume of blood received in culture bottles Performed at [facility], [place] [address], [place], [place] [address]    CULT  03/26/2020 [phone]    NO GROWTH 5 DAYS Performed at [facility], [place] [address], [place],  [address]    REPTSTATUS 03/31/2020 FINAL 03/26/2020 0272    Other culture-see note  Medications: Scheduled Meds: . (feeding supplement) PROSource Plus  30 mL Oral BID BM  . amoxicillin-clavulanate  1 tablet Oral Q12H  . dexamethasone  4 mg Oral Daily  . feeding supplement  1 Container Oral Q24H  . feeding supplement  237 mL Oral BID BM  . montelukast  10 mg Oral QHS  . multivitamin with minerals  1 tablet Oral Daily  . nebivolol  5 mg Oral Daily  . pantoprazole  40 mg Oral Daily  .  potassium chloride  20 mEq Oral Daily  . primidone  50 mg Oral TID  . Rivaroxaban  15 mg Oral BID WC   Followed by  . [START ON 04/18/2020] rivaroxaban  20 mg Oral Q supper  . rosuvastatin  20 mg Oral Daily   Continuous Infusions:   Antimicrobials: Anti-infectives (From admission, onward)   Start     Dose/Rate Route Frequency Ordered Stop   03/30/20 0000  amoxicillin-clavulanate (AUGMENTIN) 875-125 MG tablet        1 tablet Oral Every 12 hours 03/30/20 0906 04/01/20 2359   03/29/20 1000  amoxicillin-clavulanate (AUGMENTIN) 875-125 MG per tablet 1 tablet        1 tablet Oral Every 12 hours 03/28/20 1020 04/01/20 0901   03/29/20 1000  azithromycin (ZITHROMAX) tablet 500 mg        500 mg Oral Daily 03/28/20 1738 03/30/20 0857   03/26/20 1000  cefTRIAXone (ROCEPHIN) 2 g in sodium chloride 0.9 % 100 mL IVPB  Status:  Discontinued        2 g 200 mL/hr over 30 Minutes Intravenous Every 24 hours 03/25/20 1850 03/28/20 1020   03/26/20 1000  azithromycin (ZITHROMAX) 500 mg in sodium  chloride 0.9 % 250 mL IVPB  Status:  Discontinued        500 mg 250 mL/hr over 60 Minutes Intravenous Every 24 hours 03/25/20 1850 03/28/20 1738   03/25/20 1615  azithromycin (ZITHROMAX) 500 mg in sodium chloride 0.9 % 250 mL IVPB        500 mg 250 mL/hr over 60 Minutes Intravenous  Once 03/25/20 1609 03/25/20 1941   03/25/20 1400  cefTRIAXone (ROCEPHIN) 1 g in sodium chloride 0.9 % 100 mL IVPB        1 g 200 mL/hr over 30 Minutes Intravenous  Once 03/25/20 1355 03/25/20 1636     Objective: Vitals: Today's Vitals   03/30/20 1422 03/30/20 1908 03/31/20 0525 03/31/20 0621  BP: 132/77  (!) 151/92   Pulse: (!) 108  (!) 122   Resp: 18  20 17   Temp: 99.2 F (37.3 C)  99.1 F (37.3 C)   TempSrc: Oral  Oral   SpO2: 90%  97%   Weight:      Height:      PainSc:  0-No pain      Intake/Output Summary (Last 24 hours) at 03/31/2020 1037 Last data filed at 03/30/2020 2122 Gross per 24 hour  Intake 120 ml  Output  400 ml  Net -280 ml   Filed Weights   03/25/20 1330 03/25/20 2155  Weight: 66.2 kg 66.1 kg   Weight change:   Intake/Output from previous day: 02/08 0701 - 02/09 0700 In: 360 [P.O.:360] Out: 400 [Urine:400] Intake/Output this shift: No intake/output data recorded. Filed Weights   03/25/20 1330 03/25/20 2155  Weight: 66.2 kg 66.1 kg    Examination:  General appearance: Awake alert anxious.  Distracted. Resp: Clear to auscultation bilaterally.  Normal effort Cardio: S1-S2 is tachycardic regular.  No S3-S4.  Systolic murmur over the precordium. GI: Abdomen is soft.  Nontender nondistended.  Bowel sounds are present normal.  No masses organomegaly Extremities: No edema.  She is noted to be moving all of her extremities Neurologic:  No focal neurological deficits.      Data Reviewed: I have personally reviewed following labs and imaging studies CBC: Recent Labs  Lab 03/25/20 1248 03/26/20 0424 03/27/20 0520 03/28/20 0525 03/29/20 0413 03/30/20 0439 03/31/20 0447  WBC 11.9*   < > 9.5 8.0 8.6 10.4 9.2  NEUTROABS 10.4*  --   --   --   --   --   --   HGB 12.0   < > 9.2* 8.8* 8.3* 9.9* 10.1*  HCT 38.6   < > 29.3* 28.8* 26.8* 31.8* 31.4*  MCV 78.8*   < > 78.1* 79.6* 78.8* 78.9* 77.0*  PLT 117*   < > 145* 160 157 167 162   < > = values in this interval not displayed.   Basic Metabolic Panel: Recent Labs  Lab 03/27/20 0520 03/28/20 0525 03/29/20 0413 03/30/20 0439 03/31/20 0447  NA 134* 140 138 139 138  K 3.7 3.4* 3.3* 4.4 3.4*  CL 108 111 107 107 105  CO2 19* 19* 20* 23 22  GLUCOSE 106* 215* 103* 92 107*  BUN 22 18 17 17 16   CREATININE 1.00 1.14* 0.88 0.96 0.91  CALCIUM 7.9* 7.9* 8.2* 8.7* 8.7*  MG  --   --  1.4* 2.2  --    GFR: Estimated Creatinine Clearance: 56.6 mL/min (by C-G formula based on SCr of 0.91 mg/dL).  Liver Function Tests: Recent Labs  Lab 03/25/20 1400 03/26/20 0424 03/27/20 0520 03/28/20  0525 03/29/20 0413  AST 74* 63* 69* 52* 84*   ALT 53* 50* 52* 45* 65*  ALKPHOS 74 66 73 63 61  BILITOT 0.6 0.6 0.3 0.2* 0.5  PROT 7.1 6.0* 5.8* 5.3* 5.3*  ALBUMIN 3.1* 2.7* 2.5* 2.2* 2.1*   Coagulation Profile: Recent Labs  Lab 03/25/20 1248 03/26/20 0424  INR 1.7* 1.6*   Cardiac Enzymes: Recent Labs  Lab 03/25/20 1400  CKTOTAL 40   Sepsis Labs: Recent Labs  Lab 03/25/20 2010 03/25/20 2235 03/26/20 0424 03/27/20 1800 03/27/20 2006  PROCALCITON  --   --  0.57  --   --   LATICACIDVEN 1.1 1.0  --  1.2 1.3    Recent Results (from the past 240 hour(s))  SARS Coronavirus 2 by RT PCR (hospital order, performed in [place] hospital lab) Nasopharyngeal Nasopharyngeal Swab     Status: None   Collection Time: 03/25/20  2:17 PM   Specimen: Nasopharyngeal Swab  Result Value Ref Range Status   SARS Coronavirus 2 NEGATIVE NEGATIVE Final    Comment: (NOTE) SARS-CoV-2 target nucleic acids are NOT DETECTED.  The SARS-CoV-2 RNA is generally detectable in upper and lower respiratory specimens during the acute phase of infection. The lowest concentration of SARS-CoV-2 viral copies this assay can detect is 250 copies / mL. A negative result does not preclude SARS-CoV-2 infection and should not be used as the sole basis for treatment or other patient management decisions.  A negative result may occur with improper specimen collection / handling, submission of specimen other than nasopharyngeal swab, presence of viral mutation(s) within the areas targeted by this assay, and inadequate number of viral copies (<250 copies / mL). A negative result must be combined with clinical observations, patient history, and epidemiological information.  Fact Sheet for Patients:   StrictlyIdeas.no  Fact Sheet for Healthcare Providers: BankingDealers.co.za  This test is not yet approved or  cleared by the [place] FDA and has been authorized for detection and/or diagnosis of SARS-CoV-2  by FDA under an Emergency Use Authorization (EUA).  This EUA will remain in effect (meaning this test can be used) for the duration of the COVID-19 declaration under Section 564(b)(1) of the Act, 21 U.S.C. section 360bbb-3(b)(1), unless the authorization is terminated or revoked sooner.  Performed at [facility], [place] [address] [place], [place] [address]   Urine culture     Status: Abnormal   Collection Time: 03/25/20  4:40 PM   Specimen: In/Out Cath Urine  Result Value Ref Range Status   Specimen Description   Final    IN/OUT CATH URINE Performed at [facility] [address] [place], [place] [address]    Special Requests   Final    NONE Performed at [facility], [place] [address], [place], [place] [address]    Culture MULTIPLE SPECIES PRESENT, SUGGEST RECOLLECTION (A)  Final   Report Status 03/27/2020 FINAL  Final  Blood culture (routine single)     Status: None   Collection Time: 03/26/20  7:42 AM   Specimen: BLOOD  Result Value Ref Range Status   Specimen Description   Final    BLOOD BLOOD LEFT FOREARM Performed at [facility] [address], [place], [place] [address]    Special Requests   Final    BOTTLES DRAWN AEROBIC ONLY Blood Culture results may not be optimal due to an inadequate volume of blood received in culture bottles Performed at [facility],  [place] [address] [place], [place] [address]    Culture   Final    NO GROWTH 5 DAYS Performed at [facility], [place] [address] [place], [place] [address]    Report Status 03/31/2020 FINAL  Final     Radiology Studies: No results found.   LOS: 6 days   Bonnielee Haff, MD [facility]  03/31/2020, 10:37 AM

## 2020-03-31 NOTE — TOC Progression Note (Signed)
Transition of Care [facility]) - Progression Note    Patient Details  Name: Amanda Rivers MRN: [id number] Date of Birth: [birth year]  Transition of Care [facility]) CM/SW Contact  Purcell Mouton, RN Phone Number: 03/31/2020, 11:33 AM  Clinical Narrative:    [name] APPEALED discharge on yesterday, 03/30/20. Waiting for APPEAL results from CMS. Cannot discharge until results are given.    Expected Discharge Plan: (P) Skilled Nursing Facility Barriers to Discharge: (P) Continued Medical Work up  Expected Discharge Plan and Services Expected Discharge Plan: (P) [place]       Living arrangements for the past 2 months: Single Family Home Expected Discharge Date: 03/30/20                                     Social Determinants of Health (SDOH) Interventions    Readmission Risk Interventions No flowsheet data found.

## 2020-03-31 NOTE — Plan of Care (Signed)

## 2020-03-31 NOTE — TOC Progression Note (Signed)
Transition of Care [facility]) - Progression Note    Patient Details  Name: Amanda Rivers MRN: [id number] Date of Birth: 1945/09/22  [facility] Care [facility]) CM/SW Contact  Purcell Mouton, RN Phone Number: 03/31/2020, 5:23 PM  Clinical Narrative:    Spoke with pt's daughter to remind her that pt was discharge and accruing a bill.  Explained that the facility in [place] did not respond to fax or telephone calls. Encouraged daughter to make a decision on a facility.  Per MD pt is ready for discharge and the appeal was denied.   Expected Discharge Plan: (P) Skilled Nursing Facility Barriers to Discharge: (P) Continued Medical Work up  Expected Discharge Plan and Services Expected Discharge Plan: (P) [place]       Living arrangements for the past 2 months: Single Family Home Expected Discharge Date: 03/30/20                                     Social Determinants of Health (SDOH) Interventions    Readmission Risk Interventions No flowsheet data found.

## 2020-03-31 NOTE — TOC Progression Note (Signed)
Transition of Care [facility]) - Progression Note    Patient Details  Name: Earl Zellmer MRN: [id number] Date of Birth: [birth year]  Transition of Care [facility]) CM/SW Contact  Purcell Mouton, RN Phone Number: 03/31/2020, 1:28 PM  Clinical Narrative:     Spoke with pt's daughter [name] to inform her that the Appeal was declined. [name] called her daughter who asked that pt be faxed to [facility] [place]. This was confirmed by a second call to pt's daughter [name] and her daughter. A call was made to [facility] [phone] to [place] Admission Coordinator. Fax number [phone] was given. Will fax clinicals to Admission Coordinator.  Expected Discharge Plan: (P) Skilled Nursing Facility Barriers to Discharge: (P) Continued Medical Work up  Expected Discharge Plan and Services Expected Discharge Plan: (P) [place]       Living arrangements for the past 2 months: Single Family Home Expected Discharge Date: 03/30/20                                     Social Determinants of Health (SDOH) Interventions    Readmission Risk Interventions No flowsheet data found.

## 2020-03-31 NOTE — Progress Notes (Signed)
GREEN-YELLOW MEWS SCORE: throughout the shift without significant change. Pt in chair most of day, eating meals and drinking protein supplements. Pt like the BOOST BREEZE in berry and peach. Refused Ensure supplements. VS unchanged and noted in computer. SRP, RN

## 2020-04-01 ENCOUNTER — Ambulatory Visit: Payer: Medicare HMO

## 2020-04-01 LAB — BASIC METABOLIC PANEL
Anion gap: 14 (ref 5–15)
BUN: 12 mg/dL (ref 8–23)
CO2: 22 mmol/L (ref 22–32)
Calcium: 8.6 mg/dL — ABNORMAL LOW (ref 8.9–10.3)
Chloride: 101 mmol/L (ref 98–111)
Creatinine, Ser: 0.79 mg/dL (ref 0.44–1.00)
GFR, Estimated: >60 mL/min (ref >60)
Glucose, Bld: 111 mg/dL — ABNORMAL HIGH (ref 70–99)
Potassium: 3.3 mmol/L — ABNORMAL LOW (ref 3.5–5.1)
Sodium: 137 mmol/L (ref 135–145)

## 2020-04-01 NOTE — TOC Progression Note (Signed)
Transition of Care [facility]) - Progression Note    Patient Details  Name: Amanda Rivers MRN: [id number] Date of Birth: 09/09/1945  Transition of Care [facility]) CM/SW Contact  Purcell Mouton, RN Phone Number: 04/01/2020, 1:47 PM  Clinical Narrative:     [facility] offered pt a bed. Spoke with pt's daughter who states that Judithann Graves called her and she won [employer]. A look back on the denial letter it was noted that was from [place]. Will continue to wait for confirmation from [facility].   Expected Discharge Plan: (P) Skilled Nursing Facility Barriers to Discharge: (P) Continued Medical Work up  Expected Discharge Plan and Services Expected Discharge Plan: (P) [place]       Living arrangements for the past 2 months: Single Family Home Expected Discharge Date: 03/30/20                                     Social Determinants of Health (SDOH) Interventions    Readmission Risk Interventions No flowsheet data found.

## 2020-04-01 NOTE — Discharge Summary (Signed)
[facility]  Physician Discharge Summary   Patient ID: Amanda Rivers MRN: [id number] DOB/AGE: 26-Mar-1945 75 y.o.  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 Palliative care to follow at SNF 2. Check CBGs once daily 3. 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  Brought in due to failure to thrive.  Patient was found to have pneumonia.  Also found to have acute DVT.  Hospitalized for further management.     Consultations:  Pulmonology  Palliative care    HOSPITAL COURSE:   Left upper lobe pneumonia/sepsis POA/possible radiation pneumonitis Patient presented with tachycardia tachypnea leukocytosis, extensive infiltrate left upper lobe. Tuberculosis was a consideration.  Patient seen by pulmonology who did not think that this was tuberculosis.   Isolation precautions were discontinued. Patient was placed on ceftriaxone and azithromycin.  Changed over to Augmentin.  She will also complete a 5-day course of azithromycin. Patient started on steroids by pulmonology due to concern for radiation pneumonitis.  Continue dexamethasone with taper over 2 weeks.  AKI/normal anion gap metabolic  acidosis/hypokalemia and hypomagnesemia Baseline creatinine 1-1.2.  Presented with creatinine of 2.2.   Improved with IV hydration.    Potassium and magnesium was supplemented and are normal today.  Sinus tachycardia Noted to be tachycardic yesterday morning with heart rate sometimes going into the 140s.  Telemetry showed sinus tachycardia.  Likely due to her anxiety.    Other vital signs were stable.  Patient was noted to be on Bystolic prior to admission.  These medications were held due to hypotension initially in the hospital.  Some of her tachycardia could have been due to rebound tachycardia.  She was placed back on Bystolic with improvement in heart rate.    Failure to thrive x3 weeks Patient has had poor oral intake.  Apparently has been going downhill since she was started on radiation treatment about a month ago.  Abdominal ultrasound was done which showed gallbladder sludge without any gallstones or hydronephrosis.  Continue to encourage oral intake.  PT and OT.     Oropharyngeal dysphagia Aspiration precautions.    Seen by speech therapy.  On dysphagia 2 diet with thin liquids.  Acute metabolic encephalopathy Likely due to metabolic derangements and dehydration.  Seems to be improved.    Acute bilateral lower extremity DVT Patient had bilateral leg pain and D-dimer more than 20 which prompted duplex of the leg which revealed bilateral DVT. Discussed w/ her oncologist Dr Chryl Heck, and also with patient's daughter. Initially treated with IV heparin.  Transitioned to rivaroxaban.  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 Isidore Moos and Dr. Chryl Heck. Last radiation 03/05/2020 and going down hill since.  Per oncology Dr. Chryl Heck- patient refused chemo and radiation, and refused further work-up of pulmonary nodules with bronchoscopy.  Palliative care was consulted here  in the hospital.  Would recommend that they follow at SNF as well.    Thrombocytopenia Possibly due to consumption.  This has resolved.  Anemia of chronic disease Hemoglobin low but stable.  No overt bleeding noted.    New onset prediabetes w/ hyperglycemia HbA1c 6.3.  Elevated glucose levels could be due to steroids.    Monitor CBGs once daily.  Abnormal LFTs Abdominal ultrasound did not show any acute findings.  LFTs stable.  Check periodically.  Positive hepatitis C antibody, likely false positive HCV quantitative test did not detect any virus.  Goals of care Patient noted to be DNR/DNI after pulmonology discussed patient's condition with patient's family.  Overall stable.  Okay for discharge to SNF.  PERTINENT LABS:  The results of significant diagnostics from this hospitalization (including imaging, microbiology, ancillary and laboratory) are listed below for reference.    Microbiology: Recent Results (from the past 240 hour(s))  SARS Coronavirus 2 by RT PCR (hospital order, performed in [facility] hospital lab) Nasopharyngeal Nasopharyngeal Swab     Status: None   Collection Time: 03/25/20  2:17 PM   Specimen: Nasopharyngeal Swab  Result Value Ref Range Status   SARS Coronavirus 2 NEGATIVE NEGATIVE Final    Comment: (NOTE) SARS-CoV-2 target nucleic acids are NOT DETECTED.  The SARS-CoV-2 RNA is generally detectable in upper and lower respiratory specimens during the acute phase of infection. The lowest concentration of SARS-CoV-2 viral copies this assay can detect is 250 copies / mL. A negative result does not preclude SARS-CoV-2 infection and should not be used as the sole basis for treatment or other patient management decisions.  A negative result may occur with improper specimen collection / handling, submission of specimen other than nasopharyngeal swab, presence of viral mutation(s) within the areas targeted by this assay, and inadequate number of viral copies (<250 copies / mL). A negative result must be combined with  clinical observations, patient history, and epidemiological information.  Fact Sheet for Patients:   StrictlyIdeas.no  Fact Sheet for Healthcare Providers: BankingDealers.co.za  This test is not yet approved or  cleared by the [place] FDA and has been authorized for detection and/or diagnosis of SARS-CoV-2 by FDA under an Emergency Use Authorization (EUA).  This EUA will remain in effect (meaning this test can be used) for the duration of the COVID-19 declaration under Section 564(b)(1) of the Act, 21 U.S.C. section 360bbb-3(b)(1), unless the authorization is terminated or revoked sooner.  Performed at [facility], [place] [address], [place], [place] [address]   Urine culture     Status: Abnormal   Collection Time: 03/25/20  4:40 PM   Specimen: In/Out Cath Urine  Result Value Ref Range Status   Specimen Description   Final    IN/OUT CATH URINE Performed at [place] [address] [place], [place] [address]    Special Requests   Final    NONE Performed at [facility], [place] [address] [place], [place] [address]    Culture MULTIPLE SPECIES PRESENT, SUGGEST RECOLLECTION (A)  Final   Report Status 03/27/2020 FINAL  Final  Blood culture (routine single)     Status: None   Collection Time: 03/26/20  7:42 AM   Specimen: BLOOD  Result Value Ref Range Status   Specimen Description   Final    BLOOD BLOOD LEFT FOREARM Performed at [place] [address] [place], [place] [address]    Special Requests   Final  BOTTLES DRAWN AEROBIC ONLY Blood Culture results may not be optimal due to an inadequate volume of blood received in culture bottles Performed at [facility], [place] [address] [place], [place] [address]    Culture   Final    NO GROWTH 5 DAYS Performed at [facility], [place] [address] [place], [place] [address]     Report Status 03/31/2020 FINAL  Final     Labs:  [id number] Labs  No results for input(s): DDIMER, FERRITIN, LDH, CRP in the last 72 hours.  Lab Results  Component Value Date   [place] NEGATIVE 03/25/2020      Basic Metabolic Panel: Recent Labs  Lab 03/28/20 0525 03/29/20 0413 03/30/20 0439 03/31/20 0447 04/01/20 0449  NA 140 138 139 138 137  K 3.4* 3.3* 4.4 3.4* 3.3*  CL 111 107 107 105 101  CO2 19* 20* 23 22 22   GLUCOSE 215* 103* 92 107* 111*  BUN 18 17 17 16 12   CREATININE 1.14* 0.88 0.96 0.91 0.79  CALCIUM 7.9* 8.2* 8.7* 8.7* 8.6*  MG  --  1.4* 2.2  --   --    Liver Function Tests: Recent Labs  Lab 03/25/20 1400 03/26/20 0424 03/27/20 0520 03/28/20 0525 03/29/20 0413  AST 74* 63* 69* 52* 84*  ALT 53* 50* 52* 45* 65*  ALKPHOS 74 66 73 63 61  BILITOT 0.6 0.6 0.3 0.2* 0.5  PROT 7.1 6.0* 5.8* 5.3* 5.3*  ALBUMIN 3.1* 2.7* 2.5* 2.2* 2.1*   CBC: Recent Labs  Lab 03/25/20 1248 03/26/20 0424 03/27/20 0520 03/28/20 0525 03/29/20 0413 03/30/20 0439 03/31/20 0447  WBC 11.9*   < > 9.5 8.0 8.6 10.4 9.2  NEUTROABS 10.4*  --   --   --   --   --   --   HGB 12.0   < > 9.2* 8.8* 8.3* 9.9* 10.1*  HCT 38.6   < > 29.3* 28.8* 26.8* 31.8* 31.4*  MCV 78.8*   < > 78.1* 79.6* 78.8* 78.9* 77.0*  PLT 117*   < > 145* 160 157 167 162   < > = values in this interval not displayed.   Cardiac Enzymes: Recent Labs  Lab 03/25/20 1400  CKTOTAL 40    CBG: Recent Labs  Lab 03/27/20 1542  GLUCAP 123*     IMAGING STUDIES DG Lumbar Spine Complete  Result Date: 03/25/2020 CLINICAL DATA:  Fall. EXAM: LUMBAR SPINE - COMPLETE 4+ VIEW COMPARISON:  None. FINDINGS: Normal alignment. Negative for fracture. Mild disc degeneration. Facet degeneration L4-5 and L5-S1 Atherosclerotic calcification aorta and iliac arteries without aneurysm. IMPRESSION: Negative for fracture Electronically Signed   By: Franchot Gallo M.D.   On: 03/25/2020 15:27   US Abdomen Complete  Result  Date: 03/25/2020 CLINICAL DATA:  Acute kidney injury and elevated LFTs. EXAM: ABDOMEN ULTRASOUND COMPLETE COMPARISON:  PET CT 02/04/2020 FINDINGS: Gallbladder: Physiologically distended. Layering sludge. No gallstones or wall thickening visualized. No sonographic Murphy sign noted by sonographer. Common bile duct: Diameter: 3 mm, normal. Liver: No focal lesion identified. Within normal limits in parenchymal echogenicity. Portal vein is patent on color Doppler imaging with normal direction of blood flow towards the liver. IVC: No abnormality visualized. Pancreas: Visualized portion unremarkable. Spleen: Size and appearance within normal limits. Right Kidney: Length: 10.0 cm. Echogenicity within normal limits. No hydronephrosis. 1.5 cm cyst in the lower kidney. No solid mass or visualized renal calculi. Left Kidney: Length: 8.3 cm. Slight increased renal parenchymal echogenicity. No hydronephrosis. 1.3  cm cyst in the medial kidney. No solid lesion or visualized renal calculi. Abdominal aorta: No aneurysm visualized.  Atheromatous plaque. Other findings: No abdominal ascites. There is a left pleural effusion. IMPRESSION: 1. Gallbladder sludge. No gallstones or findings of acute cholecystitis. 2. No explanation for elevated LFTs.  No biliary dilatation. 3. No hydronephrosis or obstructive uropathy. Mild left renal atrophy and increased echogenicity. Incidental small bilateral renal cysts. 4. Left pleural effusion. Electronically Signed   By: Keith Rake M.D.   On: 03/25/2020 20:02   DG Chest Port 1 View  Result Date: 03/27/2020 CLINICAL DATA:  Chest pain and shortness of breath, diminished breath sounds by report. EXAM: PORTABLE CHEST 1 VIEW COMPARISON:  Multiple prior studies dating back to 2010, most recent March 25, 2020 FINDINGS: Accounting for differences in technique no interval change in the appearance of interstitial and airspace disease in the LEFT upper chest compared to the recent comparison study.  Signs of emphysema in the background as before. Pleural and parenchymal scarring at the RIGHT lung base unchanged. Cardiomediastinal contours and hilar structures are stable with partially obscured LEFT hilar structures due to interstitial and airspace opacities. On limited assessment no acute skeletal process. IMPRESSION: No interval change in the appearance of the chest compared to the recent comparison study. Signs of emphysema with basilar scarring RIGHT greater than LEFT. Findings may represent infection as discussed previously. Note that this patient also has a history of neoplasm. Lymphangitic carcinomatosis is another differential consideration. Would also correlate with whether there has been radiation therapy to this area that could also be a cause of pneumonitis. CT of the chest may be helpful for further evaluation. Electronically Signed   By: Zetta Bills M.D.   On: 03/27/2020 15:24   DG Chest Port 1 View  Result Date: 03/25/2020 CLINICAL DATA:  Fall EXAM: PORTABLE CHEST 1 VIEW COMPARISON:  06/10/2008 FINDINGS: Extensive airspace disease left upper lobe consistent with pneumonia. Underlying emphysema. Blunting right costophrenic angle unchanged compatible with effusion. Right lung otherwise clear Heart size and vascularity normal. Atherosclerotic calcification aortic arch. IMPRESSION: Extensive infiltrate left upper lobe consistent with pneumonia. Possible TB. Small right pleural effusion. Electronically Signed   By: Franchot Gallo M.D.   On: 03/25/2020 15:29   DG Hip Unilat With Pelvis 2-3 Views Left  Result Date: 03/25/2020 CLINICAL DATA:  Fall.  History of lung cancer EXAM: DG HIP (WITH OR WITHOUT PELVIS) 2-3V LEFT COMPARISON:  None. FINDINGS: There is no evidence of hip fracture or dislocation. There is no evidence of arthropathy or other focal bone abnormality. IMPRESSION: Negative. Electronically Signed   By: Franchot Gallo M.D.   On: 03/25/2020 15:26   DG Hip Unilat With Pelvis 2-3  Views Right  Result Date: 03/25/2020 CLINICAL DATA:  Fall.  Hip pain.  Lung cancer history EXAM: DG HIP (WITH OR WITHOUT PELVIS) 2-3V RIGHT COMPARISON:  None. FINDINGS: There is no evidence of hip fracture or dislocation. There is no evidence of arthropathy or other focal bone abnormality. IMPRESSION: Negative. Electronically Signed   By: Franchot Gallo M.D.   On: 03/25/2020 15:26   VAS [place] LOWER EXTREMITY VENOUS (DVT)  Result Date: 03/26/2020  Lower Venous DVT Study Indications: Pain.  Risk Factors: None identified. Comparison Study: No prior studies. Performing Technologist: Oliver Hum RVT  Examination Guidelines: A complete evaluation includes B-mode imaging, spectral Doppler, color Doppler, and power Doppler as needed of all accessible portions of each vessel. Bilateral testing is considered an integral part of a  complete examination. Limited examinations for reoccurring indications may be performed as noted. The reflux portion of the exam is performed with the patient in reverse Trendelenburg.  +---------+---------------+---------+-----------+----------+--------------+ RIGHT    CompressibilityPhasicitySpontaneityPropertiesThrombus Aging +---------+---------------+---------+-----------+----------+--------------+ CFV      None           No       No                   Acute          +---------+---------------+---------+-----------+----------+--------------+ SFJ      Full                                                        +---------+---------------+---------+-----------+----------+--------------+ FV Prox  None           No       No                   Acute          +---------+---------------+---------+-----------+----------+--------------+ FV Mid   None           No       No                   Acute          +---------+---------------+---------+-----------+----------+--------------+ FV DistalNone           No       No                   Acute           +---------+---------------+---------+-----------+----------+--------------+ PFV      None           No       No                   Acute          +---------+---------------+---------+-----------+----------+--------------+ POP      None           No       No                   Acute          +---------+---------------+---------+-----------+----------+--------------+ PTV      None                                         Acute          +---------+---------------+---------+-----------+----------+--------------+ PERO     Partial                                      Acute          +---------+---------------+---------+-----------+----------+--------------+ Gastroc  Full                                                        +---------+---------------+---------+-----------+----------+--------------+ EIV  Yes      Yes                                 +---------+---------------+---------+-----------+----------+--------------+ CIV                     Yes      Yes                                 +---------+---------------+---------+-----------+----------+--------------+   +---------+---------------+---------+-----------+----------+--------------+ LEFT     CompressibilityPhasicitySpontaneityPropertiesThrombus Aging +---------+---------------+---------+-----------+----------+--------------+ CFV      Full           Yes      Yes                                 +---------+---------------+---------+-----------+----------+--------------+ SFJ      Full                                                        +---------+---------------+---------+-----------+----------+--------------+ FV Prox  Full                                                        +---------+---------------+---------+-----------+----------+--------------+ FV Mid   Full                                                         +---------+---------------+---------+-----------+----------+--------------+ FV DistalFull                                                        +---------+---------------+---------+-----------+----------+--------------+ PFV      Full                                                        +---------+---------------+---------+-----------+----------+--------------+ POP      None           No       No                   Acute          +---------+---------------+---------+-----------+----------+--------------+ PTV      None                                         Acute          +---------+---------------+---------+-----------+----------+--------------+ PERO     Partial  Acute          +---------+---------------+---------+-----------+----------+--------------+     Summary: RIGHT: - Findings consistent with acute deep vein thrombosis involving the right common femoral vein, right femoral vein, right proximal profunda vein, right popliteal vein, right posterior tibial veins, and right peroneal veins. - No cystic structure found in the popliteal fossa.  LEFT: - Findings consistent with acute deep vein thrombosis involving the left popliteal vein, left posterior tibial veins, and left peroneal veins. - No cystic structure found in the popliteal fossa.  *See table(s) above for measurements and observations. Electronically signed by Monica Martinez MD on 03/26/2020 at 4:07:53 PM.    Final     DISCHARGE EXAMINATION: Vitals:   03/31/20 1800 03/31/20 2052 04/01/20 0450 04/01/20 0600  BP:  129/87 (!) 155/95   Pulse:  98 (!) 121   Resp: 18  20 17   Temp:  99.2 F (37.3 C) 99.5 F (37.5 C)   TempSrc:  Oral Oral   SpO2:  98% 96%   Weight:      Height:       General appearance: Awake alert.  In no distress.  Mildly distracted Resp: Normal effort.  Few crackles on the right.  No wheezing or rhonchi. Cardio: S1-S2 is normal regular.  No S3-S4.  No  rubs murmurs or bruit GI: Abdomen is soft.  Nontender nondistended.  Bowel sounds are present normal.  No masses organomegaly    DISPOSITION: SNF  Discharge Instructions    Call MD for:  difficulty breathing, headache or visual disturbances   Complete by: As directed    Call MD for:  extreme fatigue   Complete by: As directed    Call MD for:  persistant dizziness or light-headedness   Complete by: As directed    Call MD for:  persistant nausea and vomiting   Complete by: As directed    Call MD for:  severe uncontrolled pain   Complete by: As directed    Call MD for:  temperature >100.4   Complete by: As directed    Discharge instructions   Complete by: As directed    Please review instructions on the discharge summary.  You were cared for by a hospitalist during your hospital stay. If you have any questions about your discharge medications or the care you received while you were in the hospital after you are discharged, you can call the unit and asked to speak with the hospitalist on call if the hospitalist that took care of you is not available. Once you are discharged, your primary care physician will handle any further medical issues. Please note that NO REFILLS for any discharge medications will be authorized once you are discharged, as it is imperative that you return to your primary care physician (or establish a relationship with a primary care physician if you do not have one) for your aftercare needs so that they can reassess your need for medications and monitor your lab values. If you do not have a primary care physician, you can call [phone] for a physician referral.   Increase activity slowly   Complete by: As directed         Allergies as of 04/01/2020   No Known Allergies     Medication List    STOP taking these medications   amLODipine-olmesartan 10-40 MG tablet Commonly known as: AZOR   spironolactone 25 MG tablet Commonly known as: ALDACTONE     TAKE  these medications   (feeding supplement) PROSource  Plus liquid Take 30 mLs by mouth 2 (two) times daily between meals.   feeding supplement Liqd Take 237 mLs by mouth 2 (two) times daily between meals.   amoxicillin-clavulanate 875-125 MG tablet Commonly known as: AUGMENTIN Take 1 tablet by mouth every 12 (twelve) hours for 2 days.   dexamethasone 2 MG tablet Commonly known as: DECADRON Take 4mg  daily for 1 week and then 2mg  daily for 1 week and then STOP.   dexlansoprazole 60 MG capsule Commonly known as: DEXILANT Take 60 mg by mouth daily.   HYDROcodone-acetaminophen 5-325 MG tablet Commonly known as: NORCO/VICODIN Take 1 tablet by mouth every 6 (six) hours as needed for severe pain.   montelukast 10 MG tablet Commonly known as: SINGULAIR Take 10 mg by mouth at bedtime.   multivitamin with minerals Tabs tablet Take 1 tablet by mouth daily.   nebivolol 10 MG tablet Commonly known as: BYSTOLIC Take 10 mg by mouth daily.   potassium chloride 20 MEQ packet Commonly known as: KLOR-CON Take 20 mEq by mouth daily.   primidone 50 MG tablet Commonly known as: MYSOLINE Take 50 mg by mouth 3 (three) times daily.   Rivaroxaban 15 MG Tabs tablet Commonly known as: XARELTO Take 1 tablet (15 mg total) by mouth 2 (two) times daily with a meal for 21 days.   rivaroxaban 20 MG Tabs tablet Commonly known as: XARELTO Take 1 tablet (20 mg total) by mouth daily with supper. Start after completing the twice daily dosing for 3 weeks Start taking on: April 18, 2020   rosuvastatin 20 MG tablet Commonly known as: CRESTOR Take 20 mg by mouth daily.         Contact information for follow-up providers    Lucianne Lei, MD. Schedule an appointment as soon as possible for a visit in 1 week(s).   Specialty: Family Medicine Contact information: [address] [place] [address] [phone]            Contact information for after-discharge care    Destination     [facility] Preferred SNF .   Service: Skilled Nursing Contact information: [address] [place] [phone]                  TOTAL DISCHARGE TIME: 53 minutes  [place]  [facility] Pager on [email or web address]  04/01/2020, 11:18 AM

## 2020-04-01 NOTE — Progress Notes (Signed)
Occupational Therapy Treatment Patient Details Name: Amanda Rivers MRN: [id number] DOB: 16-Apr-1945 Today's Date: 04/01/2020    History of present illness 75 year old female with HTN, HLD, oral cancer on radiation (last dose ~ 3 wks ago).  Brought in due to failure to thrive.  Patient was found to have pneumonia and acute DVT   OT comments  Treatment focused on promoting functional mobility with intention to perform ADL task. However, after mod assist to transfer into sitting, min assist to stand and therapist needing to assist with placing hands on walker and walking 4 feet forward patient reports fatigue and wanting to sit down. Patient able to walk another 4 feet to get to recliner and then return to seated position without control. Patient reporting shoulder pain today and not using with mobility. Patient somewhat resistant when therapist attempted to provide PROM. Patient's o2 sat maintained around 91% on RA and HR up to 129 with minimal activity. Left patient on 0.5 L [place] and RN notified.   Follow Up Recommendations  SNF    Equipment Recommendations  None recommended by OT    Recommendations for Other Services      Precautions / Restrictions Precautions Precautions: Fall Precaution Comments: monitor o2 and HR Restrictions Weight Bearing Restrictions: No       Mobility Bed Mobility Overal bed mobility: Needs Assistance Bed Mobility: Supine to Sit     Supine to sit: Mod assist;HOB elevated     General bed mobility comments: Mod assist to transfer to side of bed - to guide LEs and assist with trunk lift up. Patient reports being cold and not using UEs to assist.  Transfers Overall transfer level: Needs assistance Equipment used: Rolling walker (2 wheeled) Transfers: Sit to/from Stand Sit to Stand: Min assist;From elevated surface         General transfer comment: MIn assist to stand from elevated bed and min guard to take steps 4 feet forward. The intention was to  walk to the bathroom but patient wanted to turn around and only go the chair. Approx 8 feet total. Return to sitting in recliner uncontrolled. Once again, patient not using upper extremities. Reports pain in bilateral shoulders.    Balance Overall balance assessment: Mild deficits observed, not formally tested                                         ADL either performed or assessed with clinical judgement   ADL                                               Vision Patient Visual Report: No change from baseline     Perception     Praxis      Cognition Arousal/Alertness: Awake/alert Behavior During Therapy: WFL for tasks assessed/performed Overall Cognitive Status: Within Functional Limits for tasks assessed                                          Exercises     Shoulder Instructions       General Comments      Pertinent Vitals/ Pain       Pain Assessment: Faces Faces  Pain Scale: Hurts little more Pain Location: Shoulders with AROM Pain Descriptors / Indicators: Aching;Grimacing Pain Intervention(s): Limited activity within patient's tolerance;Monitored during session  Home Living                                          Prior Functioning/Environment              Frequency  Min 2X/week        Progress Toward Goals  OT Goals(current goals can now be found in the care plan section)  Progress towards OT goals: Progressing toward goals  Acute Rehab OT Goals Patient Stated Goal: to get stronger OT Goal Formulation: With patient Time For Goal Achievement: 04/12/20 Potential to Achieve Goals: Good  Plan Discharge plan remains appropriate    Co-evaluation                 AM-PAC OT "6 Clicks" Daily Activity     Outcome Measure   Help from another person eating meals?: A Little Help from another person taking care of personal grooming?: A Little Help from another person  toileting, which includes using toliet, bedpan, or urinal?: A Lot   Help from another person to put on and taking off regular upper body clothing?: A Lot Help from another person to put on and taking off regular lower body clothing?: A Lot 6 Click Score: 12    End of Session Equipment Utilized During Treatment: Rolling walker  OT Visit Diagnosis: Unsteadiness on feet (R26.81);Muscle weakness (generalized) (M62.81);Other symptoms and signs involving cognitive function   Activity Tolerance Patient limited by fatigue   Patient Left in chair;with call bell/phone within reach;with chair alarm set   Nurse Communication Mobility status (HR)        Time: 8453-6468 OT Time Calculation (min): 23 min  Charges: OT General Charges $OT Visit: 1 Visit OT Treatments $Self Care/Home Management : 23-37 mins  Lynnell Fiumara, OTR/L [place]  Office [phone] Pager: [place] 04/01/2020, 11:50 AM

## 2020-04-01 NOTE — Plan of Care (Signed)
  Problem: Clinical Measurements: Goal: Ability to maintain clinical measurements within normal limits will improve Outcome: Progressing Goal: Will remain free from infection 04/01/2020 1448 by Zadie Rhine, RN Outcome: Progressing 04/01/2020 1347 by Zadie Rhine, RN Outcome: Progressing Goal: Diagnostic test results will improve Outcome: Progressing

## 2020-04-01 NOTE — Plan of Care (Signed)

## 2020-04-01 NOTE — Progress Notes (Signed)
Pt HR 99-100, request increase on O2; O2 currently at 2 liters with RR 18-20. Pt in chair, denies pain. Will continue to monitor. SRP, RN

## 2020-04-01 NOTE — Progress Notes (Signed)
Physical Therapy Treatment Patient Details Name: Amanda Rivers MRN: [id number] DOB: [birth year] Today's Date: 04/01/2020    History of Present Illness 75 year old female with HTN, HLD, oral cancer on radiation (last dose ~ 3 wks ago).  Brought in due to failure to thrive.  Patient was found to have pneumonia and acute DVT    PT Comments    General Comments: AxO x 3 pleasant but does not have full understanding of her medical situation General bed mobility comments: OOB in recliner via OT General transfer comment: required increased assist from lower level recliner and toilet.  Pt very tall and lean.  Also limited self ability to rise using B UE's esp L shoulder appeared limited ROM.  Also required assist to perform peri care as pt was unable due to balance instability. General Gait Details: assist for stability esp with turns. 25% VC's on proper upright posture.  Limited amb distance due to dyspnea.  Avg RA > 90% and HR 88.  Pt stated "I feel winded".  Pt will need ST Rehab at SNF  Follow Up Recommendations  SNF     Equipment Recommendations  None recommended by PT    Recommendations for Other Services       Precautions / Restrictions Precautions Precautions: Fall Precaution Comments: monitor o2 and HR    Mobility  Bed Mobility               General bed mobility comments: OOB in recliner via OT    Transfers Overall transfer level: Needs assistance Equipment used: Rolling walker (2 wheeled) Transfers: Sit to/from [facility] Sit to Stand: Mod assist Stand pivot transfers: Min assist;Mod assist       General transfer comment: required increased assist from lower level recliner and toilet.  Pt very tall and lean.  Also limited self ability to rise using B UE's esp L shoulder appeared limited ROM.  Also required assist to perform peri care as pt was unable due to balance instability.  Ambulation/Gait Ambulation/Gait assistance: Min assist;Min  guard Gait Distance (Feet): 22 Feet (11 feet x 2 to and from bathroom) Assistive device: Rolling walker (2 wheeled) Gait Pattern/deviations: Step-through pattern;Decreased stride length Gait velocity: decreased   General Gait Details: assist for stability esp with turns. 25% VC's on proper upright posture.  Limited amb distance due to dyspnea.  Avg RA > 90% and HR 88.  Pt stated "I feel winded".   Stairs             Wheelchair Mobility    Modified Rankin (Stroke Patients Only)       Balance                                            Cognition Arousal/Alertness: Awake/alert Behavior During Therapy: WFL for tasks assessed/performed Overall Cognitive Status: Within Functional Limits for tasks assessed                                 General Comments: AxO x 3 pleasant but does not have full understanding of her medical situation      Exercises      General Comments        Pertinent Vitals/Pain Pain Assessment: Faces Faces Pain Scale: Hurts a little bit Pain Location: "my butt" Pain Descriptors / Indicators: Aching;Grimacing Pain  Intervention(s): Monitored during session    Home Living                      Prior Function            PT Goals (current goals can now be found in the care plan section) Progress towards PT goals: Progressing toward goals    Frequency    Min 2X/week      PT Plan Current plan remains appropriate    Co-evaluation              AM-PAC PT "6 Clicks" Mobility   Outcome Measure  Help needed turning from your back to your side while in a flat bed without using bedrails?: A Little Help needed moving from lying on your back to sitting on the side of a flat bed without using bedrails?: A Little Help needed moving to and from a bed to a chair (including a wheelchair)?: A Little Help needed standing up from a chair using your arms (e.g., wheelchair or bedside chair)?: A Little Help  needed to walk in hospital room?: A Lot Help needed climbing 3-5 steps with a railing? : A Lot 6 Click Score: 16    End of Session Equipment Utilized During Treatment: Gait belt Activity Tolerance: Patient tolerated treatment well Patient left: in chair;with call bell/phone within reach;with chair alarm set Nurse Communication: Mobility status PT Visit Diagnosis: Other abnormalities of gait and mobility (R26.89)     Time: 1450-1515 PT Time Calculation (min) (ACUTE ONLY): 25 min  Charges:  $Gait Training: 8-22 mins $Therapeutic Activity: 8-22 mins                     Rica Koyanagi  PTA Acute  Rehabilitation Services Pager      [phone] Office      [phone]

## 2020-04-01 NOTE — TOC Progression Note (Signed)
Transition of Care [facility]) - Progression Note    Patient Details  Name: Amanda Rivers MRN: [id number] Date of Birth: 1946/02/13  [facility] CM/SW Contact  Purcell Mouton, RN Phone Number: 04/01/2020, 10:11 AM  Clinical Narrative:     [place] again explained that pt was discharged and pt's daughter lives in [place]. Daughter would like pt transported to [name]. Admission Coordinator asked that clinicals be re-faxed. Daughter was also called.  Expected Discharge Plan: (P) Skilled Nursing Facility Barriers to Discharge: (P) Continued Medical Work up  Expected Discharge Plan and Services Expected Discharge Plan: (P) [name]       Living arrangements for the past 2 months: Single Family Home Expected Discharge Date: 03/30/20                                     Social Determinants of Health (SDOH) Interventions    Readmission Risk Interventions No flowsheet data found.

## 2020-04-02 ENCOUNTER — Ambulatory Visit: Payer: Medicare HMO

## 2020-04-02 LAB — BASIC METABOLIC PANEL
Anion gap: 12 (ref 5–15)
BUN: 15 mg/dL (ref 8–23)
CO2: 25 mmol/L (ref 22–32)
Calcium: 8.7 mg/dL — ABNORMAL LOW (ref 8.9–10.3)
Chloride: 101 mmol/L (ref 98–111)
Creatinine, Ser: 0.86 mg/dL (ref 0.44–1.00)
GFR, Estimated: >60 mL/min (ref >60)
Glucose, Bld: 98 mg/dL (ref 70–99)
Potassium: 3.4 mmol/L — ABNORMAL LOW (ref 3.5–5.1)
Sodium: 138 mmol/L (ref 135–145)

## 2020-04-02 MED ORDER — POTASSIUM CHLORIDE 20 MEQ PO PACK: 40.0000 meq | PACK | Freq: Every day | ORAL | Status: DC

## 2020-04-02 MED ORDER — POTASSIUM CHLORIDE 20 MEQ PO PACK
40.0000 meq | PACK | Freq: Every day | ORAL | Status: DC
  Administered 2020-04-02 – 2020-04-08 (×7): 40 meq via ORAL
  Filled 2020-04-02 (×7): qty 2

## 2020-04-02 NOTE — Consult Note (Signed)
   [facility] Inpatient Consult   04/02/2020  Amanda Rivers Jul 02, 1945 [id number]   Patient screened for high risk score for unplanned readmission. Chart reviewed to assess for potential [place] Management community service needs. Per review, current disposition is for skilled nursing facility.  No [facility] CM ambulatory follow up needs.  Of note, [facility] Care Management services does not replace or interfere with any services that are arranged by inpatient case management or social work.  [name], MSN, [facility] Liaison Nurse Mobile Phone [phone]  Toll free office [phone]

## 2020-04-02 NOTE — Discharge Summary (Addendum)
[facility]  Physician Discharge Summary   Patient ID: Amanda Rivers MRN: [id number] DOB/AGE: [birth year] 75 y.o.  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 Palliative care to follow at SNF 2. Check CBGs once daily 3. 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  Brought in due to failure to thrive.  Patient was found to have pneumonia.  Also found to have acute DVT.  Hospitalized for further management.     Consultations:  Pulmonology  Palliative care    HOSPITAL COURSE:   Left upper lobe pneumonia/sepsis POA/possible radiation pneumonitis Patient presented with tachycardia tachypnea leukocytosis, extensive infiltrate left upper lobe. Tuberculosis was a consideration.  Patient seen by pulmonology who did not think that this was tuberculosis.   Isolation precautions were discontinued. Patient was placed on ceftriaxone and azithromycin.    She has completed course of antibiotics. Patient started on steroids by pulmonology due to concern for radiation pneumonitis.  Continue dexamethasone with taper over 2 weeks.  AKI/normal anion gap metabolic acidosis/hypokalemia and  hypomagnesemia Baseline creatinine 1-1.2.  Presented with creatinine of 2.2.   Improved with IV hydration.    Potassium being supplemented daily.  Check labs in 1 week.  Sinus tachycardia Likely due to anxiety as well as rebound tachycardia from being off of Bystolic.  Heart rate now improved.  Continue Bystolic at current dose.     Failure to thrive x3 weeks Patient has had poor oral intake.  Apparently has been going downhill since she was started on radiation treatment about a month ago.  Abdominal ultrasound was done which showed gallbladder sludge without any gallstones or hydronephrosis.   Oral intake has improved some.  Unlikely she will fully get back to normal due to anorexia from her cancer.  Continue to encourage oral intake.     Oropharyngeal dysphagia Aspiration precautions.    Seen by speech therapy.  On dysphagia 2 diet with thin liquids.  Acute metabolic encephalopathy Likely due to metabolic derangements and dehydration.  Seems to be improved.    Acute bilateral lower extremity DVT Patient had bilateral leg pain and D-dimer more than 20 which prompted duplex of the leg which revealed bilateral DVT. Discussed w/ her oncologist Dr Chryl Heck, and also with patient's daughter. Initially treated with IV heparin.  Transitioned to rivaroxaban.  Will likely need lifelong anticoagulation due to cancer.  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 Isidore Moos and Dr. Chryl Heck. Last radiation 03/05/2020 and going down hill since.  Per oncology Dr. Chryl Heck- patient refused chemo and radiation, and refused further work-up of pulmonary nodules with bronchoscopy.  Palliative care was consulted here in the hospital.  Would recommend that they follow at SNF as well.   Thrombocytopenia Possibly due to consumption.  This has resolved.  Anemia of chronic disease Hemoglobin  low but stable.  No overt bleeding noted.    New onset prediabetes w/  hyperglycemia HbA1c 6.3.  Elevated glucose levels could be due to steroids.    Monitor CBGs once daily.  Abnormal LFTs Abdominal ultrasound did not show any acute findings.  LFTs stable.  Check periodically.  Positive hepatitis C antibody, likely false positive HCV quantitative test did not detect any virus.  Goals of care Patient noted to be DNR/DNI after pulmonology discussed patient's condition with patient's family.  Patient remained stable.  Okay for discharge to SNF when bed is available.  PERTINENT LABS:  The results of significant diagnostics from this hospitalization (including imaging, microbiology, ancillary and laboratory) are listed below for reference.    Microbiology: Recent Results (from the past 240 hour(s))  SARS Coronavirus 2 by RT PCR (hospital order, performed in [facility] hospital lab) Nasopharyngeal Nasopharyngeal Swab     Status: None   Collection Time: 03/25/20  2:17 PM   Specimen: Nasopharyngeal Swab  Result Value Ref Range Status   SARS Coronavirus 2 NEGATIVE NEGATIVE Final    Comment: (NOTE) SARS-CoV-2 target nucleic acids are NOT DETECTED.  The SARS-CoV-2 RNA is generally detectable in upper and lower respiratory specimens during the acute phase of infection. The lowest concentration of SARS-CoV-2 viral copies this assay can detect is 250 copies / mL. A negative result does not preclude SARS-CoV-2 infection and should not be used as the sole basis for treatment or other patient management decisions.  A negative result may occur with improper specimen collection / handling, submission of specimen other than nasopharyngeal swab, presence of viral mutation(s) within the areas targeted by this assay, and inadequate number of viral copies (<250 copies / mL). A negative result must be combined with clinical observations, patient history, and epidemiological information.  Fact Sheet for Patients:   StrictlyIdeas.no  Fact  Sheet for Healthcare Providers: BankingDealers.co.za  This test is not yet approved or  cleared by the [place] FDA and has been authorized for detection and/or diagnosis of SARS-CoV-2 by FDA under an Emergency Use Authorization (EUA).  This EUA will remain in effect (meaning this test can be used) for the duration of the COVID-19 declaration under Section 564(b)(1) of the Act, 21 U.S.C. section 360bbb-3(b)(1), unless the authorization is terminated or revoked sooner.  Performed at [facility], [place] [address] [place], [place] [address]   Urine culture     Status: Abnormal   Collection Time: 03/25/20  4:40 PM   Specimen: In/Out Cath Urine  Result Value Ref Range Status   Specimen Description   Final    IN/OUT CATH URINE Performed at [place] [address], [place], [place] [address]    Special Requests   Final    NONE Performed at [facility], [place] [address] [place], [place] [address]    Culture MULTIPLE SPECIES PRESENT, SUGGEST RECOLLECTION (A)  Final   Report Status 03/27/2020 FINAL  Final  Blood culture (routine single)     Status: None   Collection Time: 03/26/20  7:42 AM   Specimen: BLOOD  Result Value Ref Range Status   Specimen Description   Final    BLOOD BLOOD LEFT FOREARM Performed at [place] [address], [place], [place] [address]    Special Requests   Final    BOTTLES DRAWN AEROBIC ONLY Blood Culture results may not be optimal due to an inadequate volume of blood received in culture bottles  Performed at [facility], [place] [address], [place], [place] [address]    Culture   Final    NO GROWTH 5 DAYS Performed at [facility], [place] [address] [place],  [address]    Report Status 03/31/2020 FINAL  Final     Labs:  [id number] Labs   Lab Results  Component Value Date   [place] NEGATIVE 03/25/2020       Basic Metabolic Panel: Recent Labs  Lab 03/29/20 0413 03/30/20 0439 03/31/20 0447 04/01/20 0449 04/02/20 0454  NA 138 139 138 137 138  K 3.3* 4.4 3.4* 3.3* 3.4*  CL 107 107 105 101 101  CO2 20* 23 22 22 25   GLUCOSE 103* 92 107* 111* 98  BUN 17 17 16 12 15   CREATININE 0.88 0.96 0.91 0.79 0.86  CALCIUM 8.2* 8.7* 8.7* 8.6* 8.7*  MG 1.4* 2.2  --   --   --    Liver Function Tests: Recent Labs  Lab 03/27/20 0520 03/28/20 0525 03/29/20 0413  AST 69* 52* 84*  ALT 52* 45* 65*  ALKPHOS 73 63 61  BILITOT 0.3 0.2* 0.5  PROT 5.8* 5.3* 5.3*  ALBUMIN 2.5* 2.2* 2.1*   CBC: Recent Labs  Lab 03/27/20 0520 03/28/20 0525 03/29/20 0413 03/30/20 0439 03/31/20 0447  WBC 9.5 8.0 8.6 10.4 9.2  HGB 9.2* 8.8* 8.3* 9.9* 10.1*  HCT 29.3* 28.8* 26.8* 31.8* 31.4*  MCV 78.1* 79.6* 78.8* 78.9* 77.0*  PLT 145* 160 157 167 162    CBG: Recent Labs  Lab 03/27/20 1542  GLUCAP 123*     IMAGING STUDIES DG Lumbar Spine Complete  Result Date: 03/25/2020 CLINICAL DATA:  Fall. EXAM: LUMBAR SPINE - COMPLETE 4+ VIEW COMPARISON:  None. FINDINGS: Normal alignment. Negative for fracture. Mild disc degeneration. Facet degeneration L4-5 and L5-S1 Atherosclerotic calcification aorta and iliac arteries without aneurysm. IMPRESSION: Negative for fracture Electronically Signed   By: Franchot Gallo M.D.   On: 03/25/2020 15:27   US Abdomen Complete  Result Date: 03/25/2020 CLINICAL DATA:  Acute kidney injury and elevated LFTs. EXAM: ABDOMEN ULTRASOUND COMPLETE COMPARISON:  PET CT 02/04/2020 FINDINGS: Gallbladder: Physiologically distended. Layering sludge. No gallstones or wall thickening visualized. No sonographic Murphy sign noted by sonographer. Common bile duct: Diameter: 3 mm, normal. Liver: No focal lesion identified. Within normal limits in parenchymal echogenicity. Portal vein is patent on color Doppler imaging with normal direction of blood flow towards the liver. IVC: No abnormality visualized.  Pancreas: Visualized portion unremarkable. Spleen: Size and appearance within normal limits. Right Kidney: Length: 10.0 cm. Echogenicity within normal limits. No hydronephrosis. 1.5 cm cyst in the lower kidney. No solid mass or visualized renal calculi. Left Kidney: Length: 8.3 cm. Slight increased renal parenchymal echogenicity. No hydronephrosis. 1.3 cm cyst in the medial kidney. No solid lesion or visualized renal calculi. Abdominal aorta: No aneurysm visualized.  Atheromatous plaque. Other findings: No abdominal ascites. There is a left pleural effusion. IMPRESSION: 1. Gallbladder sludge. No gallstones or findings of acute cholecystitis. 2. No explanation for elevated LFTs.  No biliary dilatation. 3. No hydronephrosis or obstructive uropathy. Mild left renal atrophy and increased echogenicity. Incidental small bilateral renal cysts. 4. Left pleural effusion. Electronically Signed   By: Keith Rake M.D.   On: 03/25/2020 20:02   DG Chest Port 1 View  Result Date: 03/27/2020 CLINICAL DATA:  Chest pain and shortness of breath, diminished breath sounds by report. EXAM: PORTABLE CHEST 1 VIEW COMPARISON:  Multiple prior studies dating  back to 2010, most recent March 25, 2020 FINDINGS: Accounting for differences in technique no interval change in the appearance of interstitial and airspace disease in the LEFT upper chest compared to the recent comparison study. Signs of emphysema in the background as before. Pleural and parenchymal scarring at the RIGHT lung base unchanged. Cardiomediastinal contours and hilar structures are stable with partially obscured LEFT hilar structures due to interstitial and airspace opacities. On limited assessment no acute skeletal process. IMPRESSION: No interval change in the appearance of the chest compared to the recent comparison study. Signs of emphysema with basilar scarring RIGHT greater than LEFT. Findings may represent infection as discussed previously. Note that this  patient also has a history of neoplasm. Lymphangitic carcinomatosis is another differential consideration. Would also correlate with whether there has been radiation therapy to this area that could also be a cause of pneumonitis. CT of the chest may be helpful for further evaluation. Electronically Signed   By: Zetta Bills M.D.   On: 03/27/2020 15:24   DG Chest Port 1 View  Result Date: 03/25/2020 CLINICAL DATA:  Fall EXAM: PORTABLE CHEST 1 VIEW COMPARISON:  06/10/2008 FINDINGS: Extensive airspace disease left upper lobe consistent with pneumonia. Underlying emphysema. Blunting right costophrenic angle unchanged compatible with effusion. Right lung otherwise clear Heart size and vascularity normal. Atherosclerotic calcification aortic arch. IMPRESSION: Extensive infiltrate left upper lobe consistent with pneumonia. Possible TB. Small right pleural effusion. Electronically Signed   By: Franchot Gallo M.D.   On: 03/25/2020 15:29   DG Hip Unilat With Pelvis 2-3 Views Left  Result Date: 03/25/2020 CLINICAL DATA:  Fall.  History of lung cancer EXAM: DG HIP (WITH OR WITHOUT PELVIS) 2-3V LEFT COMPARISON:  None. FINDINGS: There is no evidence of hip fracture or dislocation. There is no evidence of arthropathy or other focal bone abnormality. IMPRESSION: Negative. Electronically Signed   By: Franchot Gallo M.D.   On: 03/25/2020 15:26   DG Hip Unilat With Pelvis 2-3 Views Right  Result Date: 03/25/2020 CLINICAL DATA:  Fall.  Hip pain.  Lung cancer history EXAM: DG HIP (WITH OR WITHOUT PELVIS) 2-3V RIGHT COMPARISON:  None. FINDINGS: There is no evidence of hip fracture or dislocation. There is no evidence of arthropathy or other focal bone abnormality. IMPRESSION: Negative. Electronically Signed   By: Franchot Gallo M.D.   On: 03/25/2020 15:26   VAS [place] LOWER EXTREMITY VENOUS (DVT)  Result Date: 03/26/2020  Lower Venous DVT Study Indications: Pain.  Risk Factors: None identified. Comparison Study: No prior  studies. Performing Technologist: Oliver Hum RVT  Examination Guidelines: A complete evaluation includes B-mode imaging, spectral Doppler, color Doppler, and power Doppler as needed of all accessible portions of each vessel. Bilateral testing is considered an integral part of a complete examination. Limited examinations for reoccurring indications may be performed as noted. The reflux portion of the exam is performed with the patient in reverse Trendelenburg.  +---------+---------------+---------+-----------+----------+--------------+ RIGHT    CompressibilityPhasicitySpontaneityPropertiesThrombus Aging +---------+---------------+---------+-----------+----------+--------------+ CFV      None           No       No                   Acute          +---------+---------------+---------+-----------+----------+--------------+ SFJ      Full                                                        +---------+---------------+---------+-----------+----------+--------------+  FV Prox  None           No       No                   Acute          +---------+---------------+---------+-----------+----------+--------------+ FV Mid   None           No       No                   Acute          +---------+---------------+---------+-----------+----------+--------------+ FV DistalNone           No       No                   Acute          +---------+---------------+---------+-----------+----------+--------------+ PFV      None           No       No                   Acute          +---------+---------------+---------+-----------+----------+--------------+ POP      None           No       No                   Acute          +---------+---------------+---------+-----------+----------+--------------+ PTV      None                                         Acute          +---------+---------------+---------+-----------+----------+--------------+ PERO     Partial                                       Acute          +---------+---------------+---------+-----------+----------+--------------+ Gastroc  Full                                                        +---------+---------------+---------+-----------+----------+--------------+ EIV                     Yes      Yes                                 +---------+---------------+---------+-----------+----------+--------------+ CIV                     Yes      Yes                                 +---------+---------------+---------+-----------+----------+--------------+   +---------+---------------+---------+-----------+----------+--------------+ LEFT     CompressibilityPhasicitySpontaneityPropertiesThrombus Aging +---------+---------------+---------+-----------+----------+--------------+ CFV      Full           Yes      Yes                                 +---------+---------------+---------+-----------+----------+--------------+  SFJ      Full                                                        +---------+---------------+---------+-----------+----------+--------------+ FV Prox  Full                                                        +---------+---------------+---------+-----------+----------+--------------+ FV Mid   Full                                                        +---------+---------------+---------+-----------+----------+--------------+ FV DistalFull                                                        +---------+---------------+---------+-----------+----------+--------------+ PFV      Full                                                        +---------+---------------+---------+-----------+----------+--------------+ POP      None           No       No                   Acute          +---------+---------------+---------+-----------+----------+--------------+ PTV      None                                         Acute           +---------+---------------+---------+-----------+----------+--------------+ PERO     Partial                                      Acute          +---------+---------------+---------+-----------+----------+--------------+     Summary: RIGHT: - Findings consistent with acute deep vein thrombosis involving the right common femoral vein, right femoral vein, right proximal profunda vein, right popliteal vein, right posterior tibial veins, and right peroneal veins. - No cystic structure found in the popliteal fossa.  LEFT: - Findings consistent with acute deep vein thrombosis involving the left popliteal vein, left posterior tibial veins, and left peroneal veins. - No cystic structure found in the popliteal fossa.  *See table(s) above for measurements and observations. Electronically signed by Monica Martinez MD on 03/26/2020 at 4:07:53 PM.    Final     DISCHARGE EXAMINATION: Vitals:   04/01/20 1300 04/01/20 1317 04/01/20 1900 04/02/20 1007  BP:  121/75 131/89 121/83  Pulse:  Marland Kitchen)  105 97 (!) 115  Resp: 19 20 18    Temp:  97.7 F (36.5 C) 97.6 F (36.4 C)   TempSrc:  Oral Oral   SpO2:  98% 95%   Weight:      Height:       General appearance: Awake alert.  In no distress.  Distracted Resp: Clear to auscultation bilaterally.  Normal effort Cardio: S1-S2 is normal regular.  No S3-S4.  No rubs murmurs or bruit GI: Abdomen is soft.  Nontender nondistended.  Bowel sounds are present normal.  No masses organomegaly      DISPOSITION: SNF  Discharge Instructions    Call MD for:  difficulty breathing, headache or visual disturbances   Complete by: As directed    Call MD for:  extreme fatigue   Complete by: As directed    Call MD for:  persistant dizziness or light-headedness   Complete by: As directed    Call MD for:  persistant nausea and vomiting   Complete by: As directed    Call MD for:  severe uncontrolled pain   Complete by: As directed    Call MD for:  temperature >100.4   Complete  by: As directed    Discharge instructions   Complete by: As directed    Please review instructions on the discharge summary.  You were cared for by a hospitalist during your hospital stay. If you have any questions about your discharge medications or the care you received while you were in the hospital after you are discharged, you can call the unit and asked to speak with the hospitalist on call if the hospitalist that took care of you is not available. Once you are discharged, your primary care physician will handle any further medical issues. Please note that NO REFILLS for any discharge medications will be authorized once you are discharged, as it is imperative that you return to your primary care physician (or establish a relationship with a primary care physician if you do not have one) for your aftercare needs so that they can reassess your need for medications and monitor your lab values. If you do not have a primary care physician, you can call [phone] for a physician referral.   Increase activity slowly   Complete by: As directed         Allergies as of 04/02/2020   No Known Allergies     Medication List    STOP taking these medications   amLODipine-olmesartan 10-40 MG tablet Commonly known as: AZOR   spironolactone 25 MG tablet Commonly known as: ALDACTONE     TAKE these medications   (feeding supplement) PROSource Plus liquid Take 30 mLs by mouth 2 (two) times daily between meals.   feeding supplement Liqd Take 237 mLs by mouth 2 (two) times daily between meals.   dexamethasone 2 MG tablet Commonly known as: DECADRON Take 4mg  daily for 1 week and then 2mg  daily for 1 week and then STOP.   dexlansoprazole 60 MG capsule Commonly known as: DEXILANT Take 60 mg by mouth daily.   HYDROcodone-acetaminophen 5-325 MG tablet Commonly known as: NORCO/VICODIN Take 1 tablet by mouth every 6 (six) hours as needed for severe pain.   montelukast 10 MG tablet Commonly known  as: SINGULAIR Take 10 mg by mouth at bedtime.   multivitamin with minerals Tabs tablet Take 1 tablet by mouth daily.   nebivolol 10 MG tablet Commonly known as: BYSTOLIC Take 10 mg by mouth daily.   potassium chloride  20 MEQ packet Commonly known as: KLOR-CON Take 40 mEq by mouth daily. Start taking on: April 03, 2020   primidone 50 MG tablet Commonly known as: MYSOLINE Take 50 mg by mouth 3 (three) times daily.   Rivaroxaban 15 MG Tabs tablet Commonly known as: XARELTO Take 1 tablet (15 mg total) by mouth 2 (two) times daily with a meal for 21 days.   rivaroxaban 20 MG Tabs tablet Commonly known as: XARELTO Take 1 tablet (20 mg total) by mouth daily with supper. Start after completing the twice daily dosing for 3 weeks Start taking on: April 18, 2020   rosuvastatin 20 MG tablet Commonly known as: CRESTOR Take 20 mg by mouth daily.         Contact information for follow-up providers    Lucianne Lei, MD. Schedule an appointment as soon as possible for a visit in 1 week(s).   Specialty: Family Medicine Contact information: [address] [place] [address] [phone]            Contact information for after-discharge care    Destination    [facility] Preferred SNF .   Service: Skilled Nursing Contact information: [address] [place] [phone]                  TOTAL DISCHARGE TIME: 66 minutes  [place]  [facility] Pager on [email or web address]  04/02/2020, 10:12 AM

## 2020-04-02 NOTE — TOC Progression Note (Signed)
Transition of Care [facility]) - Progression Note    Patient Details  Name: Amanda Rivers MRN: [id number] Date of Birth: 1945-12-07  [facility] CM/SW Contact  Purcell Mouton, RN Phone Number: 04/02/2020, 2:27 PM  Clinical Narrative:     [name] pt's daughter appealed discharge again. Information was given to [facility] concerning appeal and [id number] was verbally given to [facility].   [facility] Admission Coordinator called for update. Informed Coordinator that discharge was on hold related to an Appeal at present time.   Expected Discharge Plan: (P) Skilled Nursing Facility Barriers to Discharge: (P) Continued Medical Work up  Expected Discharge Plan and Services Expected Discharge Plan: (P) [place]       Living arrangements for the past 2 months: Single Family Home Expected Discharge Date: 03/30/20                                     Social Determinants of Health (SDOH) Interventions    Readmission Risk Interventions No flowsheet data found.

## 2020-04-02 NOTE — Care Management Important Message (Signed)
Important Message  Patient Details  Name: Fumi Guadron MRN: [id number] Date of Birth: 04-Oct-1945   Medicare Important Message Given:  Yes (Medicare IM printed for Cookie McGibboney NCM to give to the patient.)  [employer] Appeal Detailed Notice of Discharge letter created and saved: Yes Detailed Notice of Discharge Document Given to [employer]: Yes (delivered by Cookie P, NCM) [employer] ROI Document Created: Yes [employer] appeal documents uploaded to [employer] stite: Yes  Paulette Lynch P Jonmarc Bodkin 04/02/2020, 1:45 PM

## 2020-04-02 NOTE — Plan of Care (Signed)

## 2020-04-02 NOTE — Progress Notes (Signed)
ANTICOAGULATION CONSULT NOTE - Initial Consult  Pharmacy Consult for rivaroxaban (Xarelto) Indication: DVT  No Known Allergies  Patient Measurements: Height: 5' 11.5" (181.6 cm) Weight: 66.1 kg (145 lb 11.6 oz) IBW/kg (Calculated) : 71.95  Vital Signs:    Labs: Recent Labs    03/31/20 0447 04/01/20 0449 04/02/20 0454  HGB 10.1*  --   --   HCT 31.4*  --   --   PLT 162  --   --   CREATININE 0.91 0.79 0.86    Estimated Creatinine Clearance: 59.9 mL/min (by C-G formula based on SCr of 0.86 mg/dL).   Medical History: Past Medical History:  Diagnosis Date  . HLD (hyperlipidemia)   . Hypertension   . Malignant neoplasm of bronchus and lung, unspecified site   . Reflux     Medications:  No PTA anticoagulation.  Heparin initiated on 2/4 and currently infusing at 1100 units/hour.  Assessment: Pharmacy consulted to dose rivaroxaban for acute bilateral lower extremity DVT.  Estimated creatinine clearance greater than 30 ml/min.  Goal of Therapy:  DVT treatment Monitor platelets by anticoagulation protocol: Yes   Plan:  Rivaroxaban 15mg  bid x 21 days > 2/26, then 20mg  daily with supper from 2/27 2/7 Educated patient on Rivaroxaban,   Minda Ditto PharmD 04/02/2020, 7:38 AM

## 2020-04-02 NOTE — Care Management Important Message (Signed)
Medicare IM printed for Amanda Rivers NCM to give to the patient.

## 2020-04-02 NOTE — TOC Progression Note (Signed)
Transition of Care [facility]) - Progression Note    Patient Details  Name: Amanda Rivers MRN: [id number] Date of Birth: 1945/07/13  Transition of Care [facility]) CM/SW Contact  Purcell Mouton, RN Phone Number: 04/02/2020, 9:49 AM  Clinical Narrative:    Spoke with pt's daughter [name] concerning discharge plan this AM at 9:07. Asked [name] if she wanted her mother to transfer to [facility]; [facility]. [name] stated no, that she won her case with [employer]. [name] continued to say I want her to get stronger.  Explained to [name] that she can Appeal the discharge like she did before. CM stated to [facility] that she will need the second (IM) Important Message. [name] asked why she had to appeal again. Explained to her that her mother was medically stable for discharge and she would need to ask Judithann Graves why she had to appeal again.   9:25 [name] called Judithann Graves again to appeal discharge. [name] also asked how much it would cost to transport mother to [place]. The cost by PTAR $2,146.21. During second call to [name], the IM was given to her verbally and will e-mail to her ([email or web address]). [name] stated she received [employer] denial letter that was e-mailed to her from CM. Explained to [name] that in a SNF Rehab, her mother would get stronger with more PT.   9:38 [name] called back-This CM asked [name] when was she able to come see her mother. [name] stated she  (Pt) is in isolation because of PNA. This CM expalined she was not in isolation. [name] did not want to continue this conversation.   10:10 [name] return call asking about transportation. Informed her of the transportation company.    Expected Discharge Plan: (P) Skilled Nursing Facility Barriers to Discharge: (P) Continued Medical Work up  Expected Discharge Plan and Services Expected Discharge Plan: (P) [place]       Living arrangements for the past 2 months: Single Family Home Expected Discharge Date: 03/30/20                                      Social Determinants of Health (SDOH) Interventions    Readmission Risk Interventions No flowsheet data found.

## 2020-04-03 DIAGNOSIS — E876 Hypokalemia: Secondary | ICD-10-CM

## 2020-04-03 LAB — BASIC METABOLIC PANEL
Anion gap: 13 (ref 5–15)
BUN: 17 mg/dL (ref 8–23)
CO2: 25 mmol/L (ref 22–32)
Calcium: 8.3 mg/dL — ABNORMAL LOW (ref 8.9–10.3)
Chloride: 101 mmol/L (ref 98–111)
Creatinine, Ser: 0.85 mg/dL (ref 0.44–1.00)
GFR, Estimated: >60 mL/min (ref >60)
Glucose, Bld: 94 mg/dL (ref 70–99)
Potassium: 3.2 mmol/L — ABNORMAL LOW (ref 3.5–5.1)
Sodium: 139 mmol/L (ref 135–145)

## 2020-04-03 LAB — CBC
HCT: 31.2 % — ABNORMAL LOW (ref 36.0–46.0)
Hemoglobin: 9.8 g/dL — ABNORMAL LOW (ref 12.0–15.0)
MCH: 24.4 pg — ABNORMAL LOW (ref 26.0–34.0)
MCHC: 31.4 g/dL (ref 30.0–36.0)
MCV: 77.8 fL — ABNORMAL LOW (ref 80.0–100.0)
Platelets: 134 10*3/uL — ABNORMAL LOW (ref 150–400)
RBC: 4.01 MIL/uL (ref 3.87–5.11)
RDW: 15.8 % — ABNORMAL HIGH (ref 11.5–15.5)
WBC: 9.6 10*3/uL (ref 4.0–10.5)
nRBC: 0 % (ref 0.0–0.2)

## 2020-04-03 LAB — GLUCOSE, CAPILLARY
Glucose-Capillary: 86 mg/dL (ref 70–99)
Glucose-Capillary: 135 mg/dL — ABNORMAL HIGH (ref 70–99)
Glucose-Capillary: 120 mg/dL — ABNORMAL HIGH (ref 70–99)

## 2020-04-03 LAB — MAGNESIUM: Magnesium: 1.7 mg/dL (ref 1.7–2.4)

## 2020-04-03 MED ORDER — MAGNESIUM SULFATE 2 GM/50ML IV SOLN
2.0000 g | Freq: Once | INTRAVENOUS | Status: AC
  Administered 2020-04-03: 2 g via INTRAVENOUS
  Filled 2020-04-03: qty 50

## 2020-04-03 MED ORDER — POTASSIUM CHLORIDE CRYS ER 20 MEQ PO TBCR
40.0000 meq | EXTENDED_RELEASE_TABLET | Freq: Once | ORAL | Status: AC
  Administered 2020-04-03: 40 meq via ORAL
  Filled 2020-04-03: qty 2

## 2020-04-03 NOTE — Progress Notes (Signed)
No overnight events noted.  Patient denies any new complaints.  States that she does not like the food here in the hospital.  Has been drinking Ensure and boost.  Denies any complaints.  No shortness of breath or chest pain.  No nausea vomiting  Vital signs noted to be stable. Lungs are clear to auscultation bilaterally. S1-S2 is normal regular. Abdomen is soft nontender nondistended.  Continue daily potassium supplementation.  Will give additional dose this afternoon.  Recheck labs tomorrow.  Magnesium noted to be 1.7.  Give a dose of magnesium sulfate.  Patient remains stable.  Heart rate noted to be much better.  Please review discharge summary for all of the other active issues.  Patient has been discharged but family has appealed this process.  She needs to go to skilled nursing facility.  Case manager social worker has been talking with patient daughter to try and sort this out.  Bonnielee Haff 04/03/2020

## 2020-04-03 NOTE — Plan of Care (Signed)

## 2020-04-04 DIAGNOSIS — R531 Weakness: Secondary | ICD-10-CM | POA: Diagnosis not present

## 2020-04-04 DIAGNOSIS — J189 Pneumonia, unspecified organism: Secondary | ICD-10-CM | POA: Diagnosis not present

## 2020-04-04 LAB — GLUCOSE, CAPILLARY
Glucose-Capillary: 94 mg/dL (ref 70–99)
Glucose-Capillary: 123 mg/dL — ABNORMAL HIGH (ref 70–99)
Glucose-Capillary: 162 mg/dL — ABNORMAL HIGH (ref 70–99)

## 2020-04-04 LAB — MAGNESIUM: Magnesium: 2.3 mg/dL (ref 1.7–2.4)

## 2020-04-04 LAB — BASIC METABOLIC PANEL
Anion gap: 13 (ref 5–15)
BUN: 18 mg/dL (ref 8–23)
CO2: 25 mmol/L (ref 22–32)
Calcium: 8.6 mg/dL — ABNORMAL LOW (ref 8.9–10.3)
Chloride: 100 mmol/L (ref 98–111)
Creatinine, Ser: 0.91 mg/dL (ref 0.44–1.00)
GFR, Estimated: >60 mL/min (ref >60)
Glucose, Bld: 113 mg/dL — ABNORMAL HIGH (ref 70–99)
Potassium: 4 mmol/L (ref 3.5–5.1)
Sodium: 138 mmol/L (ref 135–145)

## 2020-04-04 NOTE — Progress Notes (Signed)
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 Brought in due to failure to thrive. Patient was found to have pneumonia. Also found to have acute DVT. Hospitalized for further management.  Patient was discharged home but family has appealed for discharge to skilled nursing facility.  Assessment & Plan:   Active Problems:   Pneumonia   Palliative care by specialist   Goals of care, counseling/discussion   General weakness   Community acquired pneumonia of left lower lobe of lung   Shortness of breath  Left upper lobe pneumonia/sepsis POA/possible radiation pneumonitis Patient presented with tachycardia,  Tachypnea,  leukocytosis, extensive infiltrate left upper lobe. Tuberculosis was a consideration.Patient seen by pulmonology who did not think that this was tuberculosis.Isolation precautions were discontinued. Patient was placed on ceftriaxone and azithromycin.   She has completed course of antibiotics. Patient started on steroids by pulmonology due to concern for radiation pneumonitis. Continue dexamethasone with taper over 2 weeks.  AKI/normal anion gap metabolic acidosis/hypokalemia and hypomagnesemia Baseline creatinine 1-1.2. Presented with creatinine of 2.2.  Improved with IV hydration.   Potassium being supplemented daily.  Check labs in 1 week.  Sinus tachycardia Likely due to anxiety as well as rebound tachycardia from being off of Bystolic.  Heart rate now improved.  Continue Bystolic at current dose.     Failure to thrive x3 weeks Patient has had poor oral intake. Apparently has been going downhill since she was started on radiation treatment about a month ago. Abdominal ultrasound was done which showed gallbladder sludge without any gallstones or  hydronephrosis.  Oral intake has improved some.  Unlikely she will fully get back to normal due to anorexia from her cancer.  Continue to encourage oral intake.    Oropharyngeal dysphagia Aspiration precautions.  Seen by speech therapy.  On dysphagia 2 diet with thin liquids.  Acute metabolic encephalopathy >>> Improving Likely due to metabolic derangements and dehydration. Seems to be improved.   Acute bilateral lower extremity DVT Patient had bilateral leg pain and D-dimer more than 20 which prompted duplex of the legwhich revealed bilateral DVT.  Discussed w/ her oncologist Dr Chryl Heck, and also with patient's daughter. Initially treated with IV heparin.  Transitioned to rivaroxaban.  Will likely need lifelong anticoagulation due to cancer.  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 Isidore Moos and Dr. Chryl Heck. Last radiation 03/05/2020 and going down hill since. Per oncology Dr. Chryl Heck- patient refused chemo and radiation, and refused further work-up of pulmonary nodules with bronchoscopy.  Palliative care was consulted here in the hospital.  Would recommend that they follow at SNF as well.  Thrombocytopenia >> Resolved. Possibly due to consumption.This has resolved.  Anemia of chronic disease Hemoglobin low but stable.No overt bleeding noted.   New onset prediabetes w/ hyperglycemia HbA1c 6.3. Elevated glucose levelscould be due to steroids.   Monitor CBGs once daily.  Abnormal LFTs Abdominal ultrasound did not show any acute findings. LFTs stable.  Check periodically.  Positive hepatitis C antibody,likely false positive HCV quantitative test did not detect any virus.  Goals of care Patient noted to be DNR/DNI after pulmonology discussed patient's condition with patient's family.  Patient remained stable.  Okay for discharge to SNF when bed is available.    DVT prophylaxis:  Xarelto Code Status:  DNR Family  Communication: No family at bed side. Disposition Plan:  Status is: Inpatient  Remains inpatient appropriate because:IV treatments appropriate due to intensity of illness or inability to take PO   Dispo:  Patient From: Home  Planned Disposition: [place]  Expected discharge date: 04/05/2020  Medically stable for discharge: No        Consultants:     Procedures:  Antimicrobials:   Anti-infectives (From admission, onward)   Start     Dose/Rate Route Frequency Ordered Stop   03/30/20 0000  amoxicillin-clavulanate (AUGMENTIN) 875-125 MG tablet  Status:  Discontinued        1 tablet Oral Every 12 hours 03/30/20 0906 04/02/20    03/29/20 1000  amoxicillin-clavulanate (AUGMENTIN) 875-125 MG per tablet 1 tablet        1 tablet Oral Every 12 hours 03/28/20 1020 03/31/20 2128   03/29/20 1000  azithromycin (ZITHROMAX) tablet 500 mg        500 mg Oral Daily 03/28/20 1738 03/30/20 0857   03/26/20 1000  cefTRIAXone (ROCEPHIN) 2 g in sodium chloride 0.9 % 100 mL IVPB  Status:  Discontinued        2 g 200 mL/hr over 30 Minutes Intravenous Every 24 hours 03/25/20 1850 03/28/20 1020   03/26/20 1000  azithromycin (ZITHROMAX) 500 mg in sodium chloride 0.9 % 250 mL IVPB  Status:  Discontinued        500 mg 250 mL/hr over 60 Minutes Intravenous Every 24 hours 03/25/20 1850 03/28/20 1738   03/25/20 1615  azithromycin (ZITHROMAX) 500 mg in sodium chloride 0.9 % 250 mL IVPB        500 mg 250 mL/hr over 60 Minutes Intravenous  Once 03/25/20 1609 03/25/20 1941   03/25/20 1400  cefTRIAXone (ROCEPHIN) 1 g in sodium chloride 0.9 % 100 mL IVPB        1 g 200 mL/hr over 30 Minutes Intravenous  Once 03/25/20 1355 03/25/20 1636     Subjective: Patient was seen and examined at bedside she was sitting on the recliner denies any concerns.  She reports feeling better she is on oxygen via nasal cannula saturating 94%.  Objective: Vitals:   04/03/20 1206 04/03/20 2013 04/04/20 0427  04/04/20 1140  BP: 102/65 125/72 137/79 109/70  Pulse: 82 85 93 (!) 106  Resp:  16 18 16   Temp: 97.7 F (36.5 C) 97.7 F (36.5 C) 97.6 F (36.4 C) 98.9 F (37.2 C)  TempSrc: Oral Oral Oral Oral  SpO2: 96% 98% 91% 95%  Weight:      Height:        Intake/Output Summary (Last 24 hours) at 04/04/2020 1427 Last data filed at 04/04/2020 0947 Gross per 24 hour  Intake 0 ml  Output --  Net 0 ml   Filed Weights   03/25/20 1330 03/25/20 2155  Weight: 66.2 kg 66.1 kg    Examination:  General exam: Appears calm and comfortable, not in any acute distress. Respiratory system: Clear to auscultation. Respiratory effort normal. Cardiovascular system: S1 & S2 heard, RRR. No JVD, murmurs, rubs, gallops or clicks. No pedal edema. Gastrointestinal system: Abdomen is nondistended, soft and nontender. No organomegaly or masses felt. Normal bowel sounds heard. Central nervous system: Alert and oriented. No focal neurological deficits. Extremities: Symmetric 5 x 5 power. No edema, no cyanosis, no clubbing. Skin: No rashes, lesions or ulcers Psychiatry: Judgement and insight appear normal. Mood & affect appropriate.     Data Reviewed: I have personally  reviewed following labs and imaging studies  CBC: Recent Labs  Lab 03/29/20 0413 03/30/20 0439 03/31/20 0447 04/03/20 0512  WBC 8.6 10.4 9.2 9.6  HGB 8.3* 9.9* 10.1* 9.8*  HCT 26.8* 31.8* 31.4* 31.2*  MCV 78.8* 78.9* 77.0* 77.8*  PLT 157 167 162 008*   Basic Metabolic Panel: Recent Labs  Lab 03/29/20 0413 03/30/20 0439 03/31/20 0447 04/01/20 0449 04/02/20 0454 04/03/20 0512 04/03/20 0515 04/04/20 0954  NA 138 139 138 137 138 139  --  138  K 3.3* 4.4 3.4* 3.3* 3.4* 3.2*  --  4.0  CL 107 107 105 101 101 101  --  100  CO2 20* 23 22 22 25 25   --  25  GLUCOSE 103* 92 107* 111* 98 94  --  113*  BUN 17 17 16 12 15 17   --  18  CREATININE 0.88 0.96 0.91 0.79 0.86 0.85  --  0.91  CALCIUM 8.2* 8.7* 8.7* 8.6* 8.7* 8.3*  --  8.6*  MG  1.4* 2.2  --   --   --   --  1.7 2.3   GFR: Estimated Creatinine Clearance: 56.6 mL/min (by C-G formula based on SCr of 0.91 mg/dL). Liver Function Tests: Recent Labs  Lab 03/29/20 0413  AST 84*  ALT 65*  ALKPHOS 61  BILITOT 0.5  PROT 5.3*  ALBUMIN 2.1*   No results for input(s): LIPASE, AMYLASE in the last 168 hours. No results for input(s): AMMONIA in the last 168 hours. Coagulation Profile: No results for input(s): INR, PROTIME in the last 168 hours. Cardiac Enzymes: No results for input(s): CKTOTAL, CKMB, CKMBINDEX, TROPONINI in the last 168 hours. BNP (last 3 results) No results for input(s): PROBNP in the last 8760 hours. HbA1C: No results for input(s): HGBA1C in the last 72 hours. CBG: Recent Labs  Lab 04/03/20 0806 04/03/20 1210 04/03/20 1646 04/04/20 0758 04/04/20 1137  GLUCAP 86 135* 120* 94 123*   Lipid Profile: No results for input(s): CHOL, HDL, LDLCALC, TRIG, CHOLHDL, LDLDIRECT in the last 72 hours. Thyroid Function Tests: No results for input(s): TSH, T4TOTAL, FREET4, T3FREE, THYROIDAB in the last 72 hours. Anemia Panel: No results for input(s): VITAMINB12, FOLATE, FERRITIN, TIBC, IRON, RETICCTPCT in the last 72 hours. Sepsis Labs: No results for input(s): PROCALCITON, LATICACIDVEN in the last 168 hours.  Recent Results (from the past 240 hour(s))  Urine culture     Status: Abnormal   Collection Time: 03/25/20  4:40 PM   Specimen: In/Out Cath Urine  Result Value Ref Range Status   Specimen Description   Final    IN/OUT CATH URINE Performed at [facility] [address], [place], [place] [address]    Special Requests   Final    NONE Performed at [facility], [place] [address], [place], [place] [address]    Culture MULTIPLE SPECIES PRESENT, SUGGEST RECOLLECTION (A)  Final   Report Status 03/27/2020 FINAL  Final  Blood culture (routine single)     Status: None   Collection Time: 03/26/20  7:42 AM    Specimen: BLOOD  Result Value Ref Range Status   Specimen Description   Final    BLOOD BLOOD LEFT FOREARM Performed at [place] [facility] [address], [place], [place] [address]    Special Requests   Final    BOTTLES DRAWN AEROBIC ONLY Blood Culture results may not be optimal due to an inadequate volume of blood received in culture bottles Performed at [facility], [place] [address] [place], [place] [address]    Culture   Final    NO GROWTH 5 DAYS Performed at [facility], [place] [address], [place], [place] [address]    Report Status 03/31/2020 FINAL  Final    Radiology Studies: No results found.  Scheduled Meds: . (feeding supplement) PROSource Plus  30 mL Oral BID BM  . dexamethasone  4 mg Oral Daily  . feeding supplement  1 Container Oral Q24H  . feeding supplement  237 mL Oral BID BM  . montelukast  10 mg Oral QHS  . multivitamin with minerals  1 tablet Oral Daily  . nebivolol  10 mg Oral Daily  . pantoprazole  40 mg Oral Daily  . potassium chloride  40 mEq Oral Daily  . primidone  50 mg Oral TID  . Rivaroxaban  15 mg Oral BID WC   Followed by  . [START ON 04/18/2020] rivaroxaban  20 mg Oral Q supper  . rosuvastatin  20 mg Oral Daily   Continuous Infusions:   LOS: 10 days    Time spent: 35 mins    Macie Baum, MD [facility]   If 7PM-7AM, please contact night-coverage

## 2020-04-04 NOTE — Plan of Care (Signed)

## 2020-04-05 ENCOUNTER — Ambulatory Visit: Payer: Medicare HMO

## 2020-04-05 LAB — HEMOGLOBIN AND HEMATOCRIT, BLOOD
HCT: 29.6 % — ABNORMAL LOW (ref 36.0–46.0)
Hemoglobin: 9.4 g/dL — ABNORMAL LOW (ref 12.0–15.0)

## 2020-04-05 MED ORDER — BOOST / RESOURCE BREEZE PO LIQD CUSTOM
1.0000 | Freq: Two times a day (BID) | ORAL | Status: DC
  Administered 2020-04-05 – 2020-04-09 (×8): 1 via ORAL

## 2020-04-05 MED ORDER — PROSOURCE PLUS PO LIQD
30.0000 mL | Freq: Three times a day (TID) | ORAL | Status: DC
  Administered 2020-04-05 – 2020-04-09 (×9): 30 mL via ORAL
  Filled 2020-04-05 (×9): qty 30

## 2020-04-05 NOTE — Progress Notes (Signed)
Physical Therapy Treatment Patient Details Name: Amanda Rivers MRN: [id number] DOB: 07/19/1945 Today's Date: 04/05/2020    History of Present Illness 75 year old female with HTN, HLD, oral cancer on radiation (last dose ~ 3 wks ago).  Brought in due to failure to thrive.  Patient was found to have pneumonia and acute DVT    PT Comments    Pt was OOB in recliner incont urine.  Assisted to bathroom.  General transfer comment: required increased assist to complete sit to stand due to weakness and "bad" B shoulders.  Also perfomed a toilet transfer in which pt required MAX Assist to rise from. Assisted with peri care as pt was unable to self perform and maintain a safe standing balance.  Uncontrolled decens as well.  Weak. General Gait Details: assist for stability esp with turns. 25% VC's on proper upright posture.  Limited amb distance due to dyspnea.  Avg RA > 90% and HR 88.  Pt stated "I feel tired".  RA at rest was 94% and RA with amb avg 96%.  Pt lives home alone and plans to D/C to SNF.   Follow Up Recommendations  SNF     Equipment Recommendations  None recommended by PT    Recommendations for Other Services       Precautions / Restrictions Precautions Precautions: Fall Precaution Comments: monitor O2 and HR Restrictions Weight Bearing Restrictions: No    Mobility  Bed Mobility Overal bed mobility: Needs Assistance Bed Mobility: Supine to Sit     Supine to sit: Mod assist;HOB elevated     General bed mobility comments: OOB in recliner    Transfers Overall transfer level: Needs assistance Equipment used: Rolling walker (2 wheeled) Transfers: Sit to/from [facility] Sit to Stand: Mod assist Stand pivot transfers: Min assist       General transfer comment: required increased assist to complete sit to stand due to weakness and "bad" B shoulders.  Also perfomed a toilet transfer in which pt required MAX Assist to rise from.  Uncontrolled decens as  well.  Weak.  Ambulation/Gait Ambulation/Gait assistance: Min assist Gait Distance (Feet): 22 Feet (to and from bathroom) Assistive device: Rolling walker (2 wheeled) Gait Pattern/deviations: Step-through pattern;Decreased stride length Gait velocity: decreased   General Gait Details: assist for stability esp with turns. 25% VC's on proper upright posture.  Limited amb distance due to dyspnea.  Avg RA > 90% and HR 88.  Pt stated "I feel tiered". .   Stairs             Wheelchair Mobility    Modified Rankin (Stroke Patients Only)       Balance Overall balance assessment: Mild deficits observed, not formally tested Sitting-balance support: Feet supported;Single extremity supported Sitting balance-Leahy Scale: Fair     Standing balance support: Bilateral upper extremity supported Standing balance-Leahy Scale: Poor Standing balance comment: reliant on external support                            Cognition Arousal/Alertness: Awake/alert Behavior During Therapy: WFL for tasks assessed/performed Overall Cognitive Status: Within Functional Limits for tasks assessed                                 General Comments: AxO x 3 feels very tired/slightly groggy this morning      Exercises      General Comments  Pertinent Vitals/Pain Pain Assessment: No/denies pain Faces Pain Scale: Hurts a little bit Pain Location: My legs.  Pt requesting OOB. Pain Intervention(s): Limited activity within patient's tolerance;Monitored during session;Repositioned    Home Living                      Prior Function            PT Goals (current goals can now be found in the care plan section) Progress towards PT goals: Progressing toward goals    Frequency    Min 2X/week      PT Plan Current plan remains appropriate    Co-evaluation              AM-PAC PT "6 Clicks" Mobility   Outcome Measure  Help needed turning from your  back to your side while in a flat bed without using bedrails?: A Little Help needed moving from lying on your back to sitting on the side of a flat bed without using bedrails?: A Little Help needed moving to and from a bed to a chair (including a wheelchair)?: A Lot Help needed standing up from a chair using your arms (e.g., wheelchair or bedside chair)?: A Lot Help needed to walk in hospital room?: A Lot Help needed climbing 3-5 steps with a railing? : A Lot 6 Click Score: 14    End of Session Equipment Utilized During Treatment: Gait belt Activity Tolerance: Patient tolerated treatment well Patient left: in chair;with call bell/phone within reach;with chair alarm set Nurse Communication: Mobility status PT Visit Diagnosis: Other abnormalities of gait and mobility (R26.89)     Time: 8101-7510 PT Time Calculation (min) (ACUTE ONLY): 26 min  Charges:  $Gait Training: 8-22 mins $Therapeutic Exercise: 8-22 mins                     Rica Koyanagi  PTA Acute  Rehabilitation Services Pager      [phone] Office      [phone]

## 2020-04-05 NOTE — Progress Notes (Signed)
Nutrition Follow-up  DOCUMENTATION CODES:   Not applicable  INTERVENTION:  - weigh patient today.  - continue Ensure Enlive BID and Magic Cup BID. - will increase 30 ml Prosource Plus from BID to TID and Boost Breeze from once/day to BID.   NUTRITION DIAGNOSIS:   Increased nutrient needs related to acute illness,catabolic illness,cancer and cancer related treatments as evidenced by estimated needs. -ongoing  GOAL:   Patient will meet greater than or equal to 90% of their needs -unmet  MONITOR:   PO intake,Supplement acceptance,Labs,Weight trends  ASSESSMENT:   75 year old female with medical hx of HTN, HLD, history of oral cancer on XRT and had her last treatment almost 3 weeks ago. She presented to the ED with 3 weeks of FTT, eating and drinking poorly (1/2 cup soup was all she ate in 3 days PTA), and incontinent. Patient is a poor historian and information was obtained from her granddaughter who reported that patient has been becoming progressively weak and having more difficulty moving around. Granddaughter also reported that patient complained for leg weakness and leg pain. Family noted some difficulty breathing and cough. In the ED, she was found to have AKI and CXR concerning for PNA. There is also concern for TB; rule out pending.  Recently documented PO intakes of meals are 25% of breakfast on 2/9; 0% of lunch on 2/10; 0% of dinner on 2/11; 25% of breakfast and 0% of dinner on 2/12; 0% of breakfast on 2/13; 5% of breakfast this AM.   She has been accepting Ensure 50% of the time offered, Boost Breeze and Prosource Plus 100% of the time offered. Intakes of these supplements provides daily, on average, 800 kcal and 59 grams protein.   She has not been weighed since admission on 2/3. No information documented in the edema section of flow sheet.   Per MD note yesterday, likely d/c date of today (2/14). Plan is for d/c to SNF.     Labs reviewed; Ca: 8.6 mg/dl.  Medications  reviewed; 1 tablet multivitamin with minerals/day, 40 mEq Klor-Con/day.   Diet Order:   Diet Order            DIET DYS 2 Room service appropriate? Yes; Fluid consistency: Thin  Diet effective now                 EDUCATION NEEDS:   Not appropriate for education at this time  Skin:  Skin Assessment: Reviewed RN Assessment  Last BM:  2/14 at 1000  Height:   Ht Readings from Last 1 Encounters:  03/25/20 5' 11.5" (1.816 m)    Weight:   Wt Readings from Last 1 Encounters:  03/25/20 66.1 kg     Estimated Nutritional Needs:  Kcal:  2000-2200 kcal Protein:  100-115 grams Fluid:  >/= 2.3 L/day     Jarome Matin, MS, RD, LDN, CNSC Inpatient Clinical Dietitian RD pager # available in [facility]  After hours/weekend pager # available in [facility]

## 2020-04-05 NOTE — Progress Notes (Signed)
ANTICOAGULATION CONSULT NOTE - Initial Consult  Pharmacy Consult for rivaroxaban (Xarelto) Indication: DVT  No Known Allergies  Patient Measurements: Height: 5' 11.5" (181.6 cm) Weight: 66.1 kg (145 lb 11.6 oz) IBW/kg (Calculated) : 71.95  Vital Signs: Temp: 99.4 F (37.4 C) (02/14 0414) Temp Source: Oral (02/14 0414) BP: 112/72 (02/14 0414) Pulse Rate: 108 (02/14 0414)  Labs: Recent Labs    04/03/20 0512 04/04/20 0954 04/05/20 0300  HGB 9.8*  --  9.4*  HCT 31.2*  --  29.6*  PLT 134*  --   --   CREATININE 0.85 0.91  --     Estimated Creatinine Clearance: 56.6 mL/min (by C-G formula based on SCr of 0.91 mg/dL).   Medical History: Past Medical History:  Diagnosis Date  . HLD (hyperlipidemia)   . Hypertension   . Malignant neoplasm of bronchus and lung, unspecified site   . Reflux     Medications:  No PTA anticoagulation.  Heparin initiated on 2/4 and currently infusing at 1100 units/hour.  Assessment: Pharmacy consulted to dose rivaroxaban for acute bilateral lower extremity DVT.  Estimated creatinine clearance greater than 30 ml/min.  Today 04/05/20  - Hgb 9.4, stable - No bleeding reported  Goal of Therapy:  DVT treatment Monitor platelets by anticoagulation protocol: Yes   Plan:  Continue rivaroxaban 15mg  bid x 21 days > 2/26, then 20mg  daily with supper from 2/27 2/7 Educated patient on Rivaroxaban and potential drug interaction with primidone Will sign off and follow remotely Thank you for the consult   Napoleon Form  04/05/2020, 7:31 AM

## 2020-04-05 NOTE — Progress Notes (Signed)
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  Brought in due to failure to thrive.  Patient was found to have pneumonia and acute DVT   OT comments  Patient progressing slowly but steadily and showed improved steadiness with functional mobility and improved ability to feed herself with use of adaptive foam grip to utensils, compared to previous session. Patient remains limited by generalized weakness, decreased activity tolerance, LE pain, and decreased initiation of self care, along with deficits noted below. Pt continues to demonstrate good rehab potential and would benefit from continued skilled OT to increase safety and independence with ADLs and functional transfers to allow pt to return home safely and reduce caregiver burden and fall risk.   Follow Up Recommendations  SNF    Equipment Recommendations  None recommended by OT    Recommendations for Other Services      Precautions / Restrictions Precautions Precautions: Fall Precaution Comments: monitor o2 and HR Restrictions Weight Bearing Restrictions: No       Mobility Bed Mobility Overal bed mobility: Needs Assistance Bed Mobility: Supine to Sit     Supine to sit: Mod assist;HOB elevated     General bed mobility comments: Use of hand rails, assistance for both LEs and trunk needed. Cues for sequencing.  Transfers Overall transfer level: Needs assistance Equipment used: Rolling walker (2 wheeled) Transfers: Sit to/from [facility] Sit to Stand: Min assist Stand pivot transfers: Min assist       General transfer comment: Pt stood from EOB with Min As. Pivoted to recliner with RW and Min As. Cues for UE reach back before sitting but did not follow and required Min As for controlled descent. Pt declined  further mobility due to fatigue.    Balance Overall balance assessment: Mild deficits observed, not formally tested Sitting-balance support: Feet supported;Single extremity supported Sitting balance-Leahy Scale: Fair     Standing balance support: Bilateral upper extremity supported Standing balance-Leahy Scale: Poor Standing balance comment: reliant on external support                           ADL either performed or assessed with clinical judgement   ADL Overall ADL's : Needs assistance/impaired Eating/Feeding: Minimal assistance;Set up;Sitting;With adaptive utensils Eating/Feeding Details (indicate cue type and reason): Pt positioned in recliner and required full setup with difficulty gripping fork due to tremor and weakness. Pt provided with adaptive foam grip to fork and able to grasp utensil adequatly to bring food to mouth. Pt with poor ROM to RT shoulder and required RT elbow support on 3 pillows in order to reach fork to tray. Grooming: Set up;Wash/dry hands;Sitting Grooming Details (indicate cue type and reason): Setup with warm washcloth and cues to clean hands prior to eating.                   Toilet Transfer Details (indicate cue type and reason): Pt denied need for toilet. Mobilized to recliner. See Mobility section.         Functional mobility during ADLs: Minimal assistance;Cueing for safety;Cueing for sequencing;Rolling walker General ADL Comments: patient requiring assistance with self care due to weakness, decreased endurance, decreased initiation, and safety awareness     Vision Patient Visual Report: No change from baseline     Perception  Praxis      Cognition Arousal/Alertness: Awake/alert Behavior During Therapy: WFL for tasks assessed/performed Overall Cognitive Status: No family/caregiver present to determine baseline cognitive functioning                                 General Comments: Oriented to person and  place only today. Pt unable to state correct month despite cue/hint of "Valentine's Day". Unable to state year or situation.        Exercises     Shoulder Instructions       General Comments      Pertinent Vitals/ Pain       Pain Assessment: Faces Faces Pain Scale: Hurts a little bit Pain Location: My legs.  Pt requesting OOB. Pain Intervention(s): Limited activity within patient's tolerance;Monitored during session;Repositioned  Home Living                                          Prior Functioning/Environment              Frequency  Min 2X/week        Progress Toward Goals  OT Goals(current goals can now be found in the care plan section)  Progress towards OT goals: Progressing toward goals     Plan Discharge plan remains appropriate    Co-evaluation                 AM-PAC OT "6 Clicks" Daily Activity     Outcome Measure   Help from another person eating meals?: A Little Help from another person taking care of personal grooming?: A Little Help from another person toileting, which includes using toliet, bedpan, or urinal?: A Lot Help from another person bathing (including washing, rinsing, drying)?: A Lot Help from another person to put on and taking off regular upper body clothing?: A Lot Help from another person to put on and taking off regular lower body clothing?: A Lot 6 Click Score: 14    End of Session Equipment Utilized During Treatment: Rolling walker;Gait belt  OT Visit Diagnosis: Unsteadiness on feet (R26.81);Muscle weakness (generalized) (M62.81);Other symptoms and signs involving cognitive function   Activity Tolerance Patient limited by fatigue   Patient Left in chair;with call bell/phone within reach;with chair alarm set   Nurse Communication Mobility status        Time: [phone] OT Time Calculation (min): 33 min  Charges: OT General Charges $OT Visit: 1 Visit OT Treatments $Self Care/Home Management  : 8-22 mins $Therapeutic Activity: 8-22 mins  Anderson [place], [place] Office: [phone] 04/05/2020   Julien Girt 04/05/2020, 10:17 AM

## 2020-04-05 NOTE — Telephone Encounter (Signed)
Please advise on patient mychart message. Patient is currently admitted to hospital.  Good Morning,   I am seeking help in regards to transporting Andee Poles to [facility]. With her current state of health it is not conducive to be transported via car or Lucianne Lei as she is not able to physically withstand it without medical assistance. I am aware of [facility] that can help with assistance. I would greatly appreciate your help in this matter to get her the after care she needs.

## 2020-04-05 NOTE — TOC Progression Note (Signed)
Transition of Care [facility]) - Progression Note    Patient Details  Name: Amanda Rivers MRN: [id number] Date of Birth: 1945/08/19  Transition of Care [facility]) CM/SW Contact  Purcell Mouton, RN Phone Number: 04/05/2020, 2:53 PM  Clinical Narrative:    Waiting on [facility] 2nd appeal results.    Expected Discharge Plan: (P) Skilled Nursing Facility Barriers to Discharge: (P) Continued Medical Work up  Expected Discharge Plan and Services Expected Discharge Plan: (P) [place]       Living arrangements for the past 2 months: Single Family Home Expected Discharge Date: 03/30/20                                     Social Determinants of Health (SDOH) Interventions    Readmission Risk Interventions No flowsheet data found.

## 2020-04-05 NOTE — Progress Notes (Signed)
Manufacturing [employer] [facility]) Community Based Palliative Care       This patient has been referred to our palliative care services in the community.  [facility] will continue to follow for any discharge planning needs and to coordinate admission onto palliative care.    Thank you for the opportunity to participate in this patient's care.     Domenic Moras, BSN, RN [facility] Liaison   [phone] (24h on call)

## 2020-04-05 NOTE — Progress Notes (Signed)
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 Brought in due to failure to thrive. Patient was found to have pneumonia. Also found to have acute DVT. Hospitalized for further management.  Patient was discharged home but family has appealed for discharge to skilled nursing facility.  We are awaiting decision for the appeal.  Assessment & Plan:   Active Problems:   Pneumonia   Palliative care by specialist   Goals of care, counseling/discussion   General weakness   Community acquired pneumonia of left lower lobe of lung   Shortness of breath  Left upper lobe pneumonia/sepsis POA/possible radiation pneumonitis Patient presented with tachycardia,  Tachypnea,  leukocytosis, extensive infiltrate left upper lobe. Tuberculosis was a consideration.Patient seen by pulmonology who did not think that this was tuberculosis. Isolation precautions were discontinued. Patient was placed on ceftriaxone and azithromycin.   She has completed course of antibiotics. Patient started on steroids by pulmonology due to concern for radiation pneumonitis. Continue dexamethasone with taper over 2 weeks.  AKI/normal anion gap metabolic acidosis/hypokalemia and hypomagnesemia Baseline creatinine 1-1.2. Presented with creatinine of 2.2.  Improved with IV hydration.   Potassium being supplemented daily.  Check labs in 1 week.  Sinus tachycardia Likely due to anxiety as well as rebound tachycardia from being off of Bystolic.   Heart rate now improved.  Continue Bystolic at current dose.     Failure to thrive x3 weeks Patient has had poor oral intake. Apparently has been going downhill since she was started on radiation treatment about a month ago. Abdominal ultrasound was done which showed gallbladder  sludge without any gallstones or hydronephrosis.  Oral intake has improved some.  Unlikely she will fully get back to normal due to anorexia from her cancer.  Continue to encourage oral intake.    Oropharyngeal dysphagia Aspiration precautions.  Seen by speech therapy.  On dysphagia 2 diet with thin liquids.  Acute metabolic encephalopathy >>> Improving Likely due to metabolic derangements and dehydration. Seems to be improved.   Acute bilateral lower extremity DVT Patient had bilateral leg pain and D-dimer more than 20 which prompted duplex of the legwhich revealed bilateral DVT.  Discussed w/ her oncologist Dr Chryl Heck, and also with patient's daughter. Initially treated with IV heparin.  Transitioned to rivaroxaban.  Will likely need lifelong anticoagulation due to cancer.  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 Isidore Moos and Dr. Chryl Heck. Last radiation 03/05/2020 and going down hill since. Per oncology Dr. Chryl Heck- patient refused chemo and radiation, and refused further work-up of pulmonary nodules with bronchoscopy.  Palliative care was consulted here in the hospital.  Would recommend that they follow at SNF as well.  Thrombocytopenia >> Improved Possibly due to consumption.This has resolved.  Anemia of chronic disease Hemoglobin low but stable.No overt bleeding noted.   New onset prediabetes w/ hyperglycemia HbA1c 6.3. Elevated glucose levelscould be due to steroids.   Monitor CBGs once daily.  Abnormal LFTs Abdominal ultrasound did not show any acute findings. LFTs stable.  Check periodically.  Positive hepatitis C antibody,likely false positive HCV quantitative test did not detect any virus.  Goals of care Patient noted to be DNR/DNI after pulmonology discussed patient's condition with patient's family.  Patient remained stable.  Okay for discharge to SNF when bed is available.  DVT prophylaxis:   Xarelto Code Status:  DNR Family Communication: No family at bed side. Disposition Plan:  Status is: Inpatient  Remains inpatient appropriate because:IV treatments appropriate due to intensity of illness or inability to take PO   Dispo:  Patient From: Home  Planned Disposition: [facility]  Expected discharge date: 1-2 days  Medically stable for discharge: No   Awaiting appeal decision to be discharged to SNF      Consultants:   Pulmonology  Procedures:  Antimicrobials:   Anti-infectives (From admission, onward)   Start     Dose/Rate Route Frequency Ordered Stop   03/30/20 0000  amoxicillin-clavulanate (AUGMENTIN) 875-125 MG tablet  Status:  Discontinued        1 tablet Oral Every 12 hours 03/30/20 0906 04/02/20    03/29/20 1000  amoxicillin-clavulanate (AUGMENTIN) 875-125 MG per tablet 1 tablet        1 tablet Oral Every 12 hours 03/28/20 1020 03/31/20 2128   03/29/20 1000  azithromycin (ZITHROMAX) tablet 500 mg        500 mg Oral Daily 03/28/20 1738 03/30/20 0857   03/26/20 1000  cefTRIAXone (ROCEPHIN) 2 g in sodium chloride 0.9 % 100 mL IVPB  Status:  Discontinued        2 g 200 mL/hr over 30 Minutes Intravenous Every 24 hours 03/25/20 1850 03/28/20 1020   03/26/20 1000  azithromycin (ZITHROMAX) 500 mg in sodium chloride 0.9 % 250 mL IVPB  Status:  Discontinued        500 mg 250 mL/hr over 60 Minutes Intravenous Every 24 hours 03/25/20 1850 03/28/20 1738   03/25/20 1615  azithromycin (ZITHROMAX) 500 mg in sodium chloride 0.9 % 250 mL IVPB        500 mg 250 mL/hr over 60 Minutes Intravenous  Once 03/25/20 1609 03/25/20 1941   03/25/20 1400  cefTRIAXone (ROCEPHIN) 1 g in sodium chloride 0.9 % 100 mL IVPB        1 g 200 mL/hr over 30 Minutes Intravenous  Once 03/25/20 1355 03/25/20 1636     Subjective: Patient was seen and examined at bedside.Overnight events noted. she was sitting on the recliner, denies any concerns.   She reports feeling better,  she  is on oxygen via nasal cannula saturating 94%. She has participated in physical therapy.  Objective: Vitals:   04/04/20 2002 04/04/20 2216 04/05/20 0414 04/05/20 1400  BP: (!) 117/92  112/72 118/70  Pulse: (!) 111  (!) 108 (!) 109  Resp: 18   16  Temp: 99.1 F (37.3 C)  99.4 F (37.4 C) 98.7 F (37.1 C)  TempSrc: Oral  Oral Oral  SpO2: 94% 94% 96% 95%  Weight:      Height:        Intake/Output Summary (Last 24 hours) at 04/05/2020 1421 Last data filed at 04/05/2020 1400 Gross per 24 hour  Intake 480 ml  Output --  Net 480 ml   Filed Weights   03/25/20 1330 03/25/20 2155  Weight: 66.2 kg 66.1 kg    Examination:  General exam: Appears calm and comfortable, not in any acute distress. Respiratory system: Clear to auscultation. Respiratory effort normal. Cardiovascular system: S1 & S2 heard, RRR. No JVD, murmurs, rubs, gallops or clicks. No pedal edema. Gastrointestinal system: Abdomen is nondistended, soft and nontender. No organomegaly or masses felt.  Normal bowel sounds heard. Central nervous system: Alert and oriented. No focal neurological deficits. Extremities: Symmetric 5 x 5 power. No edema, no cyanosis,  no clubbing. Skin: No rashes, lesions or ulcers Psychiatry: Judgement and insight appear normal. Mood & affect appropriate.     Data Reviewed: I have personally reviewed following labs and imaging studies  CBC: Recent Labs  Lab 03/30/20 0439 03/31/20 0447 04/03/20 0512 04/05/20 0300  WBC 10.4 9.2 9.6  --   HGB 9.9* 10.1* 9.8* 9.4*  HCT 31.8* 31.4* 31.2* 29.6*  MCV 78.9* 77.0* 77.8*  --   PLT 167 162 134*  --    Basic Metabolic Panel: Recent Labs  Lab 03/30/20 0439 03/31/20 0447 04/01/20 0449 04/02/20 0454 04/03/20 0512 04/03/20 0515 04/04/20 0954  NA 139 138 137 138 139  --  138  K 4.4 3.4* 3.3* 3.4* 3.2*  --  4.0  CL 107 105 101 101 101  --  100  CO2 23 22 22 25 25   --  25  GLUCOSE 92 107* 111* 98 94  --  113*  BUN 17 16 12 15 17   --  18   CREATININE 0.96 0.91 0.79 0.86 0.85  --  0.91  CALCIUM 8.7* 8.7* 8.6* 8.7* 8.3*  --  8.6*  MG 2.2  --   --   --   --  1.7 2.3   GFR: Estimated Creatinine Clearance: 56.6 mL/min (by C-G formula based on SCr of 0.91 mg/dL). Liver Function Tests: No results for input(s): AST, ALT, ALKPHOS, BILITOT, PROT, ALBUMIN in the last 168 hours. No results for input(s): LIPASE, AMYLASE in the last 168 hours. No results for input(s): AMMONIA in the last 168 hours. Coagulation Profile: No results for input(s): INR, PROTIME in the last 168 hours. Cardiac Enzymes: No results for input(s): CKTOTAL, CKMB, CKMBINDEX, TROPONINI in the last 168 hours. BNP (last 3 results) No results for input(s): PROBNP in the last 8760 hours. HbA1C: No results for input(s): HGBA1C in the last 72 hours. CBG: Recent Labs  Lab 04/03/20 1210 04/03/20 1646 04/04/20 0758 04/04/20 1137 04/04/20 1632  GLUCAP 135* 120* 94 123* 162*   Lipid Profile: No results for input(s): CHOL, HDL, LDLCALC, TRIG, CHOLHDL, LDLDIRECT in the last 72 hours. Thyroid Function Tests: No results for input(s): TSH, T4TOTAL, FREET4, T3FREE, THYROIDAB in the last 72 hours. Anemia Panel: No results for input(s): VITAMINB12, FOLATE, FERRITIN, TIBC, IRON, RETICCTPCT in the last 72 hours. Sepsis Labs: No results for input(s): PROCALCITON, LATICACIDVEN in the last 168 hours.  No results found for this or any previous visit (from the past 240 hour(s)).  Radiology Studies: No results found.  Scheduled Meds: . (feeding supplement) PROSource Plus  30 mL Oral TID BM  . dexamethasone  4 mg Oral Daily  . feeding supplement  1 Container Oral BID BM  . feeding supplement  237 mL Oral BID BM  . montelukast  10 mg Oral QHS  . multivitamin with minerals  1 tablet Oral Daily  . nebivolol  10 mg Oral Daily  . pantoprazole  40 mg Oral Daily  . potassium chloride  40 mEq Oral Daily  . primidone  50 mg Oral TID  . Rivaroxaban  15 mg Oral BID WC   Followed  by  . [START ON 04/18/2020] rivaroxaban  20 mg Oral Q supper  . rosuvastatin  20 mg Oral Daily   Continuous Infusions:   LOS: 11 days    Time spent: 25 mins    Shawna Clamp, MD [facility]   If 7PM-7AM, please contact night-coverage

## 2020-04-06 ENCOUNTER — Ambulatory Visit: Payer: Medicare HMO

## 2020-04-06 LAB — BASIC METABOLIC PANEL
Anion gap: 12 (ref 5–15)
BUN: 14 mg/dL (ref 8–23)
CO2: 25 mmol/L (ref 22–32)
Calcium: 8.4 mg/dL — ABNORMAL LOW (ref 8.9–10.3)
Chloride: 101 mmol/L (ref 98–111)
Creatinine, Ser: 0.87 mg/dL (ref 0.44–1.00)
GFR, Estimated: >60 mL/min (ref >60)
Glucose, Bld: 113 mg/dL — ABNORMAL HIGH (ref 70–99)
Potassium: 3.6 mmol/L (ref 3.5–5.1)
Sodium: 138 mmol/L (ref 135–145)

## 2020-04-06 LAB — CBC
HCT: 31.8 % — ABNORMAL LOW (ref 36.0–46.0)
Hemoglobin: 10 g/dL — ABNORMAL LOW (ref 12.0–15.0)
MCH: 24.8 pg — ABNORMAL LOW (ref 26.0–34.0)
MCHC: 31.4 g/dL (ref 30.0–36.0)
MCV: 78.7 fL — ABNORMAL LOW (ref 80.0–100.0)
Platelets: 108 10*3/uL — ABNORMAL LOW (ref 150–400)
RBC: 4.04 MIL/uL (ref 3.87–5.11)
RDW: 16.1 % — ABNORMAL HIGH (ref 11.5–15.5)
WBC: 12.7 10*3/uL — ABNORMAL HIGH (ref 4.0–10.5)
nRBC: 0 % (ref 0.0–0.2)

## 2020-04-06 LAB — MAGNESIUM: Magnesium: 2 mg/dL (ref 1.7–2.4)

## 2020-04-06 LAB — PHOSPHORUS: Phosphorus: 3.2 mg/dL (ref 2.5–4.6)

## 2020-04-06 NOTE — Progress Notes (Signed)
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  Brought in due to failure to thrive.  Patient was found to have pneumonia.  Also found to have acute DVT. Hospitalized for further management.  Patient was discharged home but family has appealed for discharge to skilled nursing facility.  We are awaiting decision for the appeal.  Assessment & Plan:   Active Problems:   Pneumonia   Palliative care by specialist   Goals of care, counseling/discussion   General weakness   Community acquired pneumonia of left lower lobe of lung   Shortness of breath  Left upper lobe pneumonia/sepsis POA/possible radiation pneumonitis Patient presented with tachycardia,  Tachypnea,  leukocytosis, extensive infiltrate left upper lobe. Tuberculosis was a consideration.  Patient seen by pulmonology who did not think that this was tuberculosis.    Isolation precautions were discontinued. Patient was placed on ceftriaxone and azithromycin.    She has completed course of antibiotics. Patient started on steroids by pulmonology due to concern for radiation pneumonitis.   Continue dexamethasone with taper over 2 weeks.   AKI/normal anion gap metabolic acidosis/hypokalemia and hypomagnesemia >> Resolved. Baseline creatinine 1-1.2.  Presented with creatinine of 2.2.   Improved with IV hydration.    Potassium being supplemented daily.  Check labs in 1 week.   Sinus tachycardia Likely due to anxiety as well as rebound tachycardia from being off of Bystolic.   Heart rate now improved.  Continue Bystolic at current dose.      Failure to thrive x3 weeks Patient has had poor oral intake.  Apparently has been going downhill since she was started on radiation treatment about a month ago. Abdominal ultrasound was done which showed  gallbladder sludge without any gallstones or hydronephrosis.   Oral intake has improved some.  Unlikely she will fully get back to normal due to anorexia from her cancer.  Continue to encourage oral intake.     Oropharyngeal dysphagia Aspiration precautions.    Seen by speech therapy.  On dysphagia 2 diet with thin liquids.   Acute metabolic encephalopathy >>> Improved. Likely due to metabolic derangements and dehydration.  Seems to be improved.     Acute bilateral lower extremity DVT Patient had bilateral leg pain and D-dimer more than 20 which prompted duplex of the leg which revealed bilateral DVT.  Discussed w/ her oncologist Dr Chryl Heck, and also with patient's daughter. Initially treated with IV heparin.  Transitioned to rivaroxaban.  Will likely need lifelong anticoagulation due to cancer.   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 Isidore Moos and Dr. Chryl Heck. Last radiation 03/05/2020 and going down hill since.   Per oncology Dr. Chryl Heck- patient refused chemo and radiation, and refused further work-up of pulmonary nodules with bronchoscopy.   Palliative care was consulted here in the hospital.  Would recommend that they follow at SNF as well.    Thrombocytopenia >> Improving Possibly due to consumption.  This has resolved.   Anemia of chronic disease Hemoglobin low but stable.  No overt bleeding noted.     New onset prediabetes w/ hyperglycemia HbA1c 6.3.  Elevated glucose levels could be due to steroids.    Monitor CBGs once daily.   Abnormal LFTs Abdominal ultrasound did not show any acute findings.  LFTs stable.  Check  periodically.   Positive hepatitis C antibody, likely false positive HCV quantitative test did not detect any virus.   Goals of care Patient noted to be DNR/DNI after pulmonology discussed patient's condition with patient's family.   Patient remained stable.  Okay for discharge to SNF when bed is available.    DVT  prophylaxis:  Xarelto Code Status:  DNR Family Communication: Spoke with daughter. Disposition Plan:  Status is: Inpatient  Remains inpatient appropriate because:IV treatments appropriate due to intensity of illness or inability to take PO   Dispo:  Patient From: Home  Planned Disposition: [place] [facility]  Expected discharge date: 1-2 days  Medically stable for discharge: No   Awaiting appeal decision to be discharged to SNF     Daughter wants to transfer this patient to nursing home facility in [place].  In 1 to 2 days.  Consultants:   Pulmonology  Procedures:  Antimicrobials:   Anti-infectives (From admission, onward)    Start     Dose/Rate Route Frequency Ordered Stop   03/30/20 0000  amoxicillin-clavulanate (AUGMENTIN) 875-125 MG tablet  Status:  Discontinued        1 tablet Oral Every 12 hours 03/30/20 0906 04/02/20    03/29/20 1000  amoxicillin-clavulanate (AUGMENTIN) 875-125 MG per tablet 1 tablet        1 tablet Oral Every 12 hours 03/28/20 1020 03/31/20 2128   03/29/20 1000  azithromycin (ZITHROMAX) tablet 500 mg        500 mg Oral Daily 03/28/20 1738 03/30/20 0857   03/26/20 1000  cefTRIAXone (ROCEPHIN) 2 g in sodium chloride 0.9 % 100 mL IVPB  Status:  Discontinued        2 g 200 mL/hr over 30 Minutes Intravenous Every 24 hours 03/25/20 1850 03/28/20 1020   03/26/20 1000  azithromycin (ZITHROMAX) 500 mg in sodium chloride 0.9 % 250 mL IVPB  Status:  Discontinued        500 mg 250 mL/hr over 60 Minutes Intravenous Every 24 hours 03/25/20 1850 03/28/20 1738   03/25/20 1615  azithromycin (ZITHROMAX) 500 mg in sodium chloride 0.9 % 250 mL IVPB        500 mg 250 mL/hr over 60 Minutes Intravenous  Once 03/25/20 1609 03/25/20 1941   03/25/20 1400  cefTRIAXone (ROCEPHIN) 1 g in sodium chloride 0.9 % 100 mL IVPB        1 g 200 mL/hr over 30 Minutes Intravenous  Once 03/25/20 1355 03/25/20 1636      Subjective: Patient was seen and examined at bedside.  Overnight events noted. She was sitting on the recliner, denies any concerns.   She reports feeling better,  she is on oxygen via nasal cannula saturating 94%. She has participated in physical therapy.  Objective: Vitals:   04/05/20 2034 04/05/20 2139 04/06/20 0517 04/06/20 0850  BP: 110/74 (!) 147/97 108/84   Pulse: 100 (!) 110 99   Resp:  20    Temp: 98.6 F (37 C) 99.4 F (37.4 C) (!) 97.4 F (36.3 C)   TempSrc: Oral Oral Axillary   SpO2: 96% 90% 96% 100%  Weight:      Height:        Intake/Output Summary (Last 24 hours) at 04/06/2020 1218 Last data filed at 04/05/2020 1400 Gross per 24 hour  Intake 120 ml  Output --  Net 120 ml   Filed Weights   03/25/20 1330 03/25/20 2155  Weight: 66.2 kg 66.1 kg    Examination:  General exam:  Appears calm and comfortable, not in any acute distress. Respiratory system: Clear to auscultation. Respiratory effort normal. Cardiovascular system: S1 & S2 heard, RRR. No JVD, murmurs, rubs, gallops or clicks. No pedal edema. Gastrointestinal system: Abdomen is nondistended, soft and nontender. No organomegaly or masses felt.  Normal bowel sounds heard. Central nervous system: Alert and oriented. No focal neurological deficits. Extremities: Symmetric 5 x 5 power. No edema, no cyanosis, no clubbing. Skin: No rashes, lesions or ulcers Psychiatry: Judgement and insight appear normal. Mood & affect appropriate.     Data Reviewed: I have personally reviewed following labs and imaging studies  CBC: Recent Labs  Lab 03/31/20 0447 04/03/20 0512 04/05/20 0300 04/06/20 0421  WBC 9.2 9.6  --  12.7*  HGB 10.1* 9.8* 9.4* 10.0*  HCT 31.4* 31.2* 29.6* 31.8*  MCV 77.0* 77.8*  --  78.7*  PLT 162 134*  --  364*   Basic Metabolic Panel: Recent Labs  Lab 04/01/20 0449 04/02/20 0454 04/03/20 0512 04/03/20 0515 04/04/20 0954 04/06/20 0421  NA 137 138 139  --  138 138  K 3.3* 3.4* 3.2*  --  4.0 3.6  CL 101 101 101  --  100 101  CO2 22 25 25    --  25 25  GLUCOSE 111* 98 94  --  113* 113*  BUN 12 15 17   --  18 14  CREATININE 0.79 0.86 0.85  --  0.91 0.87  CALCIUM 8.6* 8.7* 8.3*  --  8.6* 8.4*  MG  --   --   --  1.7 2.3 2.0  PHOS  --   --   --   --   --  3.2   GFR: Estimated Creatinine Clearance: 59.2 mL/min (by C-G formula based on SCr of 0.87 mg/dL). Liver Function Tests: No results for input(s): AST, ALT, ALKPHOS, BILITOT, PROT, ALBUMIN in the last 168 hours. No results for input(s): LIPASE, AMYLASE in the last 168 hours. No results for input(s): AMMONIA in the last 168 hours. Coagulation Profile: No results for input(s): INR, PROTIME in the last 168 hours. Cardiac Enzymes: No results for input(s): CKTOTAL, CKMB, CKMBINDEX, TROPONINI in the last 168 hours. BNP (last 3 results) No results for input(s): PROBNP in the last 8760 hours. HbA1C: No results for input(s): HGBA1C in the last 72 hours. CBG: Recent Labs  Lab 04/03/20 1210 04/03/20 1646 04/04/20 0758 04/04/20 1137 04/04/20 1632  GLUCAP 135* 120* 94 123* 162*   Lipid Profile: No results for input(s): CHOL, HDL, LDLCALC, TRIG, CHOLHDL, LDLDIRECT in the last 72 hours. Thyroid Function Tests: No results for input(s): TSH, T4TOTAL, FREET4, T3FREE, THYROIDAB in the last 72 hours. Anemia Panel: No results for input(s): VITAMINB12, FOLATE, FERRITIN, TIBC, IRON, RETICCTPCT in the last 72 hours. Sepsis Labs: No results for input(s): PROCALCITON, LATICACIDVEN in the last 168 hours.  No results found for this or any previous visit (from the past 240 hour(s)).  Radiology Studies: No results found.  Scheduled Meds: . (feeding supplement) PROSource Plus  30 mL Oral TID BM  . dexamethasone  4 mg Oral Daily  . feeding supplement  1 Container Oral BID BM  . feeding supplement  237 mL Oral BID BM  . montelukast  10 mg Oral QHS  . multivitamin with minerals  1 tablet Oral Daily  . nebivolol  10 mg Oral Daily  . pantoprazole  40 mg Oral Daily  . potassium chloride   40 mEq Oral Daily  . primidone  50 mg Oral  TID  . Rivaroxaban  15 mg Oral BID WC   Followed by  . [START ON 04/18/2020] rivaroxaban  20 mg Oral Q supper  . rosuvastatin  20 mg Oral Daily   Continuous Infusions:    LOS: 12 days    Time spent: 25 mins    Shawna Clamp, MD [facility]   If 7PM-7AM, please contact night-coverage

## 2020-04-06 NOTE — TOC Progression Note (Addendum)
Transition of Care [facility]) - Progression Note    Patient Details  Name: Tallula Grindle MRN: [id number] Date of Birth: 1945/08/12  Transition of [facility]) CM/SW Contact  Purcell Mouton, RN Phone Number: 04/06/2020, 3:25 PM  Clinical Narrative:     Continue to wait on [facility].  [name] is aware that pt is discharged and stable for discharge. Will update clinicals to facility once daughter is ready for pt to go.   Expected Discharge Plan: (P) Skilled Nursing Facility Barriers to Discharge: (P) Continued Medical Work up  Expected Discharge Plan and Services Expected Discharge Plan: (P) [place]       Living arrangements for the past 2 months: Single Family Home Expected Discharge Date: 04/06/20                                     Social Determinants of Health (SDOH) Interventions    Readmission Risk Interventions No flowsheet data found.

## 2020-04-06 NOTE — Progress Notes (Signed)
Daughter called wanting an update.  I spoke with Amanda Rivers (daughter) who said the family had arranged for patient to go to a facility in [place] but were trying to arrange "medical transport".  Patient is on room air, able to get up with 1 assist to the chair so I asked why the patient required medical transport.  Spoke with daughter and granddaughter over the phone clarifying that the patient does not require any kind of medical transport.  This was also clarified with the physician.  I informed the daughter they would be able to pull their private vehicle to main entrance of [facility], staff here could bring patient down by wheelchair and assist her in the car for the 2 hour drive to the facility.  The facility staff in [place] should be able to assist patient out of the car into the facility as well.  Daughter gave name of facility as [facility] [address] [place] [address].  Phone is [phone].    Daughter also expressed concern about a discharge plan.  I reassured daughter that she would be discharged with discharge paperwork with all her medications that she is to take and that case management could send [facility] information regarding the patient.  Dr. Dwyane Dee notified of all the above, did try to contact daughter with no answer.

## 2020-04-07 ENCOUNTER — Ambulatory Visit: Payer: Medicare HMO

## 2020-04-07 LAB — CBC
HCT: 32.1 % — ABNORMAL LOW (ref 36.0–46.0)
Hemoglobin: 9.8 g/dL — ABNORMAL LOW (ref 12.0–15.0)
MCH: 24.6 pg — ABNORMAL LOW (ref 26.0–34.0)
MCHC: 30.5 g/dL (ref 30.0–36.0)
MCV: 80.5 fL (ref 80.0–100.0)
Platelets: 110 10*3/uL — ABNORMAL LOW (ref 150–400)
RBC: 3.99 MIL/uL (ref 3.87–5.11)
RDW: 16.4 % — ABNORMAL HIGH (ref 11.5–15.5)
WBC: 12.3 10*3/uL — ABNORMAL HIGH (ref 4.0–10.5)
nRBC: 0 % (ref 0.0–0.2)

## 2020-04-07 MED ORDER — POTASSIUM CHLORIDE 20 MEQ PO PACK: 20.0000 meq | PACK | Freq: Every day | ORAL | 0 refills | Status: DC

## 2020-04-07 MED ORDER — RIVAROXABAN 20 MG PO TABS: 20.0000 mg | ORAL_TABLET | Freq: Every day | ORAL | 0 refills | Status: DC

## 2020-04-07 MED ORDER — ENSURE ENLIVE PO LIQD: 237.0000 mL | Freq: Two times a day (BID) | ORAL | 0 refills | Status: AC

## 2020-04-07 MED ORDER — PROSOURCE PLUS PO LIQD: 30.0000 mL | Freq: Two times a day (BID) | ORAL | 0 refills | Status: AC

## 2020-04-07 MED ORDER — PROSOURCE PLUS PO LIQD: 30.0000 mL | Freq: Three times a day (TID) | ORAL | 0 refills | Status: AC

## 2020-04-07 MED ORDER — ADULT MULTIVITAMIN W/MINERALS CH: 1.0000 | ORAL_TABLET | Freq: Every day | ORAL | 0 refills | Status: AC

## 2020-04-07 MED ORDER — RIVAROXABAN 15 MG PO TABS: 15.0000 mg | ORAL_TABLET | Freq: Two times a day (BID) | ORAL | 0 refills | Status: DC

## 2020-04-07 MED ORDER — DEXAMETHASONE 2 MG PO TABS: ORAL_TABLET | ORAL | 0 refills | Status: DC

## 2020-04-07 NOTE — TOC Progression Note (Signed)
Transition of Care [facility]) - Progression Note    Patient Details  Name: Akaisha Truman MRN: [id number] Date of Birth: 11-16-1945  Transition of Care [facility]) CM/SW Contact  Purcell Mouton, RN Phone Number: 04/07/2020, 12:51 PM  Clinical Narrative:    [facility] rep returned a call to explain that she spoke with pt's daughter about her concerns and transportation. When asked about Oxygen pt was on room air. Pt is safe to discharge by car to facility and stable for discharge per MD notes.     Expected Discharge Plan: (P) Skilled Nursing Facility Barriers to Discharge: (P) Continued Medical Work up  Expected Discharge Plan and Services Expected Discharge Plan: (P) [place]       Living arrangements for the past 2 months: Single Family Home Expected Discharge Date: 04/07/20                                     Social Determinants of Health (SDOH) Interventions    Readmission Risk Interventions No flowsheet data found.

## 2020-04-07 NOTE — TOC Progression Note (Signed)
Transition of Care [facility]) - Progression Note    Patient Details  Name: Amanda Rivers MRN: [id number] Date of Birth: 06-17-1945  Transition of Care [facility]) CM/SW Contact  Purcell Mouton, RN Phone Number: 04/07/2020, 10:04 AM  Clinical Narrative:    Spoke with pt's daughter Amanda Rivers this AM concerning discharge plans. Amanda Rivers states that she is arranging transportation for pt and will return this call.    Expected Discharge Plan: (P) Skilled Nursing Facility Barriers to Discharge: (P) Continued Medical Work up  Expected Discharge Plan and Services Expected Discharge Plan: (P) [place]       Living arrangements for the past 2 months: Single Family Home Expected Discharge Date: 04/07/20                                     Social Determinants of Health (SDOH) Interventions    Readmission Risk Interventions No flowsheet data found.

## 2020-04-07 NOTE — TOC Progression Note (Signed)
Transition of Care [facility]) - Progression Note    Patient Details  Name: Amanda Rivers MRN: [id number] Date of Birth: Jun 05, 1945  Transition of Care [facility]) CM/SW Contact  Purcell Mouton, RN Phone Number: 04/07/2020, 2:26 PM  Clinical Narrative:    Clinical fax to [facility]. Admission Coordinator from [place] called, states that she has not heard from pt's daughter today. A call was made to daughter, with no answer. Left VM for daughter to return CM call.    Expected Discharge Plan: (P) Skilled Nursing Facility Barriers to Discharge: (P) Continued Medical Work up  Expected Discharge Plan and Services Expected Discharge Plan: (P) [place]       Living arrangements for the past 2 months: Single Family Home Expected Discharge Date: 04/07/20                                     Social Determinants of Health (SDOH) Interventions    Readmission Risk Interventions No flowsheet data found.

## 2020-04-07 NOTE — Plan of Care (Signed)
  Problem: Education: Goal: Knowledge of General Education information will improve Description: Including pain rating scale, medication(s)/side effects and non-pharmacologic comfort measures Outcome: Progressing   Problem: Education: Goal: Knowledge of General Education information will improve Description: Including pain rating scale, medication(s)/side effects and non-pharmacologic comfort measures Outcome: Progressing   Problem: Education: Goal: Knowledge of General Education information will improve Description: Including pain rating scale, medication(s)/side effects and non-pharmacologic comfort measures Outcome: Progressing   Problem: Education: Goal: Knowledge of General Education information will improve Description: Including pain rating scale, medication(s)/side effects and non-pharmacologic comfort measures Outcome: Progressing   Problem: Education: Goal: Knowledge of General Education information will improve Description: Including pain rating scale, medication(s)/side effects and non-pharmacologic comfort measures Outcome: Progressing   Problem: Education: Goal: Knowledge of General Education information will improve Description: Including pain rating scale, medication(s)/side effects and non-pharmacologic comfort measures Outcome: Progressing

## 2020-04-07 NOTE — TOC Progression Note (Addendum)
Transition of Care [facility]) - Progression Note    Patient Details  Name: Seynabou Fults MRN: [id number] Date of Birth: [birth year]  Transition of Care [facility]) CM/SW Contact  Purcell Mouton, RN Phone Number: 04/07/2020, 3:21 PM  Clinical Narrative:    [phone] [name] [name] returned my call. Daughter stated that she called [facility], spoke with Lavella Lemons with concerns of her mother's care and transportation to [place]. This CM asked [name] what could I do to help her and what was her plans for discharge for her mother.  Went over transportation again with [name], however she stated that Tanya from [place] was helping setup transportation. [name] states that pt will need medical transportation by wheel chair because she does not feel comfortable transporting her mother. [name] stated that she called [facility] and Dietitian), the person she spoke with was talking about other transportation. This CM spoke with Clarita Crane who went over all the transportation:[facility] (only from acute to acute) and [facility](over $2000.00). Clarita Crane states she would go over with daughter transportation.   Also explained to [name] that her mother was discharged again and she could appeal. [name] states she will get her mother when transportation was setup.   Escalated,  [name]'s daughter's concerns of her mother's pt care to Surveyor, quantity of 4th Floor.            Expected Discharge Plan: (P) Skilled Nursing Facility Barriers to Discharge: (P) Continued Medical Work up  Expected Discharge Plan and Services Expected Discharge Plan: (P) [place]       Living arrangements for the past 2 months: Single Family Home Expected Discharge Date: 04/07/20                                     Social Determinants of Health (SDOH) Interventions    Readmission Risk Interventions No flowsheet data found.

## 2020-04-07 NOTE — Progress Notes (Signed)
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  Brought in due to failure to thrive.  Patient was found to have pneumonia and acute DVT   OT comments  Patient needing encouragement for OOB activity. Mod A  For bed mobility due to UE/LE stiffness as, pt found in flexed position in bed. Pt min A for safety and cues for hand placement to transfer to [facility], mod A for clothing management and total A for perianal care after BM. Pt transfer to recliner with min A and no carry over to reach back when sitting, set up for oral care however patient not initiating. When asked what's wrong pt states "I have to pee." Transferred back to [facility] with min A and rolling walker, mod A for clothing management and total A for peri care then back to recliner again with no effort to reach back for chair and poor eccentric control in sitting. Pt needing min A and max cues for brushing her teeth. Pt minimally conversive, does ask if she can go home today, poor insight to deficits.   Follow Up Recommendations  SNF    Equipment Recommendations  None recommended by OT       Precautions / Restrictions Precautions Precautions: Fall Precaution Comments: monitor O2 and HR       Mobility Bed Mobility Overal bed mobility: Needs Assistance Bed Mobility: Supine to Sit     Supine to sit: Mod assist;HOB elevated     General bed mobility comments: pt very stiff in flexed position upon arrival needing assist to manage LEs and elevate trunk to sitting  Transfers Overall transfer level: Needs assistance Equipment used: Rolling walker (2 wheeled) Transfers: Sit to/from [facility] Sit to Stand: Min assist Stand pivot transfers: Min assist       General transfer comment: please see toilet transfer in ADL section,  poor carry over of hand placement with poor eccentric control into chair x2    Balance Overall balance assessment: Needs assistance Sitting-balance support: Feet supported Sitting balance-Leahy Scale: Fair     Standing balance support: Single extremity supported Standing balance-Leahy Scale: Poor Standing balance comment: reliant on external support                           ADL either performed or assessed with clinical judgement   ADL Overall ADL's : Needs assistance/impaired     Grooming: Oral care;Minimal assistance;Sitting Grooming Details (indicate cue type and reason): needs increased time to initiate task, mod cues for sequencing and min A for thoroughness/minimize mess as patient is very tremulous                 Toilet Transfer: Minimal assistance;Cueing for safety;Stand-pivot;Cueing for sequencing;BSC;RW Toilet Transfer Details (indicate cue type and reason): transferred twice to [facility] once for BM, pt later stating needing to void. poor carry over with hand placement and needing min A to power up to standing from low commode Toileting- Clothing Manipulation and Hygiene: Maximal assistance;Sit to/from stand Toileting - Clothing Manipulation Details (indicate cue type and reason): total A for perianal care after BM and voiding. patient does assist with clothing management needing mod A to complete safely     Functional mobility during ADLs: Minimal assistance;Cueing for safety;Cueing for sequencing;Rolling walker General ADL Comments: pt needing  encouragement for OOB activity, report fatigue and shortness of breath after toileting on RA sats at 91% however donned 2L once in recliner.               Cognition Arousal/Alertness: Awake/alert Behavior During Therapy: WFL for tasks assessed/performed Overall Cognitive Status: No family/caregiver present to determine baseline cognitive functioning                                 General  Comments: patient asking if she can go home today after being told D/C plan is rehab, limited insight to deficits as patient becomes fatigued with ADL activity        Exercises Exercises: General Upper Extremity;Other exercises General Exercises - Upper Extremity Shoulder Flexion: AAROM;Both;10 reps;Seated Shoulder ABduction: AAROM;Both;10 reps;Seated Other Exercises Other Exercises: shoulder shrugs x10           Pertinent Vitals/ Pain       Pain Assessment: Faces Faces Pain Scale: Hurts a little bit Pain Location: B shoulders with ROM Pain Descriptors / Indicators: Aching;Grimacing Pain Intervention(s): Monitored during session         Frequency  Min 2X/week        Progress Toward Goals  OT Goals(current goals can now be found in the care plan section)  Progress towards OT goals: Progressing toward goals  Acute Rehab OT Goals Patient Stated Goal: to get stronger OT Goal Formulation: With patient Time For Goal Achievement: 04/12/20 Potential to Achieve Goals: Good ADL Goals Pt Will Perform Lower Body Dressing: with min assist;with adaptive equipment;sit to/from stand;sitting/lateral leans Pt Will Transfer to Toilet: with supervision;ambulating Pt Will Perform Toileting - Clothing Manipulation and hygiene: with min assist;sit to/from stand;sitting/lateral leans  Plan Discharge plan remains appropriate       AM-PAC OT "6 Clicks" Daily Activity     Outcome Measure   Help from another person eating meals?: A Little Help from another person taking care of personal grooming?: A Little Help from another person toileting, which includes using toliet, bedpan, or urinal?: A Lot Help from another person bathing (including washing, rinsing, drying)?: A Lot Help from another person to put on and taking off regular upper body clothing?: A Lot Help from another person to put on and taking off regular lower body clothing?: A Lot 6 Click Score: 14    End of Session  Equipment Utilized During Treatment: Rolling walker;Oxygen  OT Visit Diagnosis: Unsteadiness on feet (R26.81);Muscle weakness (generalized) (M62.81);Other symptoms and signs involving cognitive function   Activity Tolerance Patient tolerated treatment well   Patient Left in chair;with call bell/phone within reach;with chair alarm set   Nurse Communication Mobility status;Other (comment) (BM)        Time: 6283-1517 OT Time Calculation (min): 39 min  Charges: OT General Charges $OT Visit: 1 Visit OT Treatments $Self Care/Home Management : 23-37 mins $Therapeutic Exercise: 8-22 mins  Delbert Phenix OT OT pager: [place] 04/07/2020, 1:14 PM

## 2020-04-07 NOTE — Discharge Summary (Addendum)
Discharge Summary  Amanda Rivers YQM:[id number] DOB: [birth year]  PCP: Amanda Lei, MD  Admit date: 03/25/2020 Discharge date: 04/07/2020  Time spent: 35 minutes  Recommendations for Outpatient Follow-up:  1. Follow-up with your PCP in 1 to 2 weeks. 2. Follow-up with medical oncology and radiation oncology in 1 to 2 weeks. 3. Follow-up with pulmonary in 1 to 2 weeks. 4. Follow-up with palliative care medicine in 1 to 2 weeks. 5. Continue PT OT with assistance and fall precautions. 6. Take your medications as prescribed. 7. Repeat CMP in 1 week.    Discharge Diagnoses:  Active Hospital Problems   Diagnosis Date Noted  . Community acquired pneumonia of left lower lobe of lung   . Shortness of breath   . Palliative care by specialist   . Goals of care, counseling/discussion   . General weakness   . Pneumonia 03/25/2020    Resolved Hospital Problems  No resolved problems to display.    Discharge Condition: Stable.  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 Brought in due to failure to thrive.  Patient was found to have left upper lobe pneumonia. Also found to have acute Bilateral lower extremity DVT. Hospitalized for further management.  She completed her course of antibiotics.  Due to concern for radiation pneumonitis, she was started on dexamethasone with taper over 2 weeks.  She is currently on Xarelto for acute bilateral lower extremity DVT.   04/07/20: Seen and examined at bedside. There were no acute events overnight. She has no new complaints. Vital signs and labs reviewed and are stable.  The writer had an extensive conversation with Amanda Rivers's daughter Amanda Rivers  via phone.  Answered questions to the best of my ability.  Patient is medically cleared for discharge to SNF.  She will need to follow up with her providers post hospitalization as recommended above.  She will need to repeat her CMP in 1 week as recommended above.   Hospital Course:  Active Problems:   Pneumonia   Palliative care by specialist   Goals of care, counseling/discussion   General weakness   Community acquired pneumonia of left lower lobe of lung   Shortness of breath  Treated left upper lobe pneumonia/sepsis, resolved, POA. Concern for radiation pneumonitis, on tapering steroids. Patient presented with tachycardia,  Tachypnea,  leukocytosis, extensive infiltrate left upper lobe. Tuberculosis was a consideration.Patient seen by pulmonology who did not think that this was tuberculosis. Isolation precautions were discontinued. Patient was placed on ceftriaxone and azithromycin, completed course. Patient started on steroids by pulmonology due to concern for radiation pneumonitis. Continue dexamethasone with taper over 2 weeks. Follow-up with pulmonary in 1 to 2 weeks.  Resolved AKI/normal anion gap metabolic acidosis/hypokalemia and hypomagnesemia. Repeat labs in 1 week.  Resolved sinus tachycardia Continue Bystolic.  Resolved acute hypoxemia 2/2 to LUL PNA, poa Currently on Room air with O2 saturation 98%  Failure to thrive x3 weeks suspect related to her malignancy. Abdominal ultrasound was done which showed gallbladder sludge. No gallstones or findings of acute cholecystitis. No biliary dilatation.  Oral intake has improved. Continue to encourage increase oral intake as tolerated. Continue oral  supplement as tolerated.  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 Continue aspiration precautions. Seen by speech therapy.  Currently on dysphagia 2 diet with thin liquids.  Improved acute metabolic  encephalopathy Likely due to metabolic derangements and dehydration.  Calm, no behavioral issues reported.  Acute bilateral lower extremity DVT Patient had bilateral leg pain and D-dimer more than 20 which prompted duplex of the legs revealing acute bilateral lower extremities DVT.  Discussed w/ her oncologist Amanda Rivers. Initially treated with IV heparin. Transitioned to rivaroxaban. Will likely need lifelong anticoagulation due to cancer.  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 Chryl Rivers. Last radiation 03/05/2020.  Per oncology Amanda. Chryl Rivers- patient refused chemo and radiation, and refused further work-up of pulmonary nodules with bronchoscopy.  Palliative care was consulted here in the hospital.  Would recommend that they follow at SNF as well.  Improving acute thrombocytopenia Platelet count 110 K from 108 K No evidence of bleeding. Follow-up with hematology oncology.  Anemia of chronic disease Hemoglobin 9.8 from 10.0. MCV 80.5. No overt bleeding. Follow-up with hematology oncology.  Leukocytosis, likely reactive in the setting of steroid use. WBC 12.3K from 12.7K. No evidence of active infective process. Afebrile and nonseptic appearing.  New onset prediabetes w/ hyperglycemia HbA1c 6.3.  Likely exacerbated by steroid use. Steroid is being tapered off.  Abnormal LFTs Abdominal ultrasound did not show an explanation for elevated liver chemistries. Avoid hepatotoxic agents Follow-up outpatient with PCP. Repeat CMP in 1 week.  Positive hepatitis C antibody,likely false positive (03/25/20) HCV quantitative test did not detect any virus.  Goals of care Patient noted to be DNR/DNI after pulmonology discussed patient's condition with patient's family.     DVT prophylaxis:  Xarelto Code Status:  DNR   Procedures:  None.  Consultations:  Pulmonology.  Discharge Exam: BP (!) 99/58  (BP Location: Right Arm)   Pulse 87   Temp 99.1 F (37.3 C) (Oral)   Resp 20   Ht 5' 11.5" (1.816 m)   Wt 66.1 kg   SpO2 98%   BMI 20.04 kg/m  . General: 75 y.o. year-old female well developed well nourished in no acute distress.  Alert and pleasant. . Cardiovascular: Regular rate and rhythm with no rubs or gallops.  No thyromegaly or JVD noted.   Marland Kitchen Respiratory: Clear to auscultation with no wheezes or rales. . Abdomen: Soft nontender nondistended with normal bowel sounds x4 quadrants. . Musculoskeletal: Trace lower extremity edema bilaterally. Marland Kitchen Psychiatry: Mood is appropriate for condition and setting  Discharge Instructions You were cared for by a hospitalist during your hospital stay. If you have any questions about your discharge medications or the care you received while you were in the hospital after you are discharged, you can call the unit and asked to speak with the hospitalist on call if the hospitalist that took care of you is not available. Once you are discharged, your primary care physician will handle any further medical issues. Please note that NO REFILLS for any discharge medications will be authorized once you are discharged, as it is imperative that you return to your primary care physician (or establish a relationship with a primary care physician if you do not have one) for your aftercare needs so that they can reassess your need for medications and monitor your lab values.  Discharge Instructions    Call MD for:  difficulty breathing, headache or visual disturbances  Complete by: As directed    Call MD for:  extreme fatigue   Complete by: As directed    Call MD for:  persistant dizziness or light-headedness   Complete by: As directed    Call MD for:  persistant nausea and vomiting   Complete by: As directed    Call MD for:  severe uncontrolled pain   Complete by: As directed    Call MD for:  temperature >100.4   Complete by: As directed    Discharge  instructions   Complete by: As directed    Please review instructions on the discharge summary.  You were cared for by a hospitalist during your hospital stay. If you have any questions about your discharge medications or the care you received while you were in the hospital after you are discharged, you can call the unit and asked to speak with the hospitalist on call if the hospitalist that took care of you is not available. Once you are discharged, your primary care physician will handle any further medical issues. Please note that NO REFILLS for any discharge medications will be authorized once you are discharged, as it is imperative that you return to your primary care physician (or establish a relationship with a primary care physician if you do not have one) for your aftercare needs so that they can reassess your need for medications and monitor your lab values. If you do not have a primary care physician, you can call [phone] for a physician referral.   Increase activity slowly   Complete by: As directed      Allergies as of 04/07/2020   No Known Allergies     Medication List    STOP taking these medications   amLODipine-olmesartan 10-40 MG tablet Commonly known as: AZOR   spironolactone 25 MG tablet Commonly known as: ALDACTONE     TAKE these medications   (feeding supplement) PROSource Plus liquid Take 30 mLs by mouth 3 (three) times daily between meals for 7 days.   feeding supplement Liqd Take 237 mLs by mouth 2 (two) times daily between meals for 7 days.   (feeding supplement) PROSource Plus liquid Take 30 mLs by mouth 2 (two) times daily between meals for 7 days.   dexamethasone 2 MG tablet Commonly known as: DECADRON Take 4mg  daily for 1 week and then 2mg  daily for 1 week and then STOP.   dexlansoprazole 60 MG capsule Commonly known as: DEXILANT Take 60 mg by mouth daily.   HYDROcodone-acetaminophen 5-325 MG tablet Commonly known as: NORCO/VICODIN Take 1  tablet by mouth every 6 (six) hours as needed for severe pain.   montelukast 10 MG tablet Commonly known as: SINGULAIR Take 10 mg by mouth at bedtime.   multivitamin with minerals Tabs tablet Take 1 tablet by mouth daily.   nebivolol 10 MG tablet Commonly known as: BYSTOLIC Take 10 mg by mouth daily.   potassium chloride 20 MEQ packet Commonly known as: KLOR-CON Take 20 mEq by mouth daily for 7 days.   primidone 50 MG tablet Commonly known as: MYSOLINE Take 50 mg by mouth 3 (three) times daily.   Rivaroxaban 15 MG Tabs tablet Commonly known as: XARELTO Take 1 tablet (15 mg total) by mouth 2 (two) times daily with a meal for 21 days.   rivaroxaban 20 MG Tabs tablet Commonly known as: XARELTO Take 1 tablet (20 mg total) by mouth daily with supper. Start after completing the twice daily dosing for 3 weeks Start taking  on: April 21, 2020   rosuvastatin 20 MG tablet Commonly known as: CRESTOR Take 20 mg by mouth daily.      No Known Allergies  Contact information for follow-up providers    Amanda Lei, MD. Schedule an appointment as soon as possible for a visit in 1 week(s).   Specialty: Family Medicine Contact information: [address] [place] [address] [phone]            Contact information for after-discharge care    Destination    [facility] Preferred SNF .   Service: Skilled Nursing Contact information: [address] [place] [phone]                   The results of significant diagnostics from this hospitalization (including imaging, microbiology, ancillary and laboratory) are listed below for reference.    Significant Diagnostic Studies: DG Lumbar Spine Complete  Result Date: 03/25/2020 CLINICAL DATA:  Fall. EXAM: LUMBAR SPINE - COMPLETE 4+ VIEW COMPARISON:  None. FINDINGS: Normal alignment. Negative for fracture. Mild disc degeneration. Facet degeneration L4-5 and L5-S1  Atherosclerotic calcification aorta and iliac arteries without aneurysm. IMPRESSION: Negative for fracture Electronically Signed   By: Franchot Gallo M.D.   On: 03/25/2020 15:27   US Abdomen Complete  Result Date: 03/25/2020 CLINICAL DATA:  Acute kidney injury and elevated LFTs. EXAM: ABDOMEN ULTRASOUND COMPLETE COMPARISON:  PET CT 02/04/2020 FINDINGS: Gallbladder: Physiologically distended. Layering sludge. No gallstones or wall thickening visualized. No sonographic Murphy sign noted by sonographer. Common bile duct: Diameter: 3 mm, normal. Liver: No focal lesion identified. Within normal limits in parenchymal echogenicity. Portal vein is patent on color Doppler imaging with normal direction of blood flow towards the liver. IVC: No abnormality visualized. Pancreas: Visualized portion unremarkable. Spleen: Size and appearance within normal limits. Right Kidney: Length: 10.0 cm. Echogenicity within normal limits. No hydronephrosis. 1.5 cm cyst in the lower kidney. No solid mass or visualized renal calculi. Left Kidney: Length: 8.3 cm. Slight increased renal parenchymal echogenicity. No hydronephrosis. 1.3 cm cyst in the medial kidney. No solid lesion or visualized renal calculi. Abdominal aorta: No aneurysm visualized.  Atheromatous plaque. Other findings: No abdominal ascites. There is a left pleural effusion. IMPRESSION: 1. Gallbladder sludge. No gallstones or findings of acute cholecystitis. 2. No explanation for elevated LFTs.  No biliary dilatation. 3. No hydronephrosis or obstructive uropathy. Mild left renal atrophy and increased echogenicity. Incidental small bilateral renal cysts. 4. Left pleural effusion. Electronically Signed   By: Keith Rake M.D.   On: 03/25/2020 20:02   DG Chest Port 1 View  Result Date: 03/27/2020 CLINICAL DATA:  Chest pain and shortness of breath, diminished breath sounds by report. EXAM: PORTABLE CHEST 1 VIEW COMPARISON:  Multiple prior studies dating back to 2010, most  recent March 25, 2020 FINDINGS: Accounting for differences in technique no interval change in the appearance of interstitial and airspace disease in the LEFT upper chest compared to the recent comparison study. Signs of emphysema in the background as before. Pleural and parenchymal scarring at the RIGHT lung base unchanged. Cardiomediastinal contours and hilar structures are stable with partially obscured LEFT hilar structures due to interstitial and airspace opacities. On limited assessment no acute skeletal process. IMPRESSION: No interval change in the appearance of the chest compared to the recent comparison study. Signs of emphysema with basilar scarring RIGHT greater than LEFT. Findings may represent infection as discussed previously. Note that this patient also has a  history of neoplasm. Lymphangitic carcinomatosis is another differential consideration. Would also correlate with whether there has been radiation therapy to this area that could also be a cause of pneumonitis. CT of the chest may be helpful for further evaluation. Electronically Signed   By: Zetta Bills M.D.   On: 03/27/2020 15:24   DG Chest Port 1 View  Result Date: 03/25/2020 CLINICAL DATA:  Fall EXAM: PORTABLE CHEST 1 VIEW COMPARISON:  06/10/2008 FINDINGS: Extensive airspace disease left upper lobe consistent with pneumonia. Underlying emphysema. Blunting right costophrenic angle unchanged compatible with effusion. Right lung otherwise clear Heart size and vascularity normal. Atherosclerotic calcification aortic arch. IMPRESSION: Extensive infiltrate left upper lobe consistent with pneumonia. Possible TB. Small right pleural effusion. Electronically Signed   By: Franchot Gallo M.D.   On: 03/25/2020 15:29   DG Hip Unilat With Pelvis 2-3 Views Left  Result Date: 03/25/2020 CLINICAL DATA:  Fall.  History of lung cancer EXAM: DG HIP (WITH OR WITHOUT PELVIS) 2-3V LEFT COMPARISON:  None. FINDINGS: There is no evidence of hip fracture or  dislocation. There is no evidence of arthropathy or other focal bone abnormality. IMPRESSION: Negative. Electronically Signed   By: Franchot Gallo M.D.   On: 03/25/2020 15:26   DG Hip Unilat With Pelvis 2-3 Views Right  Result Date: 03/25/2020 CLINICAL DATA:  Fall.  Hip pain.  Lung cancer history EXAM: DG HIP (WITH OR WITHOUT PELVIS) 2-3V RIGHT COMPARISON:  None. FINDINGS: There is no evidence of hip fracture or dislocation. There is no evidence of arthropathy or other focal bone abnormality. IMPRESSION: Negative. Electronically Signed   By: Franchot Gallo M.D.   On: 03/25/2020 15:26   VAS [place] LOWER EXTREMITY VENOUS (DVT)  Result Date: 03/26/2020  Lower Venous DVT Study Indications: Pain.  Risk Factors: None identified. Comparison Study: No prior studies. Performing Technologist: Oliver Hum RVT  Examination Guidelines: A complete evaluation includes B-mode imaging, spectral Doppler, color Doppler, and power Doppler as needed of all accessible portions of each vessel. Bilateral testing is considered an integral part of a complete examination. Limited examinations for reoccurring indications may be performed as noted. The reflux portion of the exam is performed with the patient in reverse Trendelenburg.  +---------+---------------+---------+-----------+----------+--------------+ RIGHT    CompressibilityPhasicitySpontaneityPropertiesThrombus Aging +---------+---------------+---------+-----------+----------+--------------+ CFV      None           No       No                   Acute          +---------+---------------+---------+-----------+----------+--------------+ SFJ      Full                                                        +---------+---------------+---------+-----------+----------+--------------+ FV Prox  None           No       No                   Acute          +---------+---------------+---------+-----------+----------+--------------+ FV Mid   None           No        No                   Acute          +---------+---------------+---------+-----------+----------+--------------+  FV DistalNone           No       No                   Acute          +---------+---------------+---------+-----------+----------+--------------+ PFV      None           No       No                   Acute          +---------+---------------+---------+-----------+----------+--------------+ POP      None           No       No                   Acute          +---------+---------------+---------+-----------+----------+--------------+ PTV      None                                         Acute          +---------+---------------+---------+-----------+----------+--------------+ PERO     Partial                                      Acute          +---------+---------------+---------+-----------+----------+--------------+ Gastroc  Full                                                        +---------+---------------+---------+-----------+----------+--------------+ EIV                     Yes      Yes                                 +---------+---------------+---------+-----------+----------+--------------+ CIV                     Yes      Yes                                 +---------+---------------+---------+-----------+----------+--------------+   +---------+---------------+---------+-----------+----------+--------------+ LEFT     CompressibilityPhasicitySpontaneityPropertiesThrombus Aging +---------+---------------+---------+-----------+----------+--------------+ CFV      Full           Yes      Yes                                 +---------+---------------+---------+-----------+----------+--------------+ SFJ      Full                                                        +---------+---------------+---------+-----------+----------+--------------+ FV Prox  Full                                                         +---------+---------------+---------+-----------+----------+--------------+  FV Mid   Full                                                        +---------+---------------+---------+-----------+----------+--------------+ FV DistalFull                                                        +---------+---------------+---------+-----------+----------+--------------+ PFV      Full                                                        +---------+---------------+---------+-----------+----------+--------------+ POP      None           No       No                   Acute          +---------+---------------+---------+-----------+----------+--------------+ PTV      None                                         Acute          +---------+---------------+---------+-----------+----------+--------------+ PERO     Partial                                      Acute          +---------+---------------+---------+-----------+----------+--------------+     Summary: RIGHT: - Findings consistent with acute deep vein thrombosis involving the right common femoral vein, right femoral vein, right proximal profunda vein, right popliteal vein, right posterior tibial veins, and right peroneal veins. - No cystic structure found in the popliteal fossa.  LEFT: - Findings consistent with acute deep vein thrombosis involving the left popliteal vein, left posterior tibial veins, and left peroneal veins. - No cystic structure found in the popliteal fossa.  *See table(s) above for measurements and observations. Electronically signed by Monica Martinez MD on 03/26/2020 at 4:07:53 PM.    Final     Microbiology: No results found for this or any previous visit (from the past 240 hour(s)).   Labs: Basic Metabolic Panel: Recent Labs  Lab 04/01/20 0449 04/02/20 0454 04/03/20 0512 04/03/20 0515 04/04/20 0954 04/06/20 0421  NA 137 138 139  --  138 138  K 3.3* 3.4* 3.2*  --  4.0 3.6  CL 101 101 101  --   100 101  CO2 22 25 25   --  25 25  GLUCOSE 111* 98 94  --  113* 113*  BUN 12 15 17   --  18 14  CREATININE 0.79 0.86 0.85  --  0.91 0.87  CALCIUM 8.6* 8.7* 8.3*  --  8.6* 8.4*  MG  --   --   --  1.7 2.3 2.0  PHOS  --   --   --   --   --  3.2   Liver Function Tests: No results for input(s): AST, ALT, ALKPHOS, BILITOT, PROT, ALBUMIN in the last 168 hours. No results for input(s): LIPASE, AMYLASE in the last 168 hours. No results for input(s): AMMONIA in the last 168 hours. CBC: Recent Labs  Lab 04/03/20 0512 04/05/20 0300 04/06/20 0421 04/07/20 0451  WBC 9.6  --  12.7* 12.3*  HGB 9.8* 9.4* 10.0* 9.8*  HCT 31.2* 29.6* 31.8* 32.1*  MCV 77.8*  --  78.7* 80.5  PLT 134*  --  108* 110*   Cardiac Enzymes: No results for input(s): CKTOTAL, CKMB, CKMBINDEX, TROPONINI in the last 168 hours. BNP: BNP (last 3 results) No results for input(s): BNP in the last 8760 hours.  ProBNP (last 3 results) No results for input(s): PROBNP in the last 8760 hours.  CBG: Recent Labs  Lab 04/03/20 1210 04/03/20 1646 04/04/20 0758 04/04/20 1137 04/04/20 1632  GLUCAP 135* 120* 94 123* 162*       Signed:  Kayleen Memos, MD [facility] 04/07/2020, 12:59 PM

## 2020-04-07 NOTE — TOC Progression Note (Signed)
Transition of Care [facility]) - Progression Note    Patient Details  Name: Amanda Rivers MRN: [id number] Date of Birth: 04-23-1945  Transition of [facility]) CM/SW Contact  Purcell Mouton, RN Phone Number: 04/07/2020, 11:12 AM  Clinical Narrative:     [employer] rep called with concerns voiced by daughter. Explained that pt is medically stable and [facility] ([facility] was suggested for transportation to [place]. However, daughter states that this is not a Psychiatric nurse, that is private owned.   Expected Discharge Plan: (P) Skilled Nursing Facility Barriers to Discharge: (P) Continued Medical Work up  Expected Discharge Plan and Services Expected Discharge Plan: (P) [place]       Living arrangements for the past 2 months: Single Family Home Expected Discharge Date: 04/07/20                                     Social Determinants of Health (SDOH) Interventions    Readmission Risk Interventions No flowsheet data found.

## 2020-04-08 ENCOUNTER — Ambulatory Visit: Payer: Medicare HMO

## 2020-04-08 MED ORDER — DEXAMETHASONE 2 MG PO TABS: 2.0000 mg | ORAL_TABLET | Freq: Every day | ORAL | Status: DC

## 2020-04-08 MED ORDER — POTASSIUM CHLORIDE 20 MEQ PO PACK: 20.0000 meq | PACK | Freq: Every day | ORAL | Status: DC

## 2020-04-08 MED ORDER — DEXAMETHASONE 0.5 MG PO TABS: 2.0000 mg | ORAL_TABLET | Freq: Every day | ORAL | 0 refills | Status: DC

## 2020-04-08 MED ORDER — DEXAMETHASONE 2 MG PO TABS
2.0000 mg | ORAL_TABLET | Freq: Every day | ORAL | Status: DC
  Administered 2020-04-09: 2 mg via ORAL
  Filled 2020-04-08: qty 1

## 2020-04-08 MED ORDER — DEXAMETHASONE 2 MG PO TABS: 1.0000 mg | ORAL_TABLET | Freq: Every day | ORAL | Status: DC

## 2020-04-08 MED ORDER — DEXAMETHASONE 0.5 MG PO TABS: 0.5000 mg | ORAL_TABLET | Freq: Every day | ORAL | Status: DC

## 2020-04-08 NOTE — Progress Notes (Signed)
Called [facility] transportation service for [employer] ([phone]) and verified that they can transport a patient with oxygen. They stated they can transport patients with O2.

## 2020-04-08 NOTE — Discharge Summary (Addendum)
Discharge Summary  Amanda Rivers IWP:[id number] DOB: [birth year]  PCP: Amanda Lei, MD  Admit date: 03/25/2020 Discharge date: 04/08/2020  Time spent: 35 minutes  Recommendations for Outpatient Follow-up:  1. Follow-up with your PCP in 1 to 2 weeks. 2. Follow-up with medical oncology and radiation oncology in 1 to 2 weeks. 3. Follow-up with pulmonary in 1 to 2 weeks. 4. Follow-up with palliative care medicine in 1 to 2 weeks. 5. Continue PT OT as tolerated with assistance and fall precautions. 6. Take your medications as prescribed. 7. Repeat CMP in 1 week.    Discharge Diagnoses:  Active Hospital Problems   Diagnosis Date Noted  . Community acquired pneumonia of left lower lobe of lung   . Shortness of breath   . Palliative care by specialist   . Goals of care, counseling/discussion   . General weakness   . Pneumonia 03/25/2020    Resolved Hospital Problems  No resolved problems to display.    Discharge Condition: Stable.  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 Brought in due to failure to thrive.  Patient was found to have left upper lobe pneumonia. Also found to have acute Bilateral lower extremity DVT. Hospitalized for further management.  She completed her course of antibiotics.  Due to concern for radiation pneumonitis, she was started on po dexamethasone by pulmonary.  She is currently on Xarelto for acute bilateral lower extremity DVT.   The writer had an extensive conversation with Amanda Rivers's daughter Amanda Rivers via phone on 04/07/20.  Answered questions to the best of my ability.  Patient is medically cleared for discharge to SNF.  She will need to follow up with her providers post  hospitalization as recommended above.  She will need to repeat her CMP in 1 week as recommended above.   Hospital Course:  Active Problems:   Pneumonia   Palliative care by specialist   Goals of care, counseling/discussion   General weakness   Community acquired pneumonia of left lower lobe of lung   Shortness of breath  Treated left upper lobe pneumonia/sepsis, resolved, POA. Concern for radiation pneumonitis, on tapering oral steroid. Patient presented with tachycardia, Tachypnea, leukocytosis, extensive infiltrate left upper lobe. Tuberculosis was a consideration.Patient was seen by her outpatient pulmonologist Amanda Rivers on 03/27/20 who did not think that this was tuberculosis. Isolation precautions were discontinued. Patient was placed on ceftriaxone and azithromycin, completed course. Patient was started on po Decadron 4 mg daily by pulmonology due to concern for radiation pneumonitis. Continue to taper off po Decadron. Follow-up with pulmonary in 1 to 2 weeks.  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 Per pulmonary:  She has a left upper lobe PET avid mass that was initially seen in December.  She has declined work-up and further biopsies for confirmation of metastatic head neck cancer.  She has underwent palliative radiation with Amanda Rivers.  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  Her overall prognosis  is extremely poor.  Patient has declined additional intervention and additional procedures.  There is documentation throughout the medical record of this. Recommend palliative care follow up outpatient  Resolved AKI/normal anion gap metabolic acidosis/hypokalemia and hypomagnesemia. Repeat labs in 1 week.  Resolved sinus tachycardia Continue  Bystolic.  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 Abdominal ultrasound was done which showed gallbladder sludge. No gallstones or findings of acute cholecystitis. No biliary dilatation.  Continue to encourage increase oral intake as tolerated. Continue oral supplement as tolerated.  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 Continue aspiration precautions. Seen by speech therapy, continue dysphagia 2 diet with thin liquids.  Improved acute metabolic encephalopathy Likely due to metabolic derangements, dehydration.  Calm, no behavioral issues reported. Alert and following commands.  She knows where she is and with some help can tell the year and the name of the president.  Acute bilateral lower extremity DVT Patient had bilateral leg pain, D-dimer more than 20. B/L LE duplex ultrasound (03/26/20) revealed acute bilateral lower extremities DVT.  Discussed w/ her oncologist Dr Chryl Rivers. Initially treated with IV heparin. Transitioned to rivaroxaban. Will likely need lifelong anticoagulation due to malignancy.  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  Last radiation 03/05/2020.  Per oncology Amanda Rivers- patient refused chemo and radiation, and refused further work-up of pulmonary nodules with bronchoscopy.  Per pulmonary: Patient's pet imaging was completed on 02/04/2020: This revealed several hypermetabolic pulmonary lesions concerning for metastatic disease. She has a 12.5 x 10 mm left apical nodule SUV max 3.49, 2 ill-defined subsolid nodules within the left upper lobe both with SUV greater than 4 and a 3.5 x 2.5 lesion within the left upper lobe SUV max 7.1 which was the largest of these  there is no significant hypermetabolic mediastinal or hilar adenopathy. Palliative care was consulted in the hospital.  Recommend follow up with palliative care outpatient.  Improving acute thrombocytopenia Platelet count 110 K from 108 K No evidence of bleeding. Follow-up with hematology oncology.  Anemia of chronic disease Hemoglobin 9.8 from 10.0. MCV 80.5. No overt bleeding. Follow-up with hematology oncology.  Leukocytosis, likely reactive in the setting of steroid use. WBC 12.3K from 12.7K. No evidence of active infective process.  Tachycardia/Elevated temperature likely related to her advanced malignancy Follow up with medical oncology and radiation oncology in 1-2 weeks  Prediabetes w/ hyperglycemia HbA1c 6.3 (03/25/20).  Likely exacerbated by steroid use. Decadron is being tapered off.  Abnormal LFTs Abdominal ultrasound did not show explanation for elevated liver chemistries. Continue to avoid hepatotoxic agents Follow-up outpatient with PCP. Repeat CMP in 1 week.  Positive hepatitis C antibody,likely false positive (03/25/20) HCV quantitative test did not detect any virus (03/27/20).  Goals of care Patient noted to be DNR/DNI after pulmonology discussed patient's condition with patient's family. Very poor prognosis in the setting of advanced malignancy involving head neck, several hypermetabolic pulmonary lesions seen on PET imaging completed on 02/04/20 with concern for metastatic disease, severe cachexia and advanced age.  Ambulatory dysfunction/Severe cachexia/Severe physical debility PT OT recommended SNF Continue PT OT as tolerated with assistance and fall precautions     DVT prophylaxis:  Xarelto Code Status:  DNR   Procedures:  None.  Consultations:  Pulmonology.  Discharge Exam:  BP 96/63 (BP Location: Left Arm)   Pulse 93   Temp 97.7 F (36.5 C) (Oral)   Resp 20   Ht 5' 11.5" (1.816 m)   Wt 66.1 kg   SpO2 100%   BMI 20.04  kg/m  . General: 75 y.o. year-old female Cachectic, frail appearing.  In no acute distress.  Alert and oriented. . Cardiovascular: Regular rate and rhythm. No rubs or gallops. Marland Kitchen Respiratory: Slight crackles to the left.  No wheezes.  . Abdomen: Soft non tender, normal bowel sounds present. . Musculoskeletal: Muscle wasting present, thin. Marland Kitchen Psychiatry: Mood is appropriate for condition and setting.  Discharge Instructions You were cared for by a hospitalist during your hospital stay. If you have any questions about your discharge medications or the care you received while you were in the hospital after you are discharged, you can call the unit and asked to speak with the hospitalist on call if the hospitalist that took care of you is not available. Once you are discharged, your primary care physician will handle any further medical issues. Please note that NO REFILLS for any discharge medications will be authorized once you are discharged, as it is imperative that you return to your primary care physician (or establish a relationship with a primary care physician if you do not have one) for your aftercare needs so that they can reassess your need for medications and monitor your lab values.  Discharge Instructions    Call MD for:  difficulty breathing, headache or visual disturbances   Complete by: As directed    Call MD for:  extreme fatigue   Complete by: As directed    Call MD for:  persistant dizziness or light-headedness   Complete by: As directed    Call MD for:  persistant nausea and vomiting   Complete by: As directed    Call MD for:  severe uncontrolled pain   Complete by: As directed    Call MD for:  temperature >100.4   Complete by: As directed    Discharge instructions   Complete by: As directed    Please review instructions on the discharge summary.  You were cared for by a hospitalist during your hospital stay. If you have any questions about your discharge medications or  the care you received while you were in the hospital after you are discharged, you can call the unit and asked to speak with the hospitalist on call if the hospitalist that took care of you is not available. Once you are discharged, your primary care physician will handle any further medical issues. Please note that NO REFILLS for any discharge medications will be authorized once you are discharged, as it is imperative that you return to your primary care physician (or establish a relationship with a primary care physician if you do not have one) for your aftercare needs so that they can reassess your need for medications and monitor your lab values. If you do not have a primary care physician, you can call [phone] for a physician referral.   Increase activity slowly   Complete by: As directed      Allergies as of 04/08/2020   No Known Allergies     Medication List    STOP taking these medications   amLODipine-olmesartan 10-40 MG tablet Commonly known as: AZOR   spironolactone 25 MG tablet Commonly known as: ALDACTONE     TAKE these medications   (feeding supplement) PROSource Plus liquid Take 30 mLs by mouth  3 (three) times daily between meals for 7 days.   feeding supplement Liqd Take 237 mLs by mouth 2 (two) times daily between meals for 7 days.   (feeding supplement) PROSource Plus liquid Take 30 mLs by mouth 2 (two) times daily between meals for 7 days.   dexamethasone 0.5 MG tablet Commonly known as: DECADRON Take 4 tablets (2 mg total) by mouth daily. Please follow these instructions: Take 2 mg daily x 3 days, then Take 1 mg daily x 3 days, then Take 0.5 mg daily x 3 days, then stop. Start taking on: April 09, 2020   dexlansoprazole 60 MG capsule Commonly known as: DEXILANT Take 60 mg by mouth daily.   HYDROcodone-acetaminophen 5-325 MG tablet Commonly known as: NORCO/VICODIN Take 1 tablet by mouth every 6 (six) hours as needed for severe pain.   montelukast 10  MG tablet Commonly known as: SINGULAIR Take 10 mg by mouth at bedtime.   multivitamin with minerals Tabs tablet Take 1 tablet by mouth daily.   nebivolol 10 MG tablet Commonly known as: BYSTOLIC Take 10 mg by mouth daily.   potassium chloride 20 MEQ packet Commonly known as: KLOR-CON Take 20 mEq by mouth daily for 7 days.   primidone 50 MG tablet Commonly known as: MYSOLINE Take 50 mg by mouth 3 (three) times daily.   Rivaroxaban 15 MG Tabs tablet Commonly known as: XARELTO Take 1 tablet (15 mg total) by mouth 2 (two) times daily with a meal for 21 days.   rivaroxaban 20 MG Tabs tablet Commonly known as: XARELTO Take 1 tablet (20 mg total) by mouth daily with supper. Start after completing the twice daily dosing for 3 weeks Start taking on: April 21, 2020   rosuvastatin 20 MG tablet Commonly known as: CRESTOR Take 20 mg by mouth daily.            Durable Medical Equipment  (From admission, onward)         Start     Ordered   04/08/20 0947  For home use only DME lightweight manual wheelchair with seat cushion  Once       Comments: Patient suffers from General weakness, Shortness of breath, acute bilateral lower extremity DVT,which impairs their ability to perform daily activities like bathing, dressing, in the home.  A walker, will not resolve  issue with performing activities of daily living. A wheelchair will allow patient to safely perform daily activities. Patient is not able to propel themselves in the home using a standard weight wheelchair due to Bilateral DVT, shortness of breath. Patient can self propel in the lightweight wheelchair. Length of need life. Accessories: elevating leg rests yes, wheel locks, extensions and anti-tippers.   04/08/20 0952         No Known Allergies  Contact information for follow-up providers    Amanda Lei, MD. Schedule an appointment as soon as possible for a visit in 1 week(s).   Specialty: Family Medicine Contact  information: [address] [place] [address] [phone]            Contact information for after-discharge care    Destination    [facility] Preferred SNF .   Service: Skilled Nursing Contact information: [address] [place] [phone]                   The results of significant diagnostics from this hospitalization (including imaging, microbiology, ancillary and laboratory) are listed below  for reference.    Significant Diagnostic Studies: DG Lumbar Spine Complete  Result Date: 03/25/2020 CLINICAL DATA:  Fall. EXAM: LUMBAR SPINE - COMPLETE 4+ VIEW COMPARISON:  None. FINDINGS: Normal alignment. Negative for fracture. Mild disc degeneration. Facet degeneration L4-5 and L5-S1 Atherosclerotic calcification aorta and iliac arteries without aneurysm. IMPRESSION: Negative for fracture Electronically Signed   By: Franchot Gallo M.D.   On: 03/25/2020 15:27   US Abdomen Complete  Result Date: 03/25/2020 CLINICAL DATA:  Acute kidney injury and elevated LFTs. EXAM: ABDOMEN ULTRASOUND COMPLETE COMPARISON:  PET CT 02/04/2020 FINDINGS: Gallbladder: Physiologically distended. Layering sludge. No gallstones or wall thickening visualized. No sonographic Murphy sign noted by sonographer. Common bile duct: Diameter: 3 mm, normal. Liver: No focal lesion identified. Within normal limits in parenchymal echogenicity. Portal vein is patent on color Doppler imaging with normal direction of blood flow towards the liver. IVC: No abnormality visualized. Pancreas: Visualized portion unremarkable. Spleen: Size and appearance within normal limits. Right Kidney: Length: 10.0 cm. Echogenicity within normal limits. No hydronephrosis. 1.5 cm cyst in the lower kidney. No solid mass or visualized renal calculi. Left Kidney: Length: 8.3 cm. Slight increased renal parenchymal echogenicity. No hydronephrosis. 1.3 cm cyst in the medial kidney. No solid lesion or  visualized renal calculi. Abdominal aorta: No aneurysm visualized.  Atheromatous plaque. Other findings: No abdominal ascites. There is a left pleural effusion. IMPRESSION: 1. Gallbladder sludge. No gallstones or findings of acute cholecystitis. 2. No explanation for elevated LFTs.  No biliary dilatation. 3. No hydronephrosis or obstructive uropathy. Mild left renal atrophy and increased echogenicity. Incidental small bilateral renal cysts. 4. Left pleural effusion. Electronically Signed   By: Keith Rake M.D.   On: 03/25/2020 20:02   DG Chest Port 1 View  Result Date: 03/27/2020 CLINICAL DATA:  Chest pain and shortness of breath, diminished breath sounds by report. EXAM: PORTABLE CHEST 1 VIEW COMPARISON:  Multiple prior studies dating back to 2010, most recent March 25, 2020 FINDINGS: Accounting for differences in technique no interval change in the appearance of interstitial and airspace disease in the LEFT upper chest compared to the recent comparison study. Signs of emphysema in the background as before. Pleural and parenchymal scarring at the RIGHT lung base unchanged. Cardiomediastinal contours and hilar structures are stable with partially obscured LEFT hilar structures due to interstitial and airspace opacities. On limited assessment no acute skeletal process. IMPRESSION: No interval change in the appearance of the chest compared to the recent comparison study. Signs of emphysema with basilar scarring RIGHT greater than LEFT. Findings may represent infection as discussed previously. Note that this patient also has a history of neoplasm. Lymphangitic carcinomatosis is another differential consideration. Would also correlate with whether there has been radiation therapy to this area that could also be a cause of pneumonitis. CT of the chest may be helpful for further evaluation. Electronically Signed   By: Zetta Bills M.D.   On: 03/27/2020 15:24   DG Chest Port 1 View  Result Date:  03/25/2020 CLINICAL DATA:  Fall EXAM: PORTABLE CHEST 1 VIEW COMPARISON:  06/10/2008 FINDINGS: Extensive airspace disease left upper lobe consistent with pneumonia. Underlying emphysema. Blunting right costophrenic angle unchanged compatible with effusion. Right lung otherwise clear Heart size and vascularity normal. Atherosclerotic calcification aortic arch. IMPRESSION: Extensive infiltrate left upper lobe consistent with pneumonia. Possible TB. Small right pleural effusion. Electronically Signed   By: Franchot Gallo M.D.   On: 03/25/2020 15:29   DG Hip Unilat With Pelvis 2-3 Views Left  Result Date: 03/25/2020 CLINICAL DATA:  Fall.  History of lung cancer EXAM: DG HIP (WITH OR WITHOUT PELVIS) 2-3V LEFT COMPARISON:  None. FINDINGS: There is no evidence of hip fracture or dislocation. There is no evidence of arthropathy or other focal bone abnormality. IMPRESSION: Negative. Electronically Signed   By: Franchot Gallo M.D.   On: 03/25/2020 15:26   DG Hip Unilat With Pelvis 2-3 Views Right  Result Date: 03/25/2020 CLINICAL DATA:  Fall.  Hip pain.  Lung cancer history EXAM: DG HIP (WITH OR WITHOUT PELVIS) 2-3V RIGHT COMPARISON:  None. FINDINGS: There is no evidence of hip fracture or dislocation. There is no evidence of arthropathy or other focal bone abnormality. IMPRESSION: Negative. Electronically Signed   By: Franchot Gallo M.D.   On: 03/25/2020 15:26   VAS [place] LOWER EXTREMITY VENOUS (DVT)  Result Date: 03/26/2020  Lower Venous DVT Study Indications: Pain.  Risk Factors: None identified. Comparison Study: No prior studies. Performing Technologist: Oliver Hum RVT  Examination Guidelines: A complete evaluation includes B-mode imaging, spectral Doppler, color Doppler, and power Doppler as needed of all accessible portions of each vessel. Bilateral testing is considered an integral part of a complete examination. Limited examinations for reoccurring indications may be performed as noted. The reflux portion of  the exam is performed with the patient in reverse Trendelenburg.  +---------+---------------+---------+-----------+----------+--------------+ RIGHT    CompressibilityPhasicitySpontaneityPropertiesThrombus Aging +---------+---------------+---------+-----------+----------+--------------+ CFV      None           No       No                   Acute          +---------+---------------+---------+-----------+----------+--------------+ SFJ      Full                                                        +---------+---------------+---------+-----------+----------+--------------+ FV Prox  None           No       No                   Acute          +---------+---------------+---------+-----------+----------+--------------+ FV Mid   None           No       No                   Acute          +---------+---------------+---------+-----------+----------+--------------+ FV DistalNone           No       No                   Acute          +---------+---------------+---------+-----------+----------+--------------+ PFV      None           No       No                   Acute          +---------+---------------+---------+-----------+----------+--------------+ POP      None           No       No  Acute          +---------+---------------+---------+-----------+----------+--------------+ PTV      None                                         Acute          +---------+---------------+---------+-----------+----------+--------------+ PERO     Partial                                      Acute          +---------+---------------+---------+-----------+----------+--------------+ Gastroc  Full                                                        +---------+---------------+---------+-----------+----------+--------------+ EIV                     Yes      Yes                                  +---------+---------------+---------+-----------+----------+--------------+ CIV                     Yes      Yes                                 +---------+---------------+---------+-----------+----------+--------------+   +---------+---------------+---------+-----------+----------+--------------+ LEFT     CompressibilityPhasicitySpontaneityPropertiesThrombus Aging +---------+---------------+---------+-----------+----------+--------------+ CFV      Full           Yes      Yes                                 +---------+---------------+---------+-----------+----------+--------------+ SFJ      Full                                                        +---------+---------------+---------+-----------+----------+--------------+ FV Prox  Full                                                        +---------+---------------+---------+-----------+----------+--------------+ FV Mid   Full                                                        +---------+---------------+---------+-----------+----------+--------------+ FV DistalFull                                                        +---------+---------------+---------+-----------+----------+--------------+  PFV      Full                                                        +---------+---------------+---------+-----------+----------+--------------+ POP      None           No       No                   Acute          +---------+---------------+---------+-----------+----------+--------------+ PTV      None                                         Acute          +---------+---------------+---------+-----------+----------+--------------+ PERO     Partial                                      Acute          +---------+---------------+---------+-----------+----------+--------------+     Summary: RIGHT: - Findings consistent with acute deep vein thrombosis involving the right common femoral vein, right  femoral vein, right proximal profunda vein, right popliteal vein, right posterior tibial veins, and right peroneal veins. - No cystic structure found in the popliteal fossa.  LEFT: - Findings consistent with acute deep vein thrombosis involving the left popliteal vein, left posterior tibial veins, and left peroneal veins. - No cystic structure found in the popliteal fossa.  *See table(s) above for measurements and observations. Electronically signed by Monica Martinez MD on 03/26/2020 at 4:07:53 PM.    Final     Microbiology: No results found for this or any previous visit (from the past 240 hour(s)).   Labs: Basic Metabolic Panel: Recent Labs  Lab 04/02/20 0454 04/03/20 0512 04/03/20 0515 04/04/20 0954 04/06/20 0421  NA 138 139  --  138 138  K 3.4* 3.2*  --  4.0 3.6  CL 101 101  --  100 101  CO2 25 25  --  25 25  GLUCOSE 98 94  --  113* 113*  BUN 15 17  --  18 14  CREATININE 0.86 0.85  --  0.91 0.87  CALCIUM 8.7* 8.3*  --  8.6* 8.4*  MG  --   --  1.7 2.3 2.0  PHOS  --   --   --   --  3.2   Liver Function Tests: No results for input(s): AST, ALT, ALKPHOS, BILITOT, PROT, ALBUMIN in the last 168 hours. No results for input(s): LIPASE, AMYLASE in the last 168 hours. No results for input(s): AMMONIA in the last 168 hours. CBC: Recent Labs  Lab 04/03/20 0512 04/05/20 0300 04/06/20 0421 04/07/20 0451  WBC 9.6  --  12.7* 12.3*  HGB 9.8* 9.4* 10.0* 9.8*  HCT 31.2* 29.6* 31.8* 32.1*  MCV 77.8*  --  78.7* 80.5  PLT 134*  --  108* 110*   Cardiac Enzymes: No results for input(s): CKTOTAL, CKMB, CKMBINDEX, TROPONINI in the last 168 hours. BNP: BNP (last 3 results) No results for input(s): BNP in the last 8760 hours.  ProBNP (last 3 results) No results for input(s):  PROBNP in the last 8760 hours.  CBG: Recent Labs  Lab 04/03/20 1210 04/03/20 1646 04/04/20 0758 04/04/20 1137 04/04/20 1632  GLUCAP 135* 120* 94 123* 162*       Signed:  Kayleen Memos, MD [facility] 04/08/2020, 12:45 PM

## 2020-04-08 NOTE — TOC Progression Note (Addendum)
Transition of Care [facility]) - Progression Note    Patient Details  Name: Gissella Niblack MRN: [id number] Date of Birth: Apr 09, 1945  Transition of [facility]) CM/SW Contact  Purcell Mouton, RN Phone Number: 04/08/2020, 10:00 AM  Clinical Narrative:     Spoke with pt's daughter [name] this AM after checking on transportation. Pt will travel [employer], non-emergency, with CPR trained personnel via Wheel Chair(WC). Will order WC for pt. Daughter was made aware and was okay with this. Daughter [name] will like to be called when pt leaves the hospital. [facility], [place], [place], [place] [address] number [phone]. Facility will need to get insurance authorization.   Expected Discharge Plan: (P) Skilled Nursing Facility Barriers to Discharge: (P) Continued Medical Work up  Expected Discharge Plan and Services Expected Discharge Plan: (P) [place]       Living arrangements for the past 2 months: Single Family Home Expected Discharge Date: 04/08/20                                     Social Determinants of Health (SDOH) Interventions    Readmission Risk Interventions No flowsheet data found.

## 2020-04-08 NOTE — Progress Notes (Addendum)
Pt bladder scanned due to urine incontinent episodes and no measured output. Bladder scan yielded 8ml. MD present and acknowledged. Pt encouraged to increase oral intake or food and liquid.

## 2020-04-08 NOTE — Plan of Care (Signed)
  Problem: Activity: Goal: Risk for activity intolerance will decrease Outcome: Progressing   Problem: Nutrition: Goal: Adequate nutrition will be maintained Outcome: Progressing   Problem: Elimination: Goal: Will not experience complications related to bowel motility Outcome: Progressing Goal: Will not experience complications related to urinary retention Outcome: Progressing   Problem: Elimination: Goal: Will not experience complications related to bowel motility Outcome: Progressing Goal: Will not experience complications related to urinary retention Outcome: Progressing

## 2020-04-08 NOTE — Progress Notes (Signed)
SLP Cancellation Note  Patient Details Name: Amanda Rivers MRN: [id number] DOB: 11-25-1945   Cancelled treatment:       Reason Eval/Treat Not Completed: Other (comment) (Pt medically stable for dc per notes, will sign off. Thanks.)  Kathleen Lime, MS [facility] SLP Acute Rehab Services Office [phone] Pager [phone]   Macario Golds 04/08/2020, 6:11 PM

## 2020-04-08 NOTE — Care Management (Signed)
    Durable Medical Equipment  (From admission, onward)         Start     Ordered   04/08/20 0947  For home use only DME lightweight manual wheelchair with seat cushion  Once       Comments: Patient suffers from General weakness, Shortness of breath, acute bilateral lower extremity DVT,which impairs their ability to perform daily activities like bathing, dressing, in the home.  A walker, will not resolve  issue with performing activities of daily living. A wheelchair will allow patient to safely perform daily activities. Patient is not able to propel themselves in the home using a standard weight wheelchair due to Bilateral DVT, shortness of breath. Patient can self propel in the lightweight wheelchair. Length of need life. Accessories: elevating leg rests yes, wheel locks, extensions and anti-tippers.   04/08/20 2081

## 2020-04-08 NOTE — TOC Progression Note (Signed)
Transition of Care [facility]) - Progression Note    Patient Details  Name: Amanda Rivers MRN: [id number] Date of Birth: 1945-08-16  Transition of Care [facility]) CM/SW Contact  Purcell Mouton, RN Phone Number: 04/08/2020, 9:07 AM  Clinical Narrative:    Checking on transportation and cost for pt to transport to [place].    Expected Discharge Plan: (P) Skilled Nursing Facility Barriers to Discharge: (P) Continued Medical Work up  Expected Discharge Plan and Services Expected Discharge Plan: (P) [place]       Living arrangements for the past 2 months: Single Family Home Expected Discharge Date: 04/08/20                                     Social Determinants of Health (SDOH) Interventions    Readmission Risk Interventions No flowsheet data found.

## 2020-04-09 ENCOUNTER — Encounter (HOSPITAL_COMMUNITY): Payer: Self-pay

## 2020-04-09 ENCOUNTER — Ambulatory Visit: Payer: Medicare HMO

## 2020-04-09 DIAGNOSIS — Z20822 Contact with and (suspected) exposure to covid-19: Secondary | ICD-10-CM | POA: Diagnosis not present

## 2020-04-09 DIAGNOSIS — R1312 Dysphagia, oropharyngeal phase: Secondary | ICD-10-CM | POA: Diagnosis not present

## 2020-04-09 DIAGNOSIS — R2681 Unsteadiness on feet: Secondary | ICD-10-CM | POA: Diagnosis not present

## 2020-04-09 DIAGNOSIS — N179 Acute kidney failure, unspecified: Secondary | ICD-10-CM | POA: Diagnosis not present

## 2020-04-09 DIAGNOSIS — J181 Lobar pneumonia, unspecified organism: Secondary | ICD-10-CM | POA: Diagnosis not present

## 2020-04-09 DIAGNOSIS — C7800 Secondary malignant neoplasm of unspecified lung: Secondary | ICD-10-CM | POA: Diagnosis not present

## 2020-04-09 DIAGNOSIS — C4442 Squamous cell carcinoma of skin of scalp and neck: Secondary | ICD-10-CM | POA: Diagnosis not present

## 2020-04-09 DIAGNOSIS — G9341 Metabolic encephalopathy: Secondary | ICD-10-CM | POA: Diagnosis not present

## 2020-04-09 DIAGNOSIS — C4492 Squamous cell carcinoma of skin, unspecified: Secondary | ICD-10-CM | POA: Diagnosis not present

## 2020-04-09 DIAGNOSIS — A419 Sepsis, unspecified organism: Secondary | ICD-10-CM | POA: Diagnosis not present

## 2020-04-09 DIAGNOSIS — J439 Emphysema, unspecified: Secondary | ICD-10-CM | POA: Diagnosis not present

## 2020-04-09 DIAGNOSIS — M6281 Muscle weakness (generalized): Secondary | ICD-10-CM | POA: Diagnosis not present

## 2020-04-09 DIAGNOSIS — E872 Acidosis: Secondary | ICD-10-CM | POA: Diagnosis not present

## 2020-04-09 DIAGNOSIS — I82403 Acute embolism and thrombosis of unspecified deep veins of lower extremity, bilateral: Secondary | ICD-10-CM | POA: Diagnosis not present

## 2020-04-09 DIAGNOSIS — D696 Thrombocytopenia, unspecified: Secondary | ICD-10-CM | POA: Diagnosis not present

## 2020-04-09 DIAGNOSIS — R627 Adult failure to thrive: Secondary | ICD-10-CM | POA: Diagnosis not present

## 2020-04-09 DIAGNOSIS — G9340 Encephalopathy, unspecified: Secondary | ICD-10-CM | POA: Diagnosis not present

## 2020-04-09 DIAGNOSIS — Z9189 Other specified personal risk factors, not elsewhere classified: Secondary | ICD-10-CM | POA: Diagnosis not present

## 2020-04-09 DIAGNOSIS — I10 Essential (primary) hypertension: Secondary | ICD-10-CM | POA: Diagnosis not present

## 2020-04-09 DIAGNOSIS — J9601 Acute respiratory failure with hypoxia: Secondary | ICD-10-CM | POA: Diagnosis not present

## 2020-04-09 DIAGNOSIS — E785 Hyperlipidemia, unspecified: Secondary | ICD-10-CM | POA: Diagnosis not present

## 2020-04-09 DIAGNOSIS — Z515 Encounter for palliative care: Secondary | ICD-10-CM | POA: Diagnosis not present

## 2020-04-09 DIAGNOSIS — J189 Pneumonia, unspecified organism: Secondary | ICD-10-CM | POA: Diagnosis not present

## 2020-04-09 DIAGNOSIS — R918 Other nonspecific abnormal finding of lung field: Secondary | ICD-10-CM | POA: Diagnosis not present

## 2020-04-09 DIAGNOSIS — R54 Age-related physical debility: Secondary | ICD-10-CM | POA: Diagnosis not present

## 2020-04-09 DIAGNOSIS — R0602 Shortness of breath: Secondary | ICD-10-CM | POA: Diagnosis not present

## 2020-04-09 DIAGNOSIS — Z681 Body mass index (BMI) 19 or less, adult: Secondary | ICD-10-CM | POA: Diagnosis not present

## 2020-04-09 DIAGNOSIS — I248 Other forms of acute ischemic heart disease: Secondary | ICD-10-CM | POA: Diagnosis not present

## 2020-04-09 DIAGNOSIS — J9602 Acute respiratory failure with hypercapnia: Secondary | ICD-10-CM | POA: Diagnosis not present

## 2020-04-09 DIAGNOSIS — Z66 Do not resuscitate: Secondary | ICD-10-CM | POA: Diagnosis not present

## 2020-04-09 DIAGNOSIS — C159 Malignant neoplasm of esophagus, unspecified: Secondary | ICD-10-CM | POA: Diagnosis not present

## 2020-04-09 DIAGNOSIS — Z86718 Personal history of other venous thrombosis and embolism: Secondary | ICD-10-CM | POA: Diagnosis not present

## 2020-04-09 DIAGNOSIS — R0603 Acute respiratory distress: Secondary | ICD-10-CM | POA: Diagnosis not present

## 2020-04-09 DIAGNOSIS — Z7409 Other reduced mobility: Secondary | ICD-10-CM | POA: Diagnosis not present

## 2020-04-09 DIAGNOSIS — D631 Anemia in chronic kidney disease: Secondary | ICD-10-CM | POA: Diagnosis not present

## 2020-04-09 DIAGNOSIS — R531 Weakness: Secondary | ICD-10-CM | POA: Diagnosis not present

## 2020-04-09 DIAGNOSIS — Z7189 Other specified counseling: Secondary | ICD-10-CM | POA: Diagnosis not present

## 2020-04-09 MED ORDER — RIVAROXABAN 15 MG PO TABS: 15.0000 mg | ORAL_TABLET | Freq: Two times a day (BID) | ORAL | 0 refills | Status: AC

## 2020-04-09 MED ORDER — DEXAMETHASONE 2 MG PO TABS: 2.0000 mg | ORAL_TABLET | Freq: Every day | ORAL | 0 refills | Status: AC

## 2020-04-09 MED ORDER — RIVAROXABAN 20 MG PO TABS: 20.0000 mg | ORAL_TABLET | Freq: Every day | ORAL | 0 refills | Status: DC

## 2020-04-09 MED ORDER — DEXAMETHASONE 0.5 MG PO TABS: 0.5000 mg | ORAL_TABLET | Freq: Every day | ORAL | 0 refills | Status: AC

## 2020-04-09 MED ORDER — RIVAROXABAN 15 MG PO TABS: 15.0000 mg | ORAL_TABLET | Freq: Two times a day (BID) | ORAL | 0 refills | Status: DC

## 2020-04-09 MED ORDER — RIVAROXABAN 20 MG PO TABS: 20.0000 mg | ORAL_TABLET | Freq: Every day | ORAL | 0 refills | Status: AC

## 2020-04-09 MED ORDER — DEXAMETHASONE 1 MG PO TABS: 1.0000 mg | ORAL_TABLET | Freq: Every day | ORAL | 0 refills | Status: AC

## 2020-04-09 NOTE — Progress Notes (Signed)
Daily Progress Note   Patient Name: Amanda Rivers       Date: 04/09/2020 DOB: 1945-12-30  Age: 75 y.o. MRN#: [id number] Attending Physician: Kayleen Memos, DO Primary Care Physician: Lucianne Lei, MD Admit Date: 03/25/2020  Reason for Consultation/Follow-up: Establishing goals of care  Subjective:  patient is awake and alert this morning, she is resting in bed, she appears with generalized weakness, has supplemental O2. Patient state she just doesn't have much of an appetite, she is asking when she will go to facility to be closer to her daughter, SNF in [place], [place] where daughter [name] lives is being arranged, discussed with bedside RN, [facility] colleague Ms Cookie as well as with Dr Nevada Crane [facility]. Call placed and discussed with daughter [name] as well.    Length of Stay: 15  Current Medications: Scheduled Meds:  . (feeding supplement) PROSource Plus  30 mL Oral TID BM  . dexamethasone  2 mg Oral Daily   Followed by  . [START ON 04/12/2020] dexamethasone  1 mg Oral Daily   Followed by  . [START ON 04/15/2020] dexamethasone  0.5 mg Oral Daily  . feeding supplement  1 Container Oral BID BM  . feeding supplement  237 mL Oral BID BM  . montelukast  10 mg Oral QHS  . multivitamin with minerals  1 tablet Oral Daily  . nebivolol  10 mg Oral Daily  . pantoprazole  40 mg Oral Daily  . primidone  50 mg Oral TID  . Rivaroxaban  15 mg Oral BID WC   Followed by  . [START ON 04/18/2020] rivaroxaban  20 mg Oral Q supper  . rosuvastatin  20 mg Oral Daily    Continuous Infusions:   PRN Meds: acetaminophen **OR** acetaminophen, HYDROcodone-acetaminophen, levalbuterol  Physical Exam         Resting in bed Regular work of breathing S 1 S 2  Appears chronically ill No focal deficits.  Abdomen not  distended.   Vital Signs: BP 112/72 (BP Location: Left Arm)   Pulse 98   Temp 98.1 F (36.7 C) (Oral)   Resp 20   Ht 5' 11.5" (1.816 m)   Wt 66.1 kg   SpO2 98%   BMI 20.04 kg/m  SpO2: SpO2: 98 % O2 Device: O2 Device: Nasal Cannula O2 Flow Rate: O2 Flow  Rate (L/min): 2 L/min  Intake/output summary:  No intake or output data in the 24 hours ending 04/09/20 1034 LBM: Last BM Date: 04/08/20 Baseline Weight: Weight: 66.2 kg Most recent weight: Weight: 66.1 kg      PPS 50% Palliative Assessment/Data:    Flowsheet Rows   Flowsheet Row Most Recent Value  Intake Tab   Referral Department Hospitalist  Unit at Time of Referral Other (Comment)  Palliative Care Primary Diagnosis Other (Comment)  [ftt]  Date Notified 03/26/20  Palliative Care Type New Palliative care  Reason for referral Clarify Goals of Care  Date of Admission 03/25/20  Date first seen by Palliative Care 03/26/20  # of days Palliative referral response time 0 Day(s)  # of days IP prior to Palliative referral 1  Clinical Assessment   Psychosocial & Spiritual Assessment   Palliative Care Outcomes       Patient Active Problem List   Diagnosis Date Noted  . Community acquired pneumonia of left lower lobe of lung   . Shortness of breath   . Palliative care by specialist   . Goals of care, counseling/discussion   . General weakness   . Pneumonia 03/25/2020  . 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 Tremor 06/02/2019  . HTN (hypertension) 06/02/2019  . HLD (hyperlipidemia) 06/02/2019  . History of lung cancer 06/02/2019  . Chronic hyponatremia 06/02/2019  . GERD (gastroesophageal reflux disease) 06/02/2019  . 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  Brought into the emergency department by family  because of failure to thrive going on for 2-3 weeks, markedly diminished oral intake and a functional decline as well as incontinence.  Work-up in the emergency department showed acute kidney injury as well as pneumonia.  Patient remains admitted to hospital medicine service.  Further work-up showed DVT and talks are being held regarding appropriate anticoagulation.  Occult oncology has also been consulted.  Patient was seeing radiation oncology in the outpatient setting.  Palliative consultation has been requested for CODE STATUS and goals of care discussions. Assessment:  appears with generalized weakness and ongoing slow progressive functional decline, how ever, at this time, in my opinion, patient doesn't have acute symptom burden, denies pain, doesn't appear to be in distress. Not yet residential hospice appropriate, in my opinion. Daughter and grand daughter have made arrangements along with [facility] for SNF rehab attempt in [place], [place], recommend palliative follow up over there, see below.   Recommendations/Plan:  DNR DNI Continue current mode of care.  Recommend SNF rehab with palliative on discharge, discussed with patient, TRH, daughter on the phone.  Patient wishes to be closer to where her daughter is, at this time, arrangements are being made for safe transfer to SNF in [place], [place]. Appreciate TOC efforts.   Code Status:    Code Status Orders  (From admission, onward)         Start     Ordered   03/27/20 1800  Do not attempt resuscitation (DNR)  Continuous       Question Answer Comment  In the event of cardiac or respiratory ARREST Do not call a "code blue"   In the event of cardiac or respiratory ARREST Do not perform Intubation, CPR, defibrillation or ACLS   In the event of cardiac or respiratory ARREST Use medication by  any route, position, wound care, and other measures to relive pain and suffering. May use oxygen, suction and manual treatment of airway obstruction as  needed for comfort.      03/27/20 1759        Code Status History    Date Active Date Inactive Code Status Order ID Comments User Context   03/25/2020 3009 03/27/2020 1759 Full Code [id number]  Jonnie Finner DO ED   Advance Care Planning Activity      Prognosis: Guarded  Discharge Planning:  SNF rehab with palliative, as per patient/family preference in [place], [place].   Care plan was discussed with IDT.     Daughter [name] updated on the phone   2-6 Thank you for allowing the Palliative Medicine Team to assist in the care of this patient.   Time In: 10 Time Out: 10.35 Total Time 35 Prolonged Time Billed No       Greater than 50%  of this time was spent counseling and coordinating care related to the above assessment and plan.  Loistine Chance, MD  Please contact Palliative Medicine Team phone at [phone] for questions and concerns.

## 2020-04-09 NOTE — Discharge Summary (Addendum)
Discharge Summary  Amanda Rivers VEH:[id number] DOB: 1946/01/04  PCP: Lucianne Lei, MD  Admit date: 03/25/2020 Discharge date: 04/09/2020  Time spent: 35 minutes  Recommendations for Outpatient Follow-up:  1. Follow-up with your PCP in 1 to 2 weeks. 2. Follow-up with medical oncology and radiation oncology in 1 to 2 weeks. 3. Follow-up with pulmonary in 1 to 2 weeks. 4. Follow-up with palliative care medicine within a week. 5. Recommend hospice care if no improvement. 6. Continue PT OT as tolerated with assistance and fall precautions. 7. Take your medications as prescribed. 8. Repeat CMP in 1 week.    Discharge Diagnoses:  Active Hospital Problems   Diagnosis Date Noted  . Community acquired pneumonia of left lower lobe of lung   . Shortness of breath   . Palliative care by specialist   . Goals of care, counseling/discussion   . General weakness   . Pneumonia 03/25/2020    Resolved Hospital Problems  No resolved problems to display.    Discharge Condition: Stable.  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 Brought in due to failure to thrive.  Patient was found to have left upper lobe pneumonia. Also found to have acute Bilateral lower extremity DVT. Hospitalized for further management.  She completed her course of antibiotics.  Due to concern for radiation pneumonitis, she was started on po dexamethasone by pulmonary.  She is currently on Xarelto for acute bilateral lower extremity DVT.   The writer had an extensive conversation with Amanda Rivers's daughter Amanda Rivers via phone on 04/07/20.  Answered questions to the best of my ability.  Patient is medically cleared for  discharge to SNF.  She will need to follow up with her providers post hospitalization as recommended above.  She will need to repeat her CMP in 1 week as recommended above.  04/09/20:  Seen and examined at her bedside.  She looks frail.  She denies any pain or shortness of breath.   Hospital Course:  Active Problems:   Pneumonia   Palliative care by specialist   Goals of care, counseling/discussion   General weakness   Community acquired pneumonia of left lower lobe of lung   Shortness of breath  Treated left upper lobe pneumonia/sepsis, resolved, POA. Concern for radiation pneumonitis, on tapering oral steroid. Patient presented with tachycardia, Tachypnea, leukocytosis, extensive infiltrate left upper lobe. Tuberculosis was a consideration.Patient was seen by her outpatient pulmonologist Dr. Valeta Harms on 03/27/20 who did not think that this was tuberculosis. Isolation precautions were discontinued. Patient was placed on ceftriaxone and azithromycin, completed course. Patient was started on po Decadron 4 mg daily by pulmonology due to concern for radiation pneumonitis. Continue to taper off po Decadron. Follow-up with pulmonary in 1 to 2 weeks.  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 Per pulmonary:  She has a left upper lobe PET avid mass that was initially seen in December.  She has declined work-up and further biopsies for confirmation of metastatic head neck cancer.  She has underwent palliative radiation with Dr. Isidore Moos.  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  Her overall prognosis is extremely poor.  Patient has declined additional intervention and additional procedures.  There is documentation throughout the medical record of this. Recommend palliative care  follow up outpatient  Resolved AKI/normal anion gap metabolic acidosis/hypokalemia and hypomagnesemia. Repeat labs in 1 week.  Resolved sinus tachycardia Continue Bystolic.  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 Abdominal ultrasound was done which showed gallbladder sludge. No gallstones or findings of acute cholecystitis. No biliary dilatation.  Continue to encourage increase oral intake as tolerated. Continue oral supplement as tolerated.  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 Continue aspiration precautions. Seen by speech therapy, continue dysphagia 2 diet with thin liquids.  Improved acute metabolic encephalopathy Likely due to metabolic derangements, dehydration.  Calm, no behavioral issues reported. Alert and following commands.  She knows where she is and with some help can tell the year and the name of the president.  Acute bilateral lower extremity DVT Patient had bilateral leg pain, D-dimer more than 20. B/L LE duplex ultrasound (03/26/20) revealed acute bilateral lower extremities DVT.  Discussed w/ her oncologist Dr Chryl Heck. Initially treated with IV heparin. Transitioned to rivaroxaban. Will likely need lifelong anticoagulation due to malignancy.  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 Isidore Moos and Dr. Chryl Heck.  Last radiation 03/05/2020.  Per oncology Dr. Chryl Heck- patient refused chemo and radiation, and refused further work-up of pulmonary nodules with bronchoscopy.  Per pulmonary: Patient's pet imaging was completed on 02/04/2020: This revealed several hypermetabolic pulmonary lesions concerning for metastatic disease. She has a 12.5 x 10 mm left apical nodule SUV max 3.49, 2 ill-defined  subsolid nodules within the left upper lobe both with SUV greater than 4 and a 3.5 x 2.5 lesion within the left upper lobe SUV max 7.1 which was the largest of these there is no significant hypermetabolic mediastinal or hilar adenopathy. Palliative care was consulted in the hospital.  Recommend follow up with palliative care outpatient.  Improving acute thrombocytopenia Platelet count 110 K from 108 K No evidence of bleeding. Follow-up with hematology oncology.  Anemia of chronic disease Hemoglobin 9.8 from 10.0. MCV 80.5. No overt bleeding. Follow-up with hematology oncology.  Leukocytosis, likely reactive in the setting of steroid use. WBC 12.3K from 12.7K. No evidence of active infective process.  Tachycardia/Elevated temperature likely related to her advanced malignancy Follow up with medical oncology and radiation oncology in 1-2 weeks  Prediabetes w/ hyperglycemia HbA1c 6.3 (03/25/20).  Likely exacerbated by steroid use. Decadron is being tapered off.  Abnormal LFTs Abdominal ultrasound did not show explanation for elevated liver chemistries. Continue to avoid hepatotoxic agents Follow-up outpatient with PCP. Repeat CMP in 1 week.  Positive hepatitis C antibody,likely false positive (03/25/20) HCV quantitative test did not detect any virus (03/27/20).  Goals of care Patient noted to be DNR/DNI after pulmonology discussed patient's condition with patient's family. Very poor prognosis in the setting of advanced malignancy involving head neck, several hypermetabolic pulmonary lesions seen on PET imaging completed on 02/04/20 with concern for metastatic disease, severe cachexia and advanced age.  Ambulatory dysfunction/Severe cachexia/Severe physical debility PT OT recommended SNF Continue PT OT as  tolerated with assistance and fall precautions     DVT prophylaxis:  Xarelto Code Status:  DNR   Procedures:  None.  Consultations:  Pulmonology.  Discharge  Exam:  No significant changes from prior. BP 112/72 (BP Location: Left Arm)   Pulse 98   Temp 98.1 F (36.7 C) (Oral)   Resp 20   Ht 5' 11.5" (1.816 m)   Wt 66.1 kg   SpO2 98%   BMI 20.04 kg/m  . General: 75 y.o. year-old female Cachectic, frail appearing.  Anxious.  Alert. . Cardiovascular: Regular rate and rhythm. No rubs or gallops. Marland Kitchen Respiratory: Slight crackles to the left.  No wheezes.  . Abdomen: Soft non tender, normal bowel sounds present. . Musculoskeletal: Muscle wasting present, thin. Marland Kitchen Psychiatry: Mood is appropriate for condition and setting.  Discharge Instructions You were cared for by a hospitalist during your hospital stay. If you have any questions about your discharge medications or the care you received while you were in the hospital after you are discharged, you can call the unit and asked to speak with the hospitalist on call if the hospitalist that took care of you is not available. Once you are discharged, your primary care physician will handle any further medical issues. Please note that NO REFILLS for any discharge medications will be authorized once you are discharged, as it is imperative that you return to your primary care physician (or establish a relationship with a primary care physician if you do not have one) for your aftercare needs so that they can reassess your need for medications and monitor your lab values.  Discharge Instructions    Call MD for:  difficulty breathing, headache or visual disturbances   Complete by: As directed    Call MD for:  extreme fatigue   Complete by: As directed    Call MD for:  persistant dizziness or light-headedness   Complete by: As directed    Call MD for:  persistant nausea and vomiting   Complete by: As directed    Call MD for:  severe uncontrolled pain   Complete by: As directed    Call MD for:  temperature >100.4   Complete by: As directed    Discharge instructions   Complete by: As directed    Please  review instructions on the discharge summary.  You were cared for by a hospitalist during your hospital stay. If you have any questions about your discharge medications or the care you received while you were in the hospital after you are discharged, you can call the unit and asked to speak with the hospitalist on call if the hospitalist that took care of you is not available. Once you are discharged, your primary care physician will handle any further medical issues. Please note that NO REFILLS for any discharge medications will be authorized once you are discharged, as it is imperative that you return to your primary care physician (or establish a relationship with a primary care physician if you do not have one) for your aftercare needs so that they can reassess your need for medications and monitor your lab values. If you do not have a primary care physician, you can call [phone] for a physician referral.   Increase activity slowly   Complete by: As directed      Allergies as of 04/09/2020   No Known Allergies     Medication List    STOP taking these medications   amLODipine-olmesartan 10-40 MG tablet Commonly known  asLeotis Shames   spironolactone 25 MG tablet Commonly known as: ALDACTONE     TAKE these medications   (feeding supplement) PROSource Plus liquid Take 30 mLs by mouth 3 (three) times daily between meals for 7 days.   feeding supplement Liqd Take 237 mLs by mouth 2 (two) times daily between meals for 7 days.   (feeding supplement) PROSource Plus liquid Take 30 mLs by mouth 2 (two) times daily between meals for 7 days.   dexamethasone 2 MG tablet Commonly known as: DECADRON Take 1 tablet (2 mg total) by mouth daily for 3 doses.   dexamethasone 1 MG tablet Commonly known as: DECADRON Take 1 tablet (1 mg total) by mouth daily for 3 doses. Start taking on: April 12, 2020   dexamethasone 0.5 MG tablet Commonly known as: DECADRON Take 1 tablet (0.5 mg total) by mouth  daily for 3 doses. Start taking on: April 15, 2020   dexlansoprazole 60 MG capsule Commonly known as: DEXILANT Take 60 mg by mouth daily.   HYDROcodone-acetaminophen 5-325 MG tablet Commonly known as: NORCO/VICODIN Take 1 tablet by mouth every 6 (six) hours as needed for severe pain.   montelukast 10 MG tablet Commonly known as: SINGULAIR Take 10 mg by mouth at bedtime.   multivitamin with minerals Tabs tablet Take 1 tablet by mouth daily.   nebivolol 10 MG tablet Commonly known as: BYSTOLIC Take 10 mg by mouth daily.   primidone 50 MG tablet Commonly known as: MYSOLINE Take 50 mg by mouth 3 (three) times daily.   Rivaroxaban 15 MG Tabs tablet Commonly known as: XARELTO Take 1 tablet (15 mg total) by mouth 2 (two) times daily with a meal for 21 days.   rivaroxaban 20 MG Tabs tablet Commonly known as: XARELTO Take 1 tablet (20 mg total) by mouth daily with supper. Start after completing the twice daily dosing for 3 weeks Start taking on: April 18, 2020   rosuvastatin 20 MG tablet Commonly known as: CRESTOR Take 20 mg by mouth daily.            Durable Medical Equipment  (From admission, onward)         Start     Ordered   04/08/20 0947  For home use only DME lightweight manual wheelchair with seat cushion  Once       Comments: Patient suffers from General weakness, Shortness of breath, acute bilateral lower extremity DVT,which impairs their ability to perform daily activities like bathing, dressing, in the home.  A walker, will not resolve  issue with performing activities of daily living. A wheelchair will allow patient to safely perform daily activities. Patient is not able to propel themselves in the home using a standard weight wheelchair due to Bilateral DVT, shortness of breath. Patient can self propel in the lightweight wheelchair. Length of need life. Accessories: elevating leg rests yes, wheel locks, extensions and anti-tippers.   04/08/20 0952          No Known Allergies  Contact information for follow-up providers    Lucianne Lei, MD. Schedule an appointment as soon as possible for a visit in 1 week(s).   Specialty: Family Medicine Contact information: [address] [place] [address] [phone]            Contact information for after-discharge care    Destination    [facility] Preferred SNF .   Service: Skilled Nursing Contact information: [address] [place] [phone]  The results of significant diagnostics from this hospitalization (including imaging, microbiology, ancillary and laboratory) are listed below for reference.    Significant Diagnostic Studies: DG Lumbar Spine Complete  Result Date: 03/25/2020 CLINICAL DATA:  Fall. EXAM: LUMBAR SPINE - COMPLETE 4+ VIEW COMPARISON:  None. FINDINGS: Normal alignment. Negative for fracture. Mild disc degeneration. Facet degeneration L4-5 and L5-S1 Atherosclerotic calcification aorta and iliac arteries without aneurysm. IMPRESSION: Negative for fracture Electronically Signed   By: Franchot Gallo M.D.   On: 03/25/2020 15:27   US Abdomen Complete  Result Date: 03/25/2020 CLINICAL DATA:  Acute kidney injury and elevated LFTs. EXAM: ABDOMEN ULTRASOUND COMPLETE COMPARISON:  PET CT 02/04/2020 FINDINGS: Gallbladder: Physiologically distended. Layering sludge. No gallstones or wall thickening visualized. No sonographic Murphy sign noted by sonographer. Common bile duct: Diameter: 3 mm, normal. Liver: No focal lesion identified. Within normal limits in parenchymal echogenicity. Portal vein is patent on color Doppler imaging with normal direction of blood flow towards the liver. IVC: No abnormality visualized. Pancreas: Visualized portion unremarkable. Spleen: Size and appearance within normal limits. Right Kidney: Length: 10.0 cm. Echogenicity within normal limits. No hydronephrosis. 1.5 cm cyst in the  lower kidney. No solid mass or visualized renal calculi. Left Kidney: Length: 8.3 cm. Slight increased renal parenchymal echogenicity. No hydronephrosis. 1.3 cm cyst in the medial kidney. No solid lesion or visualized renal calculi. Abdominal aorta: No aneurysm visualized.  Atheromatous plaque. Other findings: No abdominal ascites. There is a left pleural effusion. IMPRESSION: 1. Gallbladder sludge. No gallstones or findings of acute cholecystitis. 2. No explanation for elevated LFTs.  No biliary dilatation. 3. No hydronephrosis or obstructive uropathy. Mild left renal atrophy and increased echogenicity. Incidental small bilateral renal cysts. 4. Left pleural effusion. Electronically Signed   By: Keith Rake M.D.   On: 03/25/2020 20:02   DG Chest Port 1 View  Result Date: 03/27/2020 CLINICAL DATA:  Chest pain and shortness of breath, diminished breath sounds by report. EXAM: PORTABLE CHEST 1 VIEW COMPARISON:  Multiple prior studies dating back to 2010, most recent March 25, 2020 FINDINGS: Accounting for differences in technique no interval change in the appearance of interstitial and airspace disease in the LEFT upper chest compared to the recent comparison study. Signs of emphysema in the background as before. Pleural and parenchymal scarring at the RIGHT lung base unchanged. Cardiomediastinal contours and hilar structures are stable with partially obscured LEFT hilar structures due to interstitial and airspace opacities. On limited assessment no acute skeletal process. IMPRESSION: No interval change in the appearance of the chest compared to the recent comparison study. Signs of emphysema with basilar scarring RIGHT greater than LEFT. Findings may represent infection as discussed previously. Note that this patient also has a history of neoplasm. Lymphangitic carcinomatosis is another differential consideration. Would also correlate with whether there has been radiation therapy to this area that could  also be a cause of pneumonitis. CT of the chest may be helpful for further evaluation. Electronically Signed   By: Zetta Bills M.D.   On: 03/27/2020 15:24   DG Chest Port 1 View  Result Date: 03/25/2020 CLINICAL DATA:  Fall EXAM: PORTABLE CHEST 1 VIEW COMPARISON:  06/10/2008 FINDINGS: Extensive airspace disease left upper lobe consistent with pneumonia. Underlying emphysema. Blunting right costophrenic angle unchanged compatible with effusion. Right lung otherwise clear Heart size and vascularity normal. Atherosclerotic calcification aortic arch. IMPRESSION: Extensive infiltrate left upper lobe consistent with pneumonia. Possible TB. Small right pleural effusion. Electronically Signed   By: Juanda Crumble  Carlis Abbott M.D.   On: 03/25/2020 15:29   DG Hip Unilat With Pelvis 2-3 Views Left  Result Date: 03/25/2020 CLINICAL DATA:  Fall.  History of lung cancer EXAM: DG HIP (WITH OR WITHOUT PELVIS) 2-3V LEFT COMPARISON:  None. FINDINGS: There is no evidence of hip fracture or dislocation. There is no evidence of arthropathy or other focal bone abnormality. IMPRESSION: Negative. Electronically Signed   By: Franchot Gallo M.D.   On: 03/25/2020 15:26   DG Hip Unilat With Pelvis 2-3 Views Right  Result Date: 03/25/2020 CLINICAL DATA:  Fall.  Hip pain.  Lung cancer history EXAM: DG HIP (WITH OR WITHOUT PELVIS) 2-3V RIGHT COMPARISON:  None. FINDINGS: There is no evidence of hip fracture or dislocation. There is no evidence of arthropathy or other focal bone abnormality. IMPRESSION: Negative. Electronically Signed   By: Franchot Gallo M.D.   On: 03/25/2020 15:26   VAS [place] LOWER EXTREMITY VENOUS (DVT)  Result Date: 03/26/2020  Lower Venous DVT Study Indications: Pain.  Risk Factors: None identified. Comparison Study: No prior studies. Performing Technologist: Oliver Hum RVT  Examination Guidelines: A complete evaluation includes B-mode imaging, spectral Doppler, color Doppler, and power Doppler as needed of all accessible  portions of each vessel. Bilateral testing is considered an integral part of a complete examination. Limited examinations for reoccurring indications may be performed as noted. The reflux portion of the exam is performed with the patient in reverse Trendelenburg.  +---------+---------------+---------+-----------+----------+--------------+ RIGHT    CompressibilityPhasicitySpontaneityPropertiesThrombus Aging +---------+---------------+---------+-----------+----------+--------------+ CFV      None           No       No                   Acute          +---------+---------------+---------+-----------+----------+--------------+ SFJ      Full                                                        +---------+---------------+---------+-----------+----------+--------------+ FV Prox  None           No       No                   Acute          +---------+---------------+---------+-----------+----------+--------------+ FV Mid   None           No       No                   Acute          +---------+---------------+---------+-----------+----------+--------------+ FV DistalNone           No       No                   Acute          +---------+---------------+---------+-----------+----------+--------------+ PFV      None           No       No                   Acute          +---------+---------------+---------+-----------+----------+--------------+ POP      None           No  No                   Acute          +---------+---------------+---------+-----------+----------+--------------+ PTV      None                                         Acute          +---------+---------------+---------+-----------+----------+--------------+ PERO     Partial                                      Acute          +---------+---------------+---------+-----------+----------+--------------+ Gastroc  Full                                                         +---------+---------------+---------+-----------+----------+--------------+ EIV                     Yes      Yes                                 +---------+---------------+---------+-----------+----------+--------------+ CIV                     Yes      Yes                                 +---------+---------------+---------+-----------+----------+--------------+   +---------+---------------+---------+-----------+----------+--------------+ LEFT     CompressibilityPhasicitySpontaneityPropertiesThrombus Aging +---------+---------------+---------+-----------+----------+--------------+ CFV      Full           Yes      Yes                                 +---------+---------------+---------+-----------+----------+--------------+ SFJ      Full                                                        +---------+---------------+---------+-----------+----------+--------------+ FV Prox  Full                                                        +---------+---------------+---------+-----------+----------+--------------+ FV Mid   Full                                                        +---------+---------------+---------+-----------+----------+--------------+ FV DistalFull                                                        +---------+---------------+---------+-----------+----------+--------------+  PFV      Full                                                        +---------+---------------+---------+-----------+----------+--------------+ POP      None           No       No                   Acute          +---------+---------------+---------+-----------+----------+--------------+ PTV      None                                         Acute          +---------+---------------+---------+-----------+----------+--------------+ PERO     Partial                                      Acute           +---------+---------------+---------+-----------+----------+--------------+     Summary: RIGHT: - Findings consistent with acute deep vein thrombosis involving the right common femoral vein, right femoral vein, right proximal profunda vein, right popliteal vein, right posterior tibial veins, and right peroneal veins. - No cystic structure found in the popliteal fossa.  LEFT: - Findings consistent with acute deep vein thrombosis involving the left popliteal vein, left posterior tibial veins, and left peroneal veins. - No cystic structure found in the popliteal fossa.  *See table(s) above for measurements and observations. Electronically signed by Monica Martinez MD on 03/26/2020 at 4:07:53 PM.    Final     Microbiology: No results found for this or any previous visit (from the past 240 hour(s)).   Labs: Basic Metabolic Panel: Recent Labs  Lab 04/03/20 0512 04/03/20 0515 04/04/20 0954 04/06/20 0421  NA 139  --  138 138  K 3.2*  --  4.0 3.6  CL 101  --  100 101  CO2 25  --  25 25  GLUCOSE 94  --  113* 113*  BUN 17  --  18 14  CREATININE 0.85  --  0.91 0.87  CALCIUM 8.3*  --  8.6* 8.4*  MG  --  1.7 2.3 2.0  PHOS  --   --   --  3.2   Liver Function Tests: No results for input(s): AST, ALT, ALKPHOS, BILITOT, PROT, ALBUMIN in the last 168 hours. No results for input(s): LIPASE, AMYLASE in the last 168 hours. No results for input(s): AMMONIA in the last 168 hours. CBC: Recent Labs  Lab 04/03/20 0512 04/05/20 0300 04/06/20 0421 04/07/20 0451  WBC 9.6  --  12.7* 12.3*  HGB 9.8* 9.4* 10.0* 9.8*  HCT 31.2* 29.6* 31.8* 32.1*  MCV 77.8*  --  78.7* 80.5  PLT 134*  --  108* 110*   Cardiac Enzymes: No results for input(s): CKTOTAL, CKMB, CKMBINDEX, TROPONINI in the last 168 hours. BNP: BNP (last 3 results) No results for input(s): BNP in the last 8760 hours.  ProBNP (last 3 results) No results for input(s): PROBNP in the last 8760 hours.  CBG: Recent Labs  Lab 04/03/20 1210  04/03/20  1646 04/04/20 0758 04/04/20 1137 04/04/20 1632  GLUCAP 135* 120* 94 123* 162*       Signed:  Kayleen Memos, MD [facility] 04/09/2020, 10:48 AM

## 2020-04-09 NOTE — TOC Transition Note (Addendum)
Transition of Care [facility]) - CM/SW Discharge Note   Patient Details  Name: Amanda Rivers MRN: [id number] Date of Birth: [birth year]  Transition of Care [facility]) CM/SW Contact:  Ross Ludwig, LCSW Phone Number: 04/09/2020, 6:23 PM   Clinical Narrative:     Patient has been accepted to [facility], [place], [place] [address]. [phone].  Discharge summary was faxed to SNF today.  Patient needed transportation to get to SNF, since location is over 50 miles insurance has to [facility] transport.  Patient is able to sit in a wheelchair, however she will need oxygen.  CSW spoke to [facility], they were able to provide an oxygen tank for patient to go to SNF.  Patient was able to get picked up via [facility] safe ride transportation via wheelchair van.  Patient does have her own wheelchair.  CSW contacted SNF and spoke to [facility] [phone], she stated they are ready to accept patient.  Patient discharging to SNF today, CSW provided [employer] contact information to arrange for pick up of oxygen tank once patient arrives at [facility].  CSW signing off, no other anticipated needs.  Patient's daughter was notified by bedside nurse that patient is discharging today.    Final next level of care: Skilled Nursing Facility Barriers to Discharge: Barriers Resolved   Patient Goals and CMS Choice Patient states their goals for this hospitalization and ongoing recovery are:: To go to SNF for rehab then return back home. CMS Medicare.gov Compare Post Acute Care list provided to:: Patient Represenative (must comment) Choice offered to / list presented to : Adult Children  Discharge Placement PASRR number recieved: 03/30/20            Patient chooses bed at: Other - please specify in the comment section below: ([facility] [place], [place],  [address]. [phone]) Patient to be transferred to facility by: [facility] services  safe ride. Name of family member notified: Daughter Arrie Aran [phone] Patient and family notified of of transfer: 04/09/20  Discharge Plan and Services                                     Social Determinants of Health (SDOH) Interventions     Readmission Risk Interventions No flowsheet data found.

## 2020-04-09 NOTE — Progress Notes (Signed)
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  Brought in due to failure to thrive.  Patient was found to have pneumonia and acute DVT    PT Comments    Pt presents supine, requesting to get into chair with therapy. Pt slow to mobilize over to EOB, mod A for BLE and trunk uprighting with HOB elevated. Pt requires min G to occasional min A for sitting EOB due to fatiguing and leaning R while seated EOB. Pt able to power up with min A with elevated bed, mod A to power up from chair. Pt limited with steps in room using RW, requests to sit due to fatigue. Pt appears stiff with mobility, limited movement at all joints but no facial expression of pain. Pt on 2L O2 with SpO2 95-96%, trialed RA with desat to 84% so returned 2L and able to recover within 1 minute of pursed lip breathing. Pt appears flat throughout session with minimal conversation, able to state name and DOB appropriately, but disoriented regarding location, personal age and unable to state date or president. Pt tolerates remaining up in chair with call bell in lap, BLE elevated and warm blanket donned. Pt fatigued easily during session while seated EOB and with ambulation, requiring rest breaks and cues for upright posture. Pt would continue to benefit from acute PT services and recommendations below to increase strength, balance, endurance for safe ADLs and gait.    Follow Up Recommendations  SNF     Equipment Recommendations  None recommended by PT    Recommendations for Other Services       Precautions / Restrictions Precautions Precautions: Fall Precaution Comments: monitor O2 and HR Restrictions Weight Bearing Restrictions: No    Mobility  Bed Mobility Overal bed mobility: Needs Assistance Bed Mobility: Supine to Sit  Supine to sit:  Mod assist;HOB elevated  General bed mobility comments: slow mobility with increased time, stiff bil knee movement noted while bringing LE over to EOB, assist to sit trunk up at EOB    Transfers Overall transfer level: Needs assistance Equipment used: Rolling walker (2 wheeled)  Sit to Stand: Min assist;Mod assist  General transfer comment: min A from elevated bed and mod A from low seated chair, cues for hand placement to rise and to return to chair for controlled lowering back to chair, assist for lowering due to poor eccentric control  Ambulation/Gait Ambulation/Gait assistance: Min assist Gait Distance (Feet): 5 Feet Assistive device: Rolling walker (2 wheeled) Gait Pattern/deviations: Step-through pattern;Decreased stride length;Trunk flexed Gait velocity: decreased   General Gait Details: short, shuffling steps at bedside, minimal weight-shiting laterally and stiff-like movement, trunk slightly flexed forward with BUE supported on RW, limited by fatigue requested to sit down, mild SOB noted, no overt LOB   Stairs             Wheelchair Mobility    Modified Rankin (Stroke Patients Only)       Balance Overall balance assessment: Needs assistance Sitting-balance support: Feet supported Sitting balance-Leahy Scale: Fair Sitting balance - Comments: min G with occasional min A to sit EOB, with fatigue leans towards R Postural control: Right lateral lean Standing balance support: During functional activity;Bilateral upper extremity supported Standing balance-Leahy Scale: Poor Standing balance comment: reliant on UE support           Cognition  Arousal/Alertness: Awake/alert Behavior During Therapy: Flat affect Overall Cognitive Status: No family/caregiver present to determine baseline cognitive functioning  General Comments: Pt pleasant, requires increased time and cues to follow commands, unsure if [facility] or cognition. 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      Exercises      General Comments General comments (skin integrity, edema, etc.): Pt on 2L O2 with SpO2 95-96% with mobility, trialed on RA with pt desat to 84% so returned to 2L O2 and able to recover to 90% within 1 minute of seated rest break      Pertinent Vitals/Pain Pain Assessment: No/denies pain    Home Living                      Prior Function            PT Goals (current goals can now be found in the care plan section) Acute Rehab PT Goals Patient Stated Goal: "get out of bed" PT Goal Formulation: With patient Time For Goal Achievement: 04/12/20 Potential to Achieve Goals: Fair Progress towards PT goals: Progressing toward goals    Frequency    Min 2X/week      PT Plan Current plan remains appropriate    Co-evaluation              AM-PAC PT "6 Clicks" Mobility   Outcome Measure  Help needed turning from your back to your side while in a flat bed without using bedrails?: A Little Help needed moving from lying on your back to sitting on the side of a flat bed without using bedrails?: A Little Help needed moving to and from a bed to a chair (including a wheelchair)?: A Lot Help needed standing up from a chair using your arms (e.g., wheelchair or bedside chair)?: A Lot Help needed to walk in hospital room?: A Lot Help needed climbing 3-5 steps with a railing? : A Lot 6 Click Score: 14    End of Session Equipment Utilized During Treatment: Gait belt Activity Tolerance: Patient limited by fatigue Patient left: in chair;with call bell/phone within reach;with chair alarm set Nurse Communication: Mobility status PT Visit Diagnosis: Other abnormalities of gait and mobility (R26.89)     Time: 6803-2122 PT Time Calculation (min) (ACUTE ONLY): 36  min  Charges:  $Therapeutic Activity: 23-37 mins                      Tori Ott Zimmerle PT, DPT 04/09/20, 1:16 PM

## 2020-04-09 NOTE — TOC Progression Note (Signed)
Transition of Care [facility]) - Progression Note    Patient Details  Name: Amanda Rivers MRN: [id number] Date of Birth: Sep 21, 1945  Transition [facility] Care [facility]) CM/SW Contact  Purcell Mouton, RN Phone Number: 04/09/2020, 12:12 PM  Clinical Narrative:    Spoke with daughter [name] concerning discharge, transportation to [place]. Pt is now on Oxygen, hospital felt it would be better to transport via [facility] ([facility]). [name] states, "Send my mother on by wheel chair".  Witness to this call with Ieisha Gao, RN, Baxter Flattery, RN, and  Tobin Chad, Air traffic controller of [phone].     Expected Discharge Plan: (P) Skilled Nursing Facility Barriers to Discharge: (P) Continued Medical Work up  Expected Discharge Plan and Services Expected Discharge Plan: (P) [place]       Living arrangements for the past 2 months: Single Family Home Expected Discharge Date: 04/09/20                                     Social Determinants of Health (SDOH) Interventions    Readmission Risk Interventions No flowsheet data found.

## 2020-04-09 NOTE — Plan of Care (Signed)
  Problem: Health Behavior/Discharge Planning: Goal: Ability to manage health-related needs will improve Outcome: Progressing   Problem: Clinical Measurements: Goal: Will remain free from infection Outcome: Progressing   Problem: Nutrition: Goal: Adequate nutrition will be maintained Outcome: Progressing   Problem: Safety: Goal: Ability to remain free from injury will improve Outcome: Progressing

## 2020-04-09 NOTE — TOC Progression Note (Signed)
Transition of Care [facility]) - Progression Note    Patient Details  Name: Clorine Swing MRN: [id number] Date of Birth: 02/07/1946  Transition of Care [facility]) CM/SW Contact  Purcell Mouton, RN Phone Number: 04/09/2020, 12:21 PM  Clinical Narrative:     [address], [place], [place] [address]. [phone].   Expected Discharge Plan: (P) Skilled Nursing Facility Barriers to Discharge: (P) Continued Medical Work up  Expected Discharge Plan and Services Expected Discharge Plan: (P) [place]       Living arrangements for the past 2 months: Single Family Home Expected Discharge Date: 04/09/20                                     Social Determinants of Health (SDOH) Interventions    Readmission Risk Interventions No flowsheet data found.

## 2020-04-12 DIAGNOSIS — I82403 Acute embolism and thrombosis of unspecified deep veins of lower extremity, bilateral: Secondary | ICD-10-CM | POA: Diagnosis not present

## 2020-04-12 DIAGNOSIS — C4492 Squamous cell carcinoma of skin, unspecified: Secondary | ICD-10-CM | POA: Diagnosis not present

## 2020-04-12 DIAGNOSIS — G9341 Metabolic encephalopathy: Secondary | ICD-10-CM | POA: Diagnosis not present

## 2020-04-12 DIAGNOSIS — J9601 Acute respiratory failure with hypoxia: Secondary | ICD-10-CM | POA: Diagnosis not present

## 2020-04-12 DIAGNOSIS — N179 Acute kidney failure, unspecified: Secondary | ICD-10-CM | POA: Diagnosis not present

## 2020-04-12 DIAGNOSIS — R1312 Dysphagia, oropharyngeal phase: Secondary | ICD-10-CM | POA: Diagnosis not present

## 2020-04-12 DIAGNOSIS — R627 Adult failure to thrive: Secondary | ICD-10-CM | POA: Diagnosis not present

## 2020-04-12 DIAGNOSIS — J189 Pneumonia, unspecified organism: Secondary | ICD-10-CM | POA: Diagnosis not present

## 2020-04-13 DIAGNOSIS — D696 Thrombocytopenia, unspecified: Secondary | ICD-10-CM | POA: Diagnosis not present

## 2020-04-13 DIAGNOSIS — J181 Lobar pneumonia, unspecified organism: Secondary | ICD-10-CM | POA: Diagnosis not present

## 2020-04-13 DIAGNOSIS — C7800 Secondary malignant neoplasm of unspecified lung: Secondary | ICD-10-CM | POA: Diagnosis not present

## 2020-04-13 DIAGNOSIS — R627 Adult failure to thrive: Secondary | ICD-10-CM | POA: Diagnosis not present

## 2020-04-13 DIAGNOSIS — C4442 Squamous cell carcinoma of skin of scalp and neck: Secondary | ICD-10-CM | POA: Diagnosis not present

## 2020-04-13 DIAGNOSIS — R0602 Shortness of breath: Secondary | ICD-10-CM | POA: Diagnosis not present

## 2020-04-13 DIAGNOSIS — Z7189 Other specified counseling: Secondary | ICD-10-CM | POA: Diagnosis not present

## 2020-04-13 DIAGNOSIS — C4492 Squamous cell carcinoma of skin, unspecified: Secondary | ICD-10-CM | POA: Diagnosis not present

## 2020-04-13 DIAGNOSIS — D631 Anemia in chronic kidney disease: Secondary | ICD-10-CM | POA: Diagnosis not present

## 2020-04-14 DIAGNOSIS — R54 Age-related physical debility: Secondary | ICD-10-CM | POA: Diagnosis not present

## 2020-04-14 DIAGNOSIS — R918 Other nonspecific abnormal finding of lung field: Secondary | ICD-10-CM | POA: Diagnosis not present

## 2020-04-14 DIAGNOSIS — R0602 Shortness of breath: Secondary | ICD-10-CM | POA: Diagnosis not present

## 2020-04-14 DIAGNOSIS — Z66 Do not resuscitate: Secondary | ICD-10-CM | POA: Diagnosis not present

## 2020-04-14 DIAGNOSIS — J9602 Acute respiratory failure with hypercapnia: Secondary | ICD-10-CM | POA: Diagnosis not present

## 2020-04-14 DIAGNOSIS — Z9189 Other specified personal risk factors, not elsewhere classified: Secondary | ICD-10-CM | POA: Diagnosis not present

## 2020-04-14 DIAGNOSIS — Z86718 Personal history of other venous thrombosis and embolism: Secondary | ICD-10-CM | POA: Diagnosis not present

## 2020-04-14 DIAGNOSIS — R0603 Acute respiratory distress: Secondary | ICD-10-CM | POA: Diagnosis not present

## 2020-04-14 DIAGNOSIS — G9340 Encephalopathy, unspecified: Secondary | ICD-10-CM | POA: Diagnosis not present

## 2020-04-14 DIAGNOSIS — R0600 Dyspnea, unspecified: Secondary | ICD-10-CM | POA: Diagnosis not present

## 2020-04-14 DIAGNOSIS — C760 Malignant neoplasm of head, face and neck: Secondary | ICD-10-CM | POA: Diagnosis not present

## 2020-04-14 DIAGNOSIS — Z7409 Other reduced mobility: Secondary | ICD-10-CM | POA: Diagnosis not present

## 2020-04-14 DIAGNOSIS — N179 Acute kidney failure, unspecified: Secondary | ICD-10-CM | POA: Diagnosis not present

## 2020-04-14 DIAGNOSIS — E872 Acidosis: Secondary | ICD-10-CM | POA: Diagnosis not present

## 2020-04-14 DIAGNOSIS — Z681 Body mass index (BMI) 19 or less, adult: Secondary | ICD-10-CM | POA: Diagnosis not present

## 2020-04-14 DIAGNOSIS — Z7189 Other specified counseling: Secondary | ICD-10-CM | POA: Diagnosis not present

## 2020-04-14 DIAGNOSIS — A419 Sepsis, unspecified organism: Secondary | ICD-10-CM | POA: Diagnosis not present

## 2020-04-14 DIAGNOSIS — J9601 Acute respiratory failure with hypoxia: Secondary | ICD-10-CM | POA: Diagnosis not present

## 2020-04-14 DIAGNOSIS — Z515 Encounter for palliative care: Secondary | ICD-10-CM | POA: Diagnosis not present

## 2020-04-14 DIAGNOSIS — C159 Malignant neoplasm of esophagus, unspecified: Secondary | ICD-10-CM | POA: Diagnosis not present

## 2020-04-14 DIAGNOSIS — K117 Disturbances of salivary secretion: Secondary | ICD-10-CM | POA: Diagnosis not present

## 2020-04-14 DIAGNOSIS — Z20822 Contact with and (suspected) exposure to covid-19: Secondary | ICD-10-CM | POA: Diagnosis not present

## 2020-04-14 DIAGNOSIS — E785 Hyperlipidemia, unspecified: Secondary | ICD-10-CM | POA: Diagnosis not present

## 2020-04-14 DIAGNOSIS — J189 Pneumonia, unspecified organism: Secondary | ICD-10-CM | POA: Diagnosis not present

## 2020-04-14 DIAGNOSIS — I10 Essential (primary) hypertension: Secondary | ICD-10-CM | POA: Diagnosis not present

## 2020-04-14 DIAGNOSIS — I248 Other forms of acute ischemic heart disease: Secondary | ICD-10-CM | POA: Diagnosis not present

## 2020-04-20 DEATH — deceased

## 2020-04-21 ENCOUNTER — Ambulatory Visit: Payer: Medicare HMO | Admitting: Radiation Oncology

## 2020-05-06 ENCOUNTER — Inpatient Hospital Stay: Payer: Medicare HMO | Admitting: Hematology and Oncology

## 2020-05-12 LAB — ACID FAST CULTURE WITH REFLEXED SENSITIVITIES (MYCOBACTERIA): Acid Fast Culture: NEGATIVE

## 2020-07-12 LAB — ACID FAST SMEAR (AFB, MYCOBACTERIA)
AFB Specimen Processing: NEGATIVE
Acid Fast Smear: NEGATIVE

## 2020-08-24 IMAGING — CT CT MAXILLOFACIAL W/ CM
3 of 6 series · 15 of 47 positions shown, 18 images · IV contrast (APPLIED)
Comparison: 06/10/2008
COMPARISON: 06/10/2008

Addendum:
CLINICAL DATA: Upper tooth pain. Possible oral abscess.

EXAM:
CT MAXILLOFACIAL WITH CONTRAST
TECHNIQUE: Multidetector CT imaging of the maxillofacial structures was
performed with intravenous contrast. Multiplanar CT image
reconstructions were also generated.
CONTRAST:  75mL OMNIPAQUE IOHEXOL 300 MG/ML  SOLN

[Series 3: maxilllofacial 2.0 hr40 3 · axial · 0.33mm/px · z∈[+1188,+1326]mm · 10 of 81 slices shown, 13 images]
[im 6/81  brain]
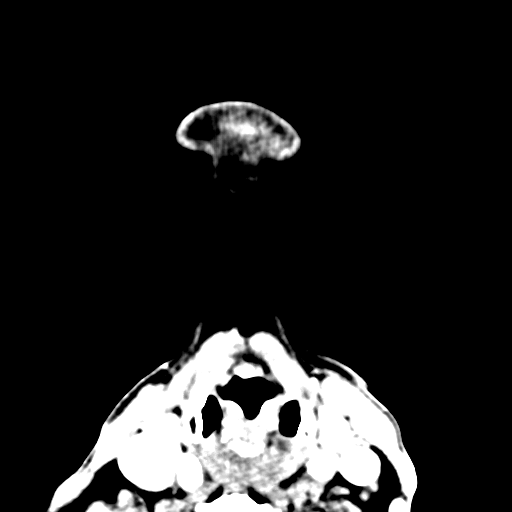
[im 6/81  bone]
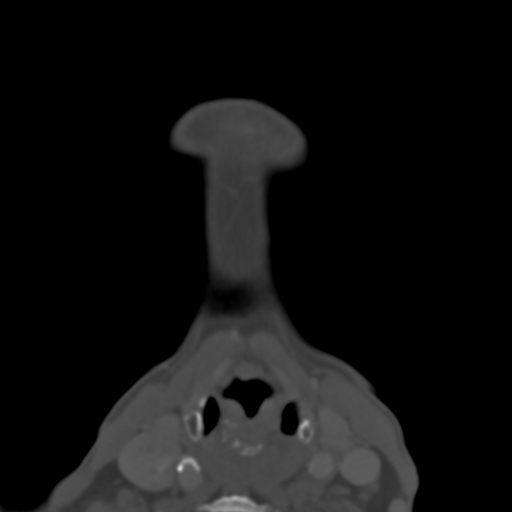
[im 12/81  bone]
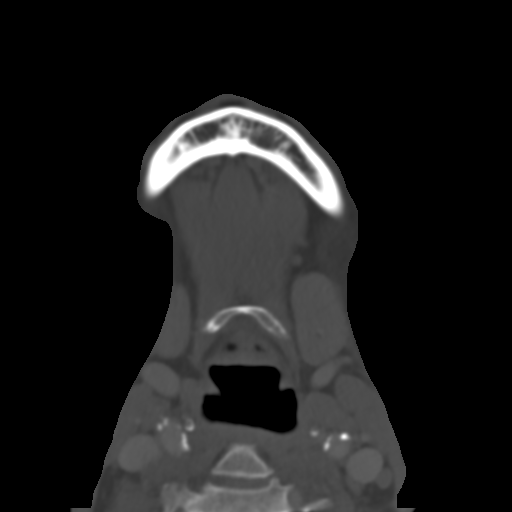
[im 23/81  bone]
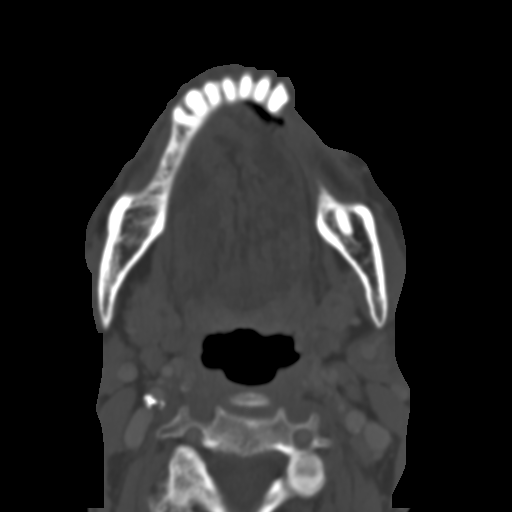
[im 29/81  bone]
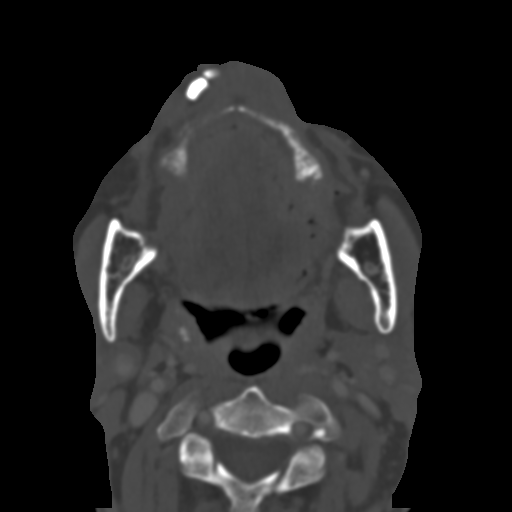
[im 35/81  brain]
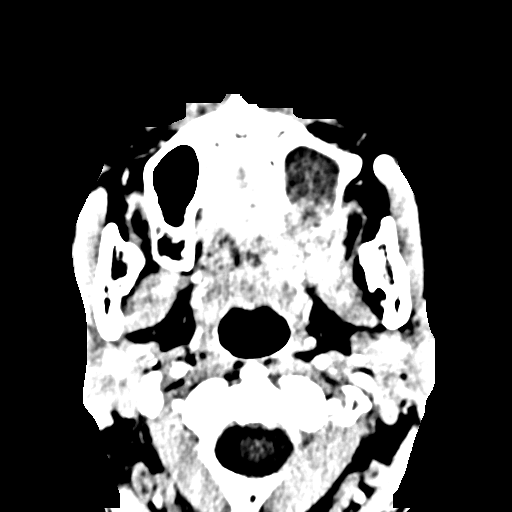
[im 35/81  bone]
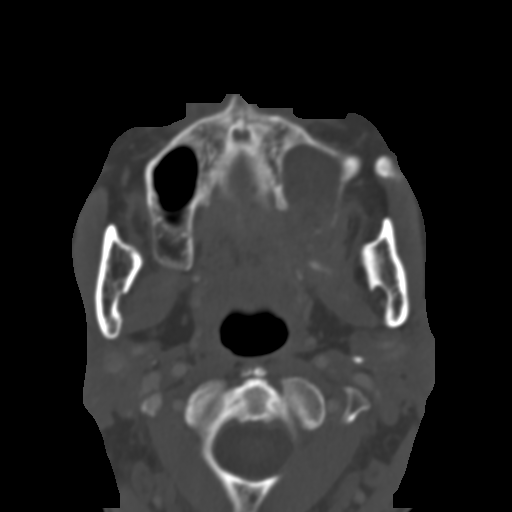
[im 46/81  bone]
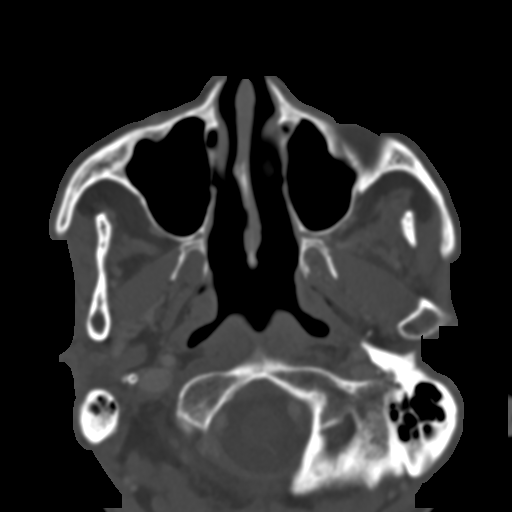
[im 52/81  bone]
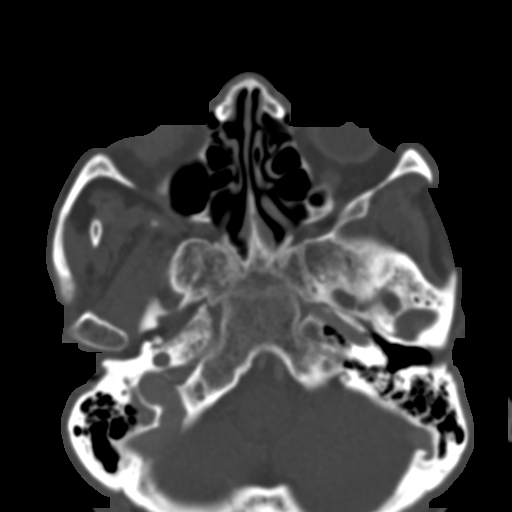
[im 58/81  bone]
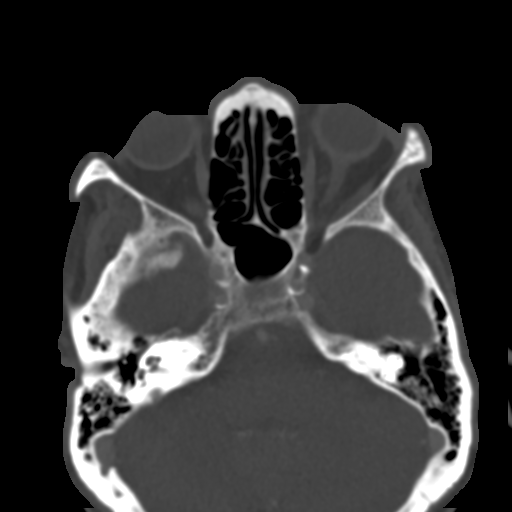
[im 69/81  brain]
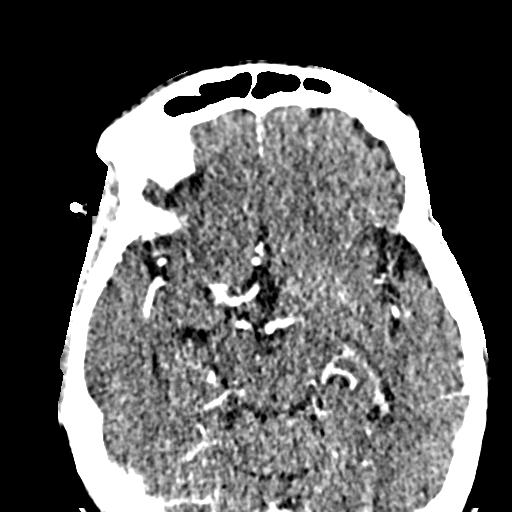
[im 69/81  bone]
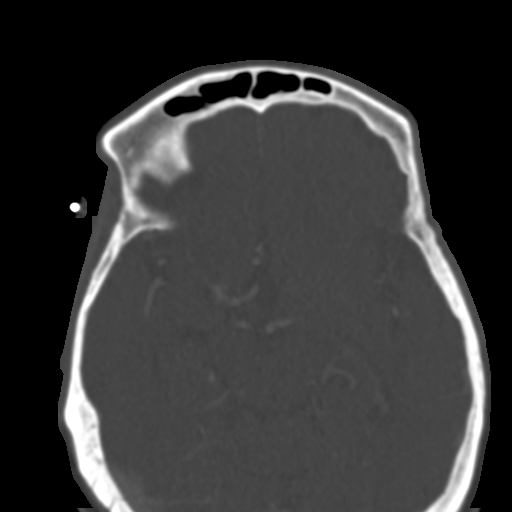
[im 75/81  bone]
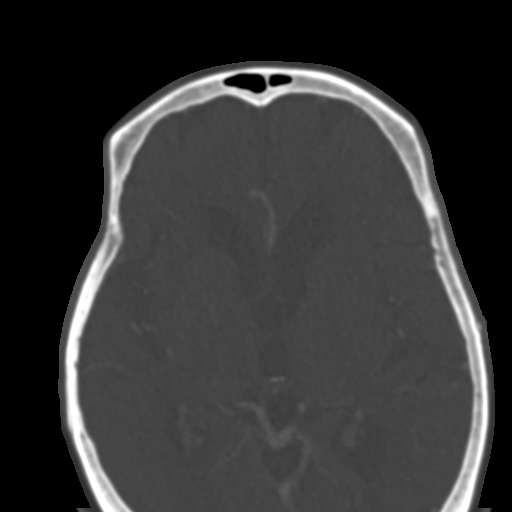

[Series 9: bone cor · coronal · 0.33mm/px · 3 of 73 slices shown]
[im 19/73  bone]
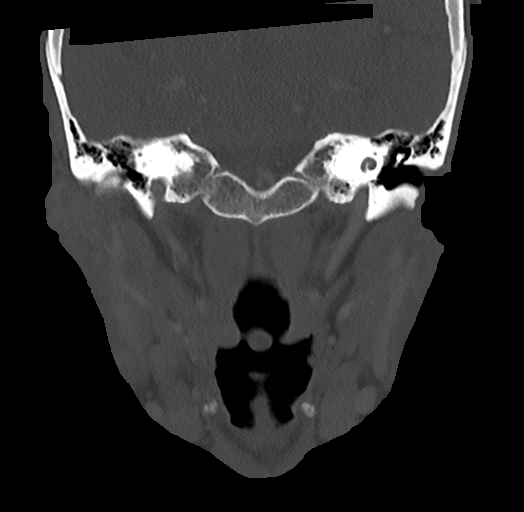
[im 37/73  bone]
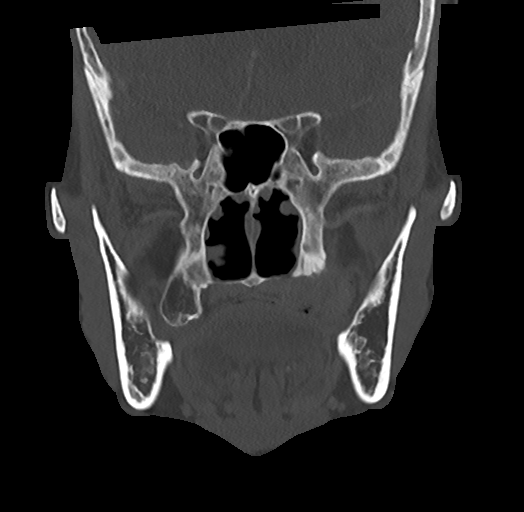
[im 55/73  bone]
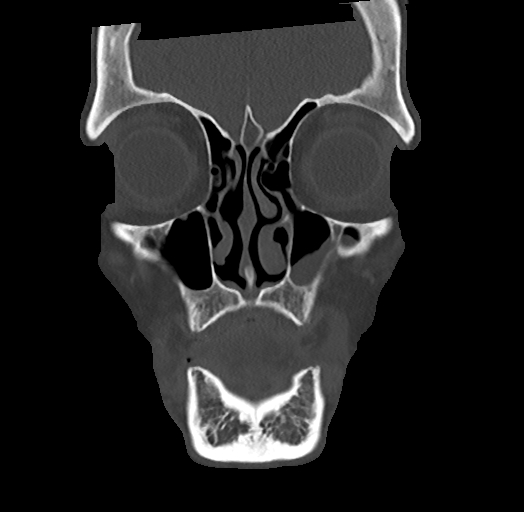

[Series 10: bone sag · sagittal · 0.31mm/px · 2 of 87 slices shown]
[im 29/87  bone]
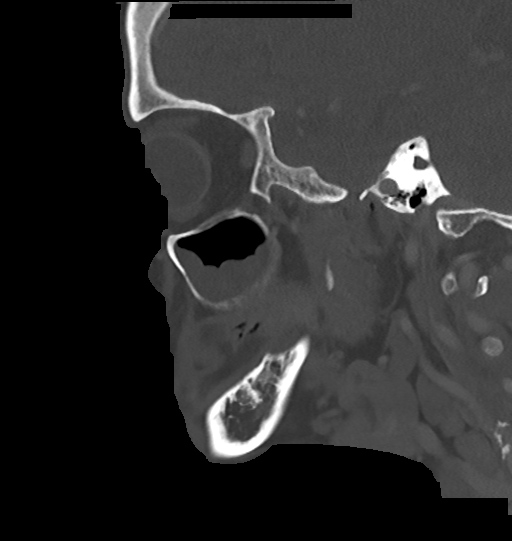
[im 58/87  bone]
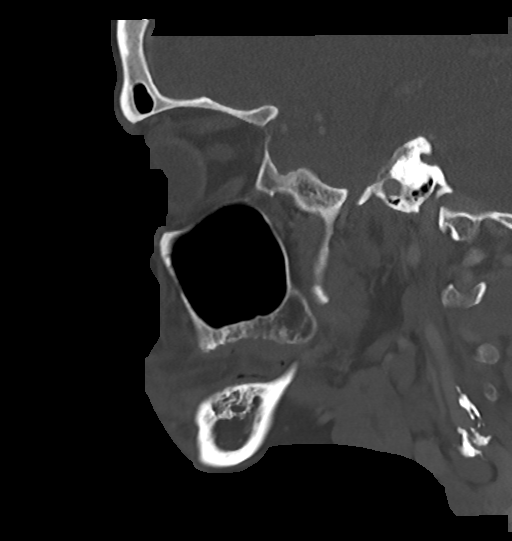

[15 of 47 positions shown; findings below may reference images not displayed]

FINDINGS: Osseous: No facial fracture.

Dental: There are no remaining maxillary teeth. There is dehiscence
of the posterior floor of the left maxillary sinus in the location
where the molar roots would normally be. There is heterogeneous soft
tissue extending through the defect. There is also overlying mucosal
thickening of the inferior left maxillary sinus.

Orbits: The globes are intact. Normal appearance of the intra- and
extraconal fat. Symmetric extraocular muscles.

Sinuses: Partial opacification of the left maxillary sinus.

Soft tissues: Normal visualized extracranial soft tissues.

Limited intracranial: Normal.
IMPRESSION: Dehiscence of the posterior floor of the left maxillary sinus with
heterogeneous soft tissue, likely phlegmon, extending through the
defect. Overlying maxillary sinus mucosal thickening.

ADDENDUM:
Additional clinical history was provided. The patient has had a
potential malignancy identified on prior oral cavity examination.
These imaging findings are also consistent with a neoplastic
process, which is more likely given this additional information.

*** End of Addendum ***
FINDINGS: Osseous: No facial fracture.

Dental: There are no remaining maxillary teeth. There is dehiscence
of the posterior floor of the left maxillary sinus in the location
where the molar roots would normally be. There is heterogeneous soft
tissue extending through the defect. There is also overlying mucosal
thickening of the inferior left maxillary sinus.

Orbits: The globes are intact. Normal appearance of the intra- and
extraconal fat. Symmetric extraocular muscles.

Sinuses: Partial opacification of the left maxillary sinus.

Soft tissues: Normal visualized extracranial soft tissues.

Limited intracranial: Normal.
IMPRESSION: Dehiscence of the posterior floor of the left maxillary sinus with
heterogeneous soft tissue, likely phlegmon, extending through the
defect. Overlying maxillary sinus mucosal thickening.

## 2020-09-15 NOTE — Progress Notes (Signed)
  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 initial tolerated RT well. She started to miss multiple treatments despite intensive navigation. [name]'s daughter [name] called our staff to inform us on her mother's status regarding continuing radiation treatment. She came into town yesterday and met with her mother in person and learned that over the past few days she had become very weak and unable to care for herself properly with several recent falls. At this time they are cancelling all her future radiation treatments here at [facility]. They are working to assist her at home and possible have her moved to a facility.The know to call our team if there is anything we can do to help them; we will followup with her PRN depending on family's ability to bring her in.  -----------------------------------  Eppie Gibson, MD

## 2021-05-23 IMAGING — PT NM PET TUM IMG INITIAL (PI) SKULL BASE T - THIGH
1 of 7 series · 1 of 25 positions shown · non-contrast
Comparison: Outside CT scan from [facility]HOSP[facility]]
dated 12/11/2019

CLINICAL DATA: Initial treatment strategy for pulmonary nodules.
History of squamous cell cancer of the head neck.

EXAM:
NUCLEAR MEDICINE PET SKULL BASE TO THIGH
TECHNIQUE: 7.74 mCi F-18 FDG was injected intravenously. Full-ring PET imaging
was performed from the skull base to thigh after the radiotracer. CT
data was obtained and used for attenuation correction and anatomic
localization.
Fasting blood glucose: 114 mg/dl

[Series 4: ct sk_thigh 5.0 bf37 · axial · 5.0mm · 0.98mm/px · 1 of 238 slices shown]
[im 238/238  brain]
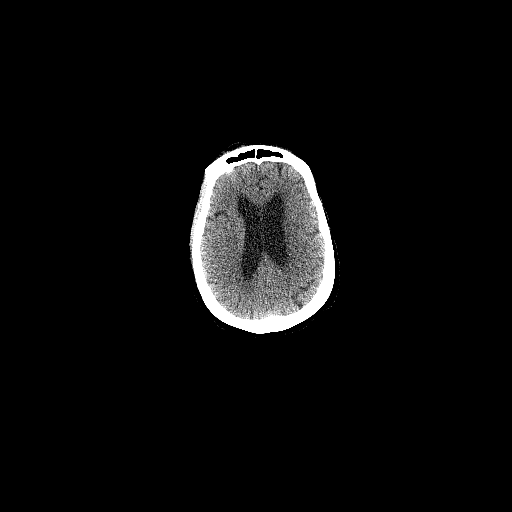

[1 of 25 positions shown; findings below may reference images not displayed]

FINDINGS: Mediastinal blood pool activity: SUV max

Liver activity: SUV max NA

NECK: No hypermetabolic lymph nodes in the neck.

Triangular area mixed attenuation in the left lower neck just above
the surgical clips could be a postoperative fluid collection but it
is hypermetabolic with SUV max of 6.42. Could not exclude necrotic
tumor.

Moderate symmetric diffuse hypermetabolism noted in the region of
the true cords and arytenoids possibly due to phonation. Radiation
changes also possible.

Incidental CT findings: Severe/advanced coronary artery
calcifications.

CHEST: Pulmonary lesions are hypermetabolic.

12.5 x 10.0 mm left apical nodule on image number [DATE] is
hypermetabolic with SUV max of 3.49.

Two ill-defined sub solid nodular lesions in the left upper lobe on
image number [DATE] are hypermetabolic. The more medial lesion has an
SUV max of 4.18 and the more lateral lesion has an SUV max of 4.85.

3.5 x 2.5 cm lesion in the left upper lobe inferiorly is
hypermetabolic with SUV max of 7.16.

No other significant pulmonary lesions. Stable surgical changes from
a probable right upper lobe lobectomy.

5.5 mm right upper lung nodule on image 87/3 is weakly
hypermetabolic with SUV max of 0.91. Given its small size is is
somewhat worrisome.

No enlarged or hypermetabolic mediastinal or hilar lymph nodes.

Incidental CT findings: Stable vascular calcifications.

ABDOMEN/PELVIS: No abnormal hypermetabolic activity within the
liver, pancreas, adrenal glands, or spleen. No hypermetabolic lymph
nodes in the abdomen or pelvis.

Incidental CT findings: Advanced vascular calcifications.

SKELETON: No significant bony findings.

Incidental CT findings: none
IMPRESSION: 1. Hypermetabolic pulmonary lesions suspicious for metastatic
disease. The most concerning is the largest lesion in the left upper
lobe adjacent to the major fissure.
2. No findings to suggest mediastinal or hilar lymphadenopathy
3. No evidence of abdominal/pelvic metastatic disease or osseous
metastatic disease.
4. Very symmetric hypermetabolism in the larynx possibly due to
radiation or phonation.
5. Complex cystic appearing right lower neck lesion adjacent to the
right lobe of the thyroid gland demonstrating hypermetabolism,
likely postoperative change and or radiation. Recommend close
observation.

## 2021-07-12 IMAGING — CR DG LUMBAR SPINE COMPLETE 4+V
5 series · 5 of 5 positions shown · non-contrast
Comparison: None.

CLINICAL DATA: Fall.

EXAM:
LUMBAR SPINE - COMPLETE 4+ VIEW

[t lumbar spine ap]
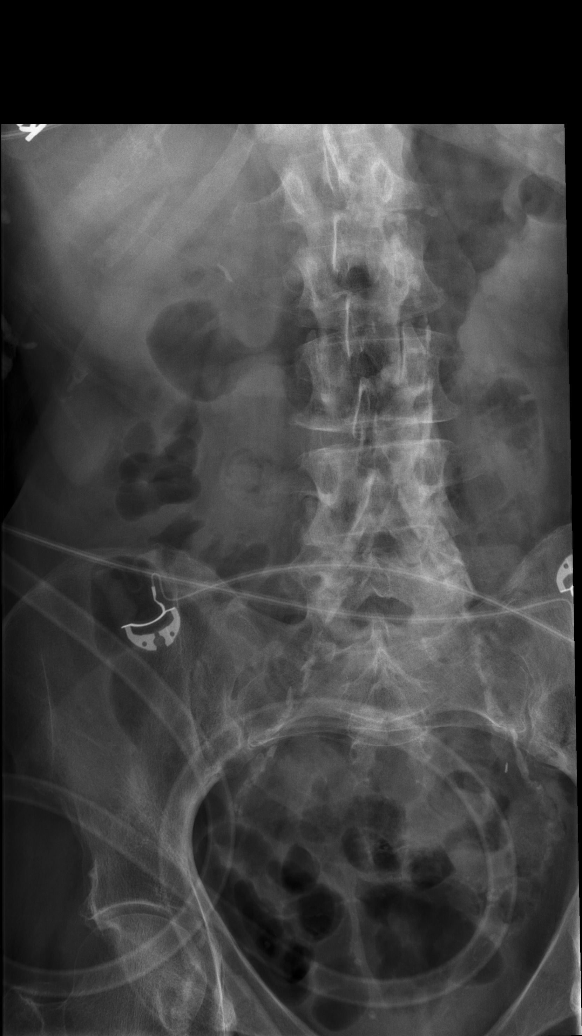

[t lumbar spine obl (1 of 3)]
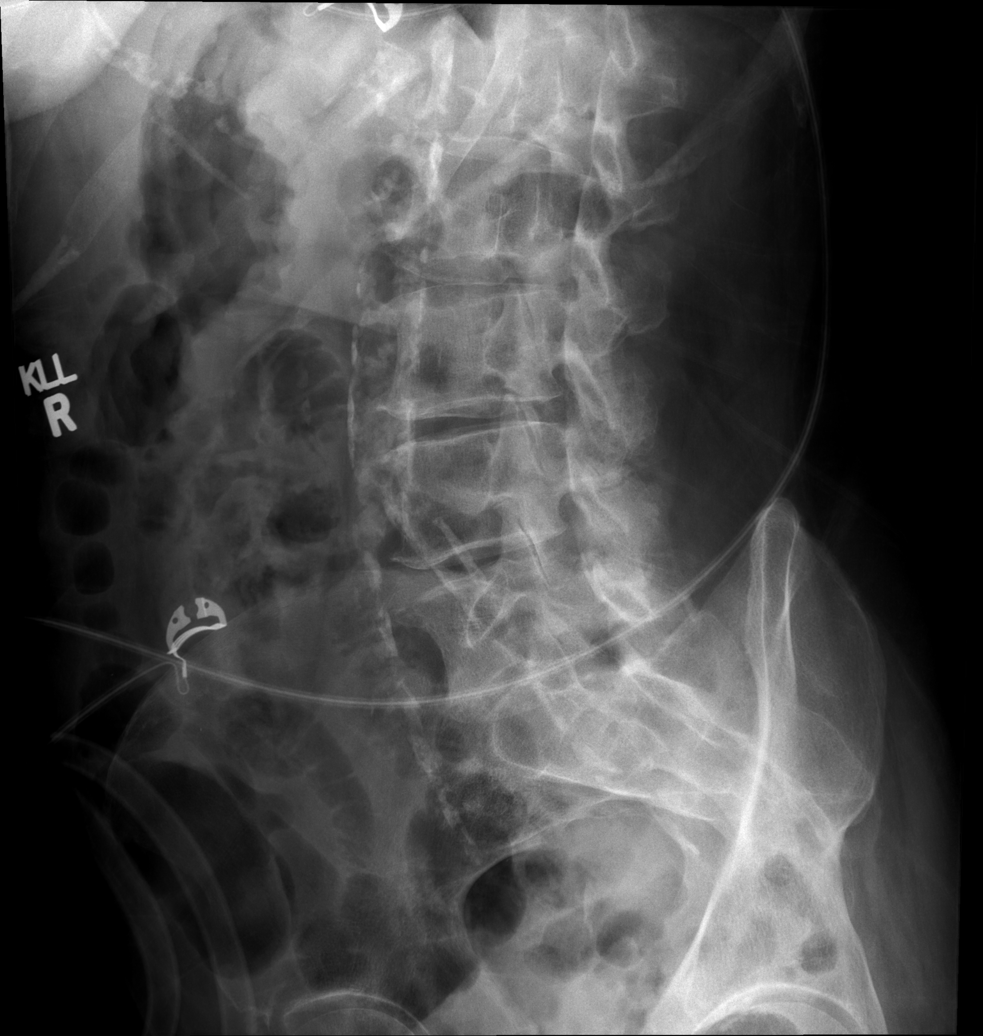

[t lumbar spine obl (2 of 3)]
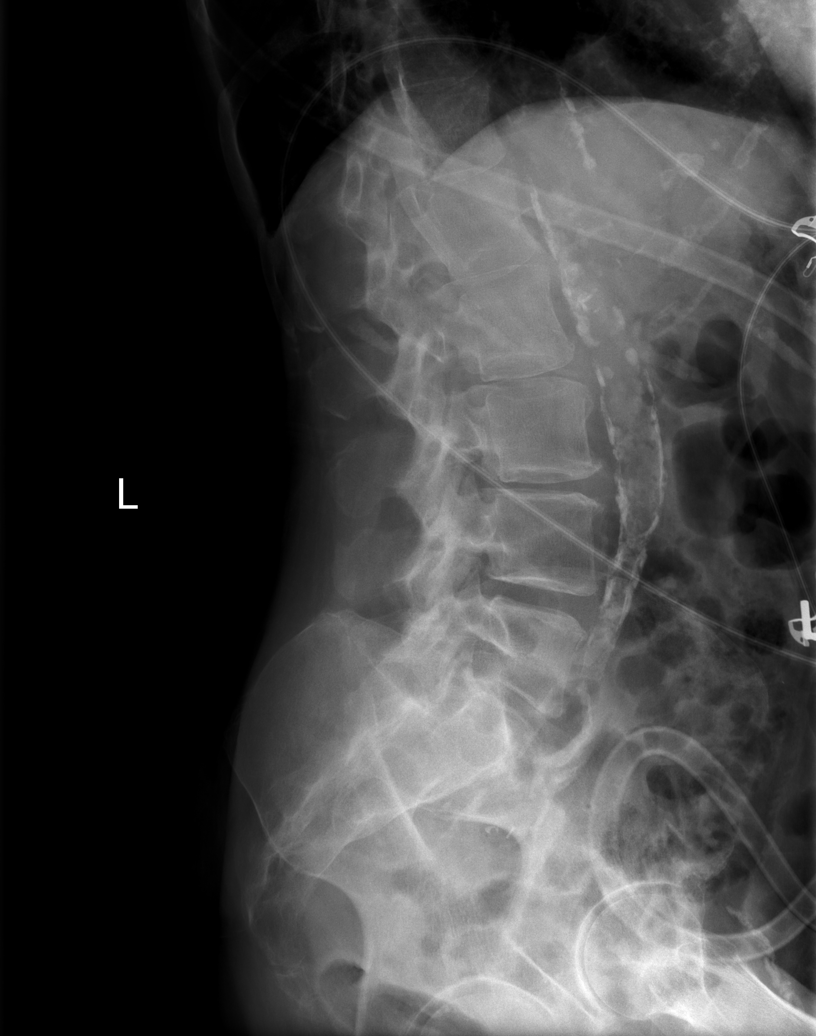

[t lumbar l-5 s-1 spot]
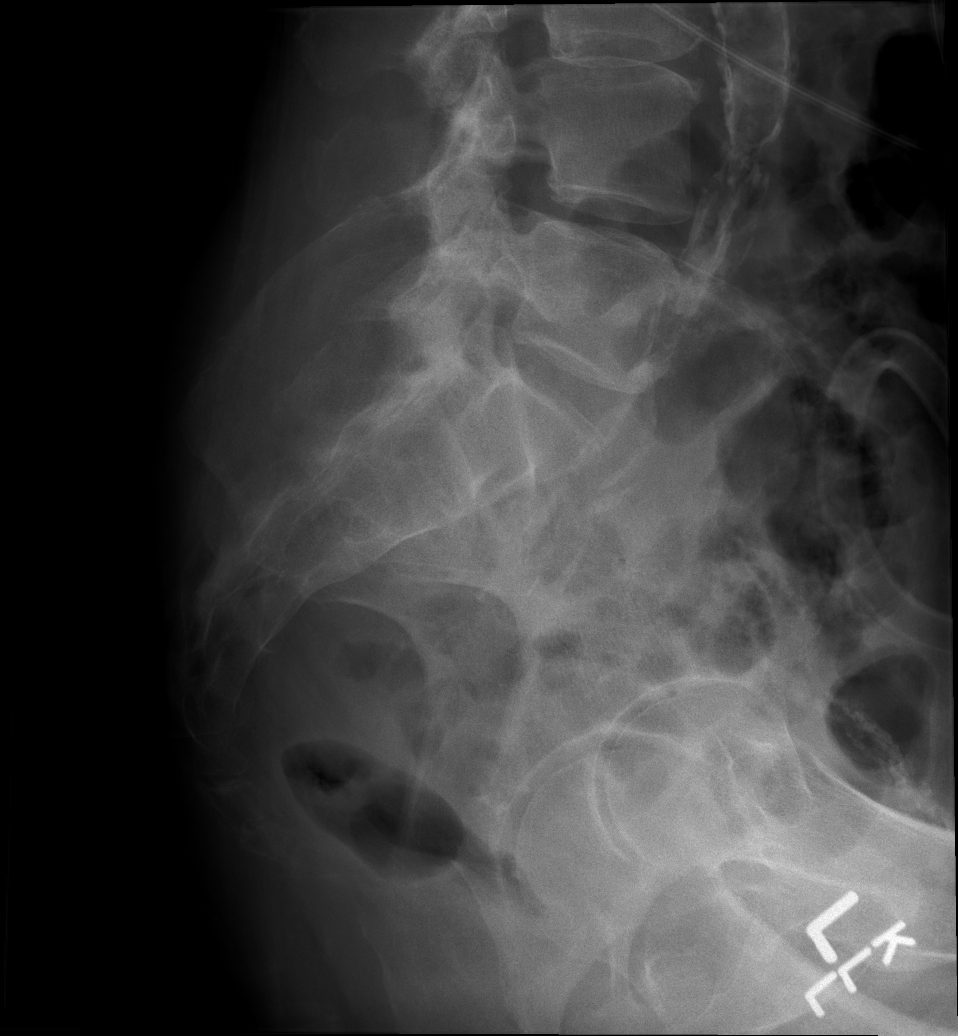

[t lumbar spine obl (3 of 3)]
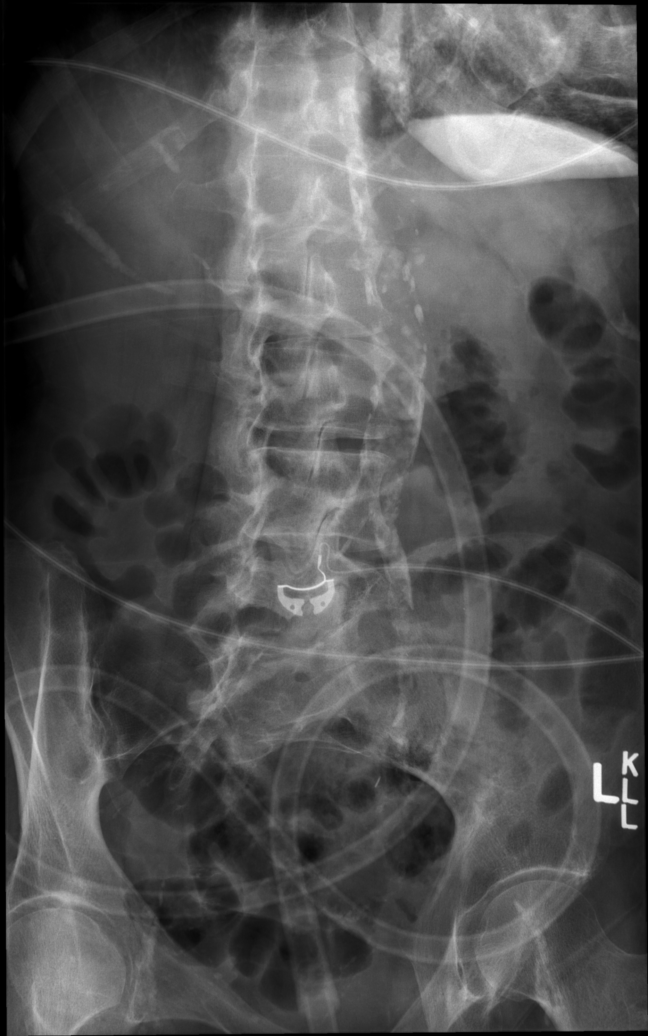

[5 of 5 positions shown; findings below may reference images not displayed]

FINDINGS: Normal alignment. Negative for fracture. Mild disc degeneration.
Facet degeneration L4-5 and L5-S1

Atherosclerotic calcification aorta and iliac arteries without
aneurysm.
IMPRESSION: Negative for fracture

## 2021-07-12 IMAGING — US US ABDOMEN COMPLETE
1 series · 13 of 25 positions shown · non-contrast
Comparison: PET CT 02/04/2020

CLINICAL DATA: Acute kidney injury and elevated LFTs.

EXAM:
ABDOMEN ULTRASOUND COMPLETE

[Series 1: us abdomen complete · 13 of 102 slices shown]
[im 1/102]
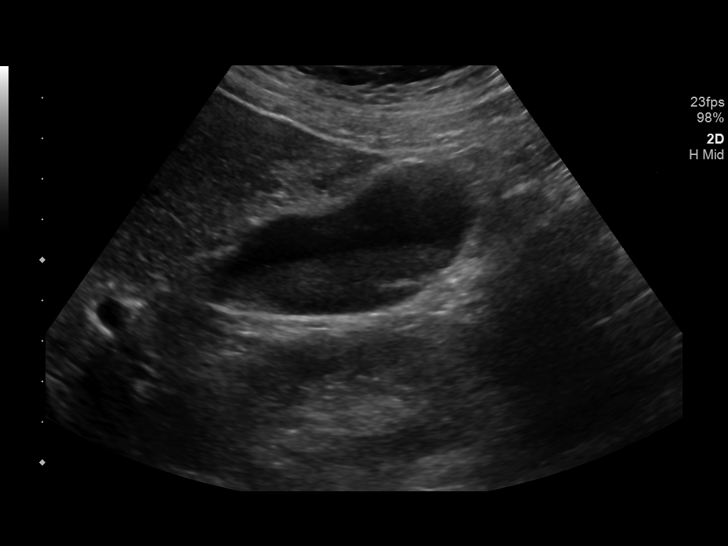
[im 9/102]
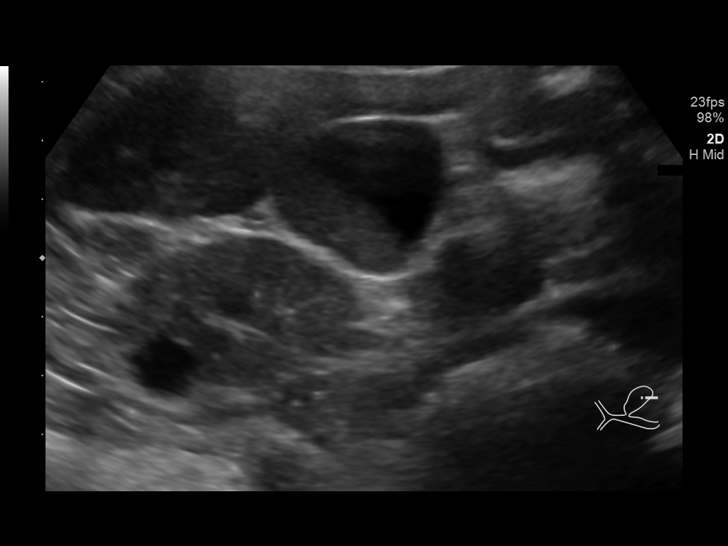
[im 17/102]
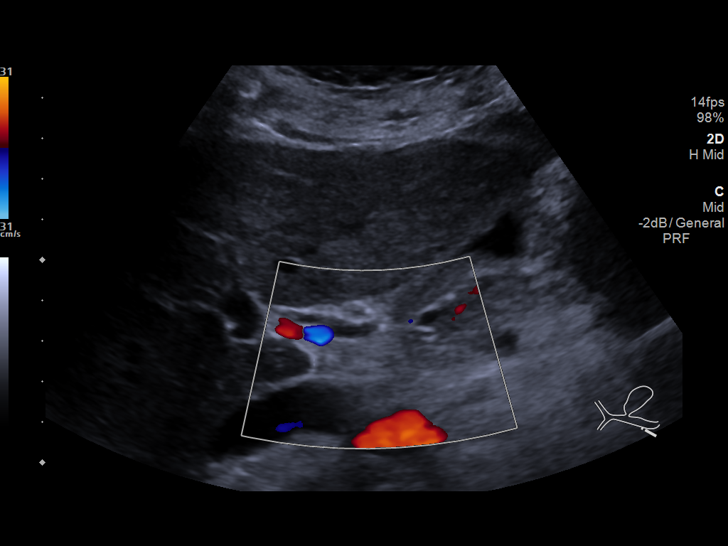
[im 26/102]
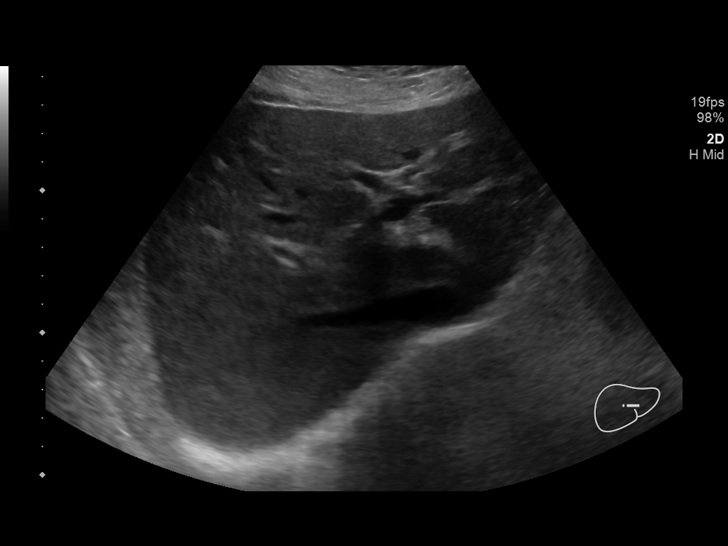
[im 34/102]
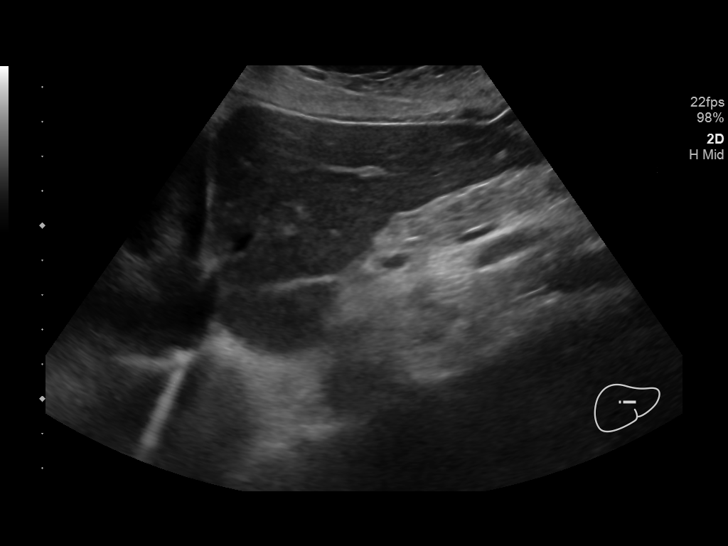
[im 43/102]
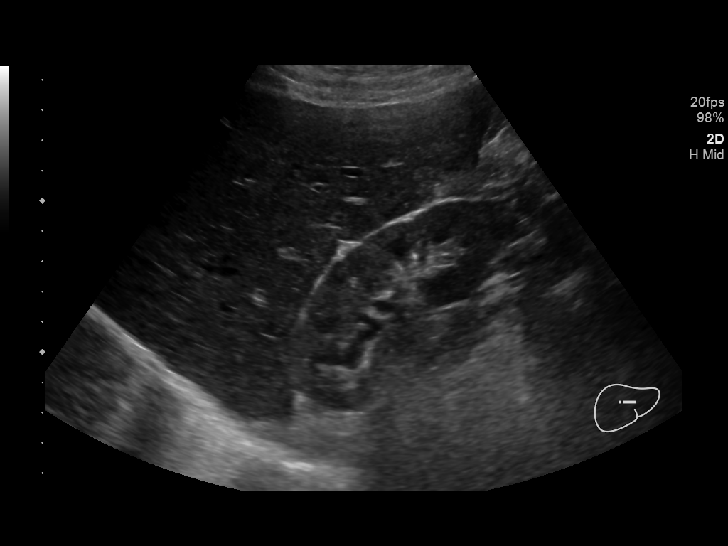
[im 51/102]
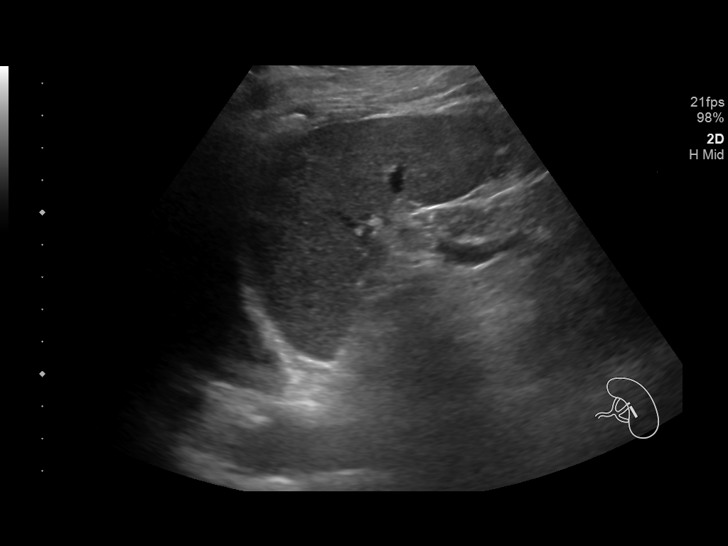
[im 59/102]
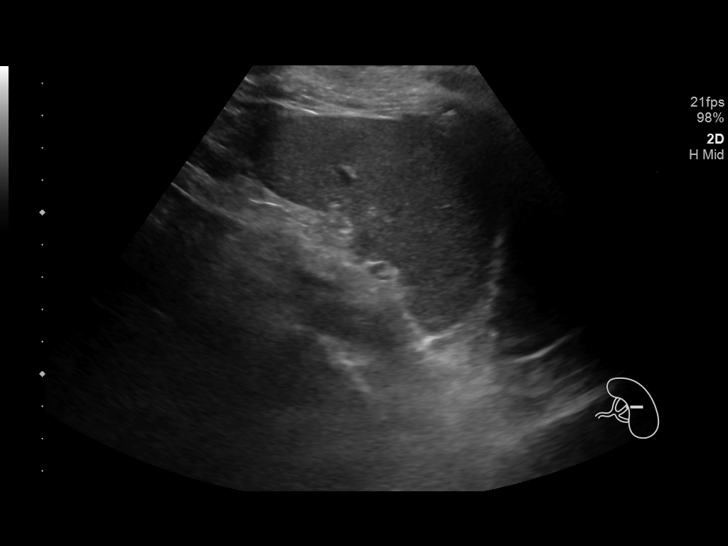
[im 68/102]
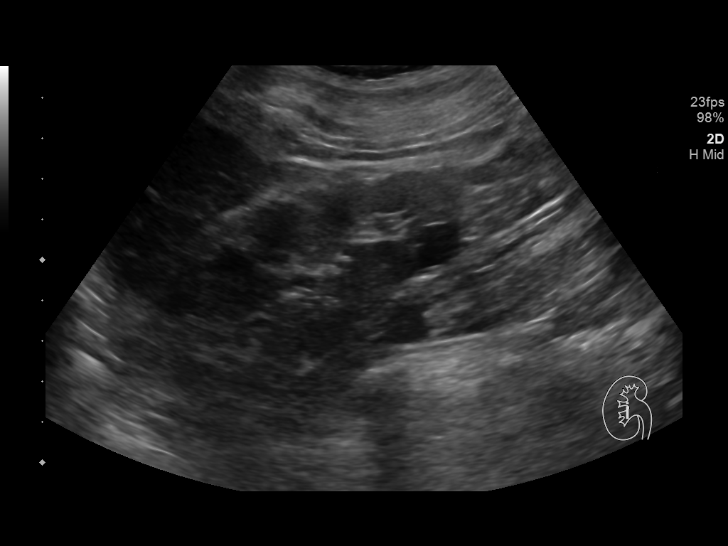
[im 76/102]
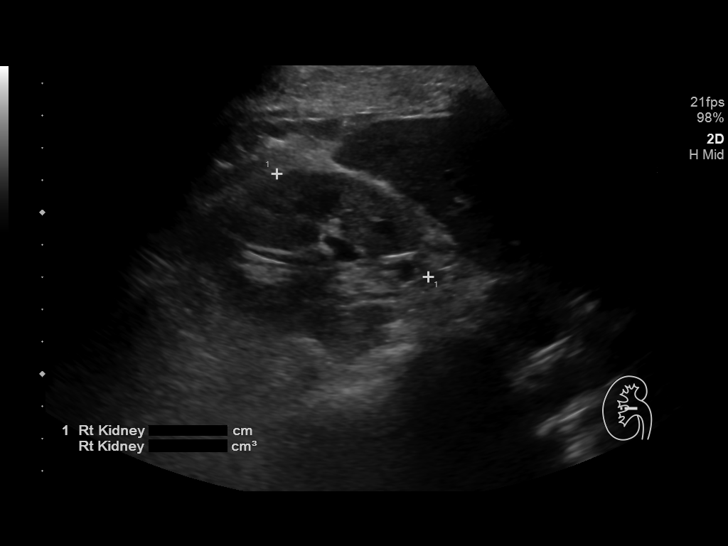
[im 85/102]
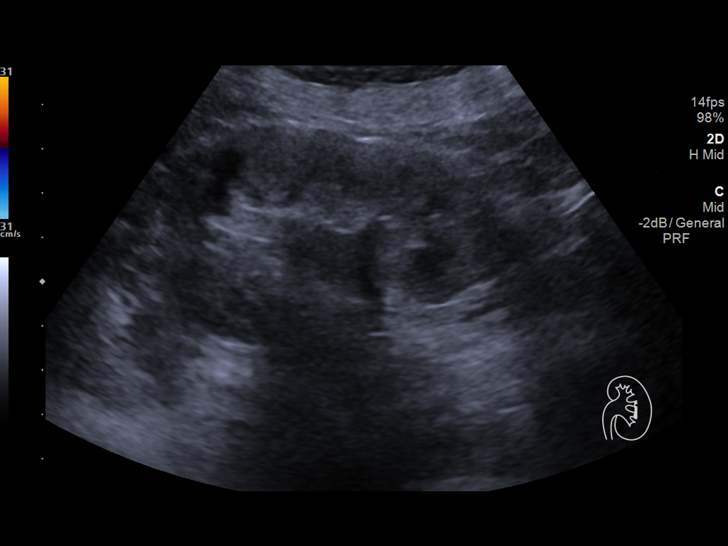
[im 93/102]
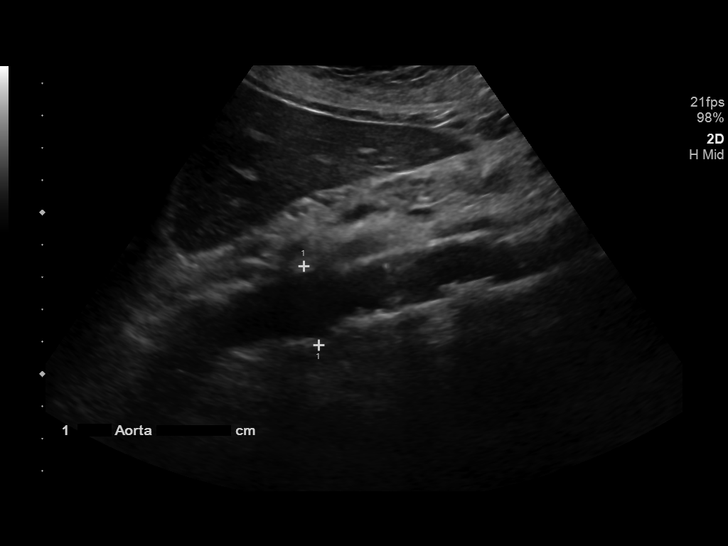
[im 102/102]
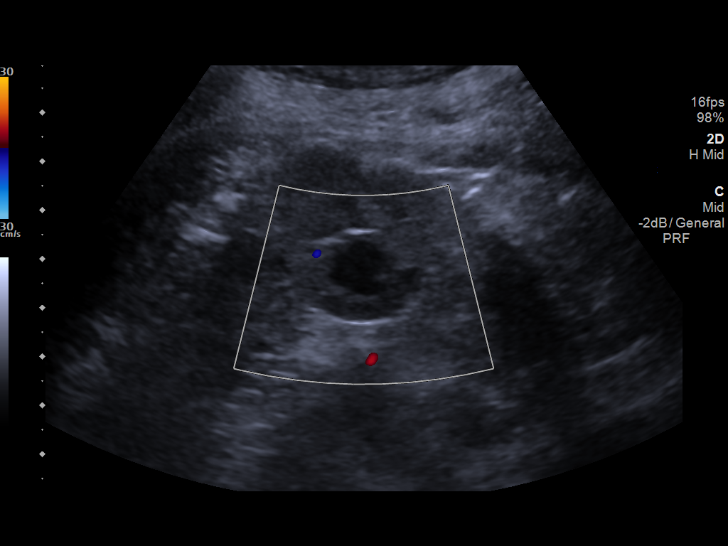

[13 of 25 positions shown; findings below may reference images not displayed]

FINDINGS: Gallbladder: Physiologically distended. Layering sludge. No
gallstones or wall thickening visualized. No sonographic Murphy sign
noted by sonographer.

Common bile duct: Diameter: 3 mm, normal.

Liver: No focal lesion identified. Within normal limits in
parenchymal echogenicity. Portal vein is patent on color Doppler
imaging with normal direction of blood flow towards the liver.

IVC: No abnormality visualized.

Pancreas: Visualized portion unremarkable.

Spleen: Size and appearance within normal limits.

Right Kidney: Length: 10.0 cm. Echogenicity within normal limits. No
hydronephrosis. 1.5 cm cyst in the lower kidney. No solid mass or
visualized renal calculi.

Left Kidney: Length: 8.3 cm. Slight increased renal parenchymal
echogenicity. No hydronephrosis. 1.3 cm cyst in the medial kidney.
No solid lesion or visualized renal calculi.

Abdominal aorta: No aneurysm visualized.  Atheromatous plaque.

Other findings: No abdominal ascites. There is a left pleural
effusion.
IMPRESSION: 1. Gallbladder sludge. No gallstones or findings of acute
cholecystitis.
2. No explanation for elevated LFTs.  No biliary dilatation.
3. No hydronephrosis or obstructive uropathy. Mild left renal
atrophy and increased echogenicity. Incidental small bilateral renal
cysts.
4. Left pleural effusion.

## 2021-07-12 IMAGING — CR DG HIP (WITH OR WITHOUT PELVIS) 2-3V*R*
2 series · 2 of 2 positions shown · non-contrast
Comparison: None.

CLINICAL DATA: Fall.  Hip pain.  Lung cancer history

EXAM:
DG HIP (WITH OR WITHOUT PELVIS) 2-3V RIGHT

[t hip ap right]
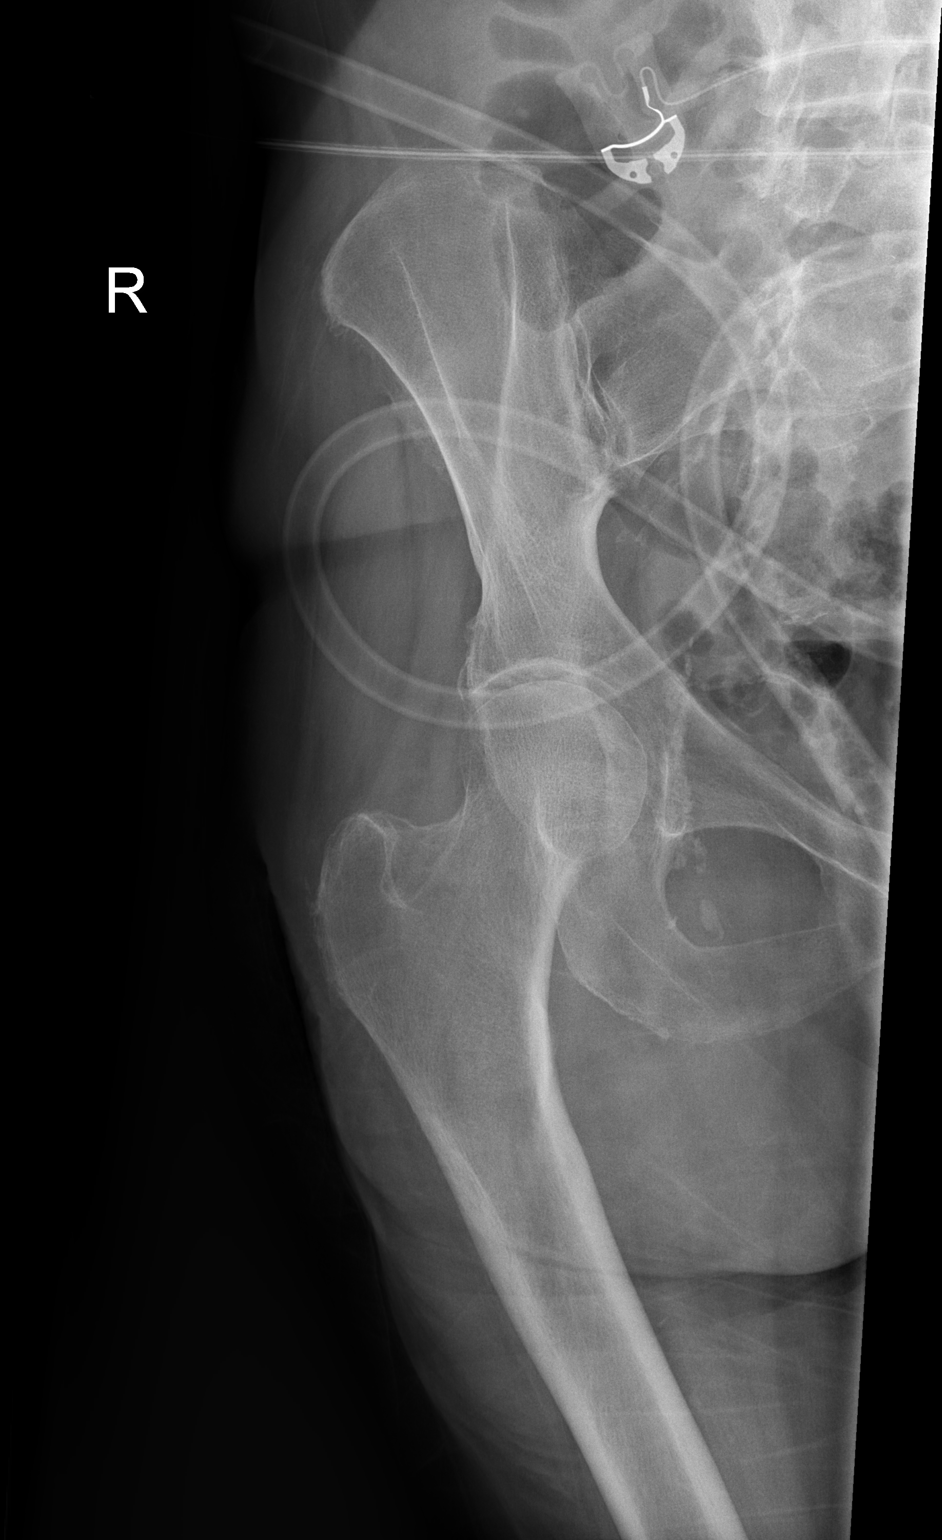

[t hip frog leg right]
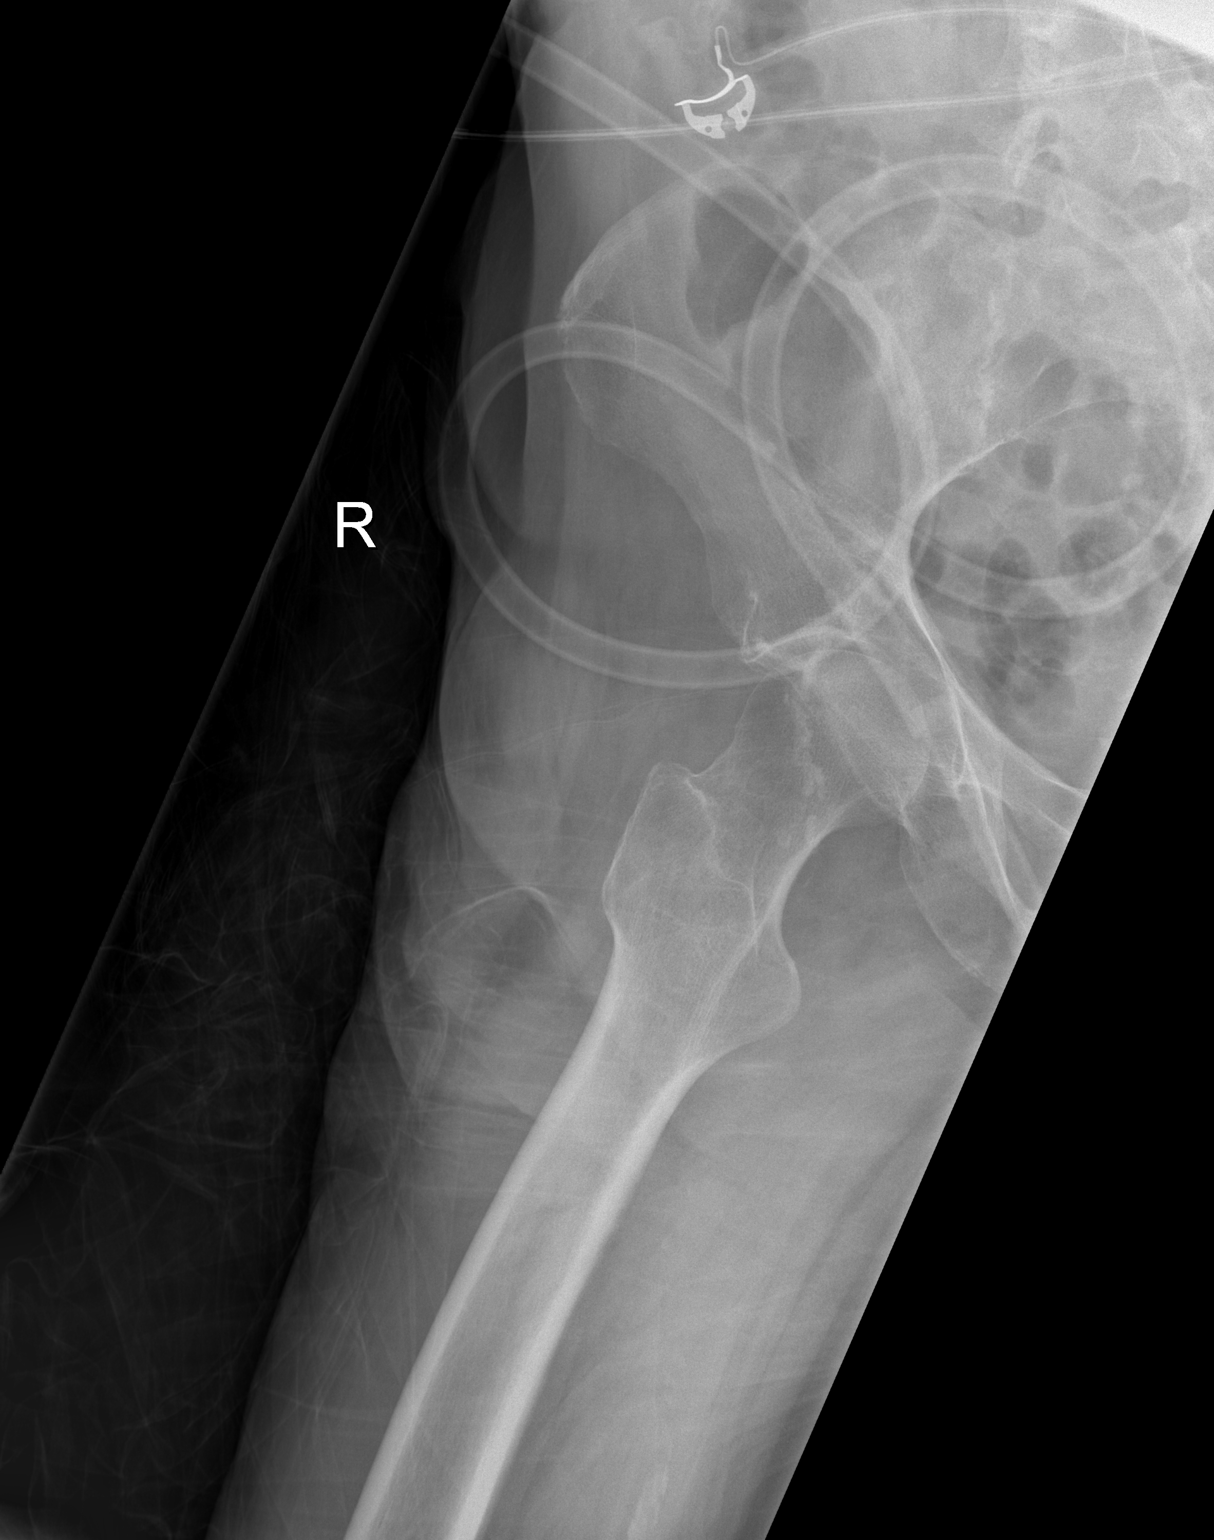

[2 of 2 positions shown; findings below may reference images not displayed]

FINDINGS: There is no evidence of hip fracture or dislocation. There is no
evidence of arthropathy or other focal bone abnormality.
IMPRESSION: Negative.

## 2021-07-12 IMAGING — CR DG CHEST 1V PORT
1 series · 1 of 1 positions shown · non-contrast
Comparison: 06/10/2008

CLINICAL DATA: Fall

EXAM:
PORTABLE CHEST 1 VIEW

[x chest ap]
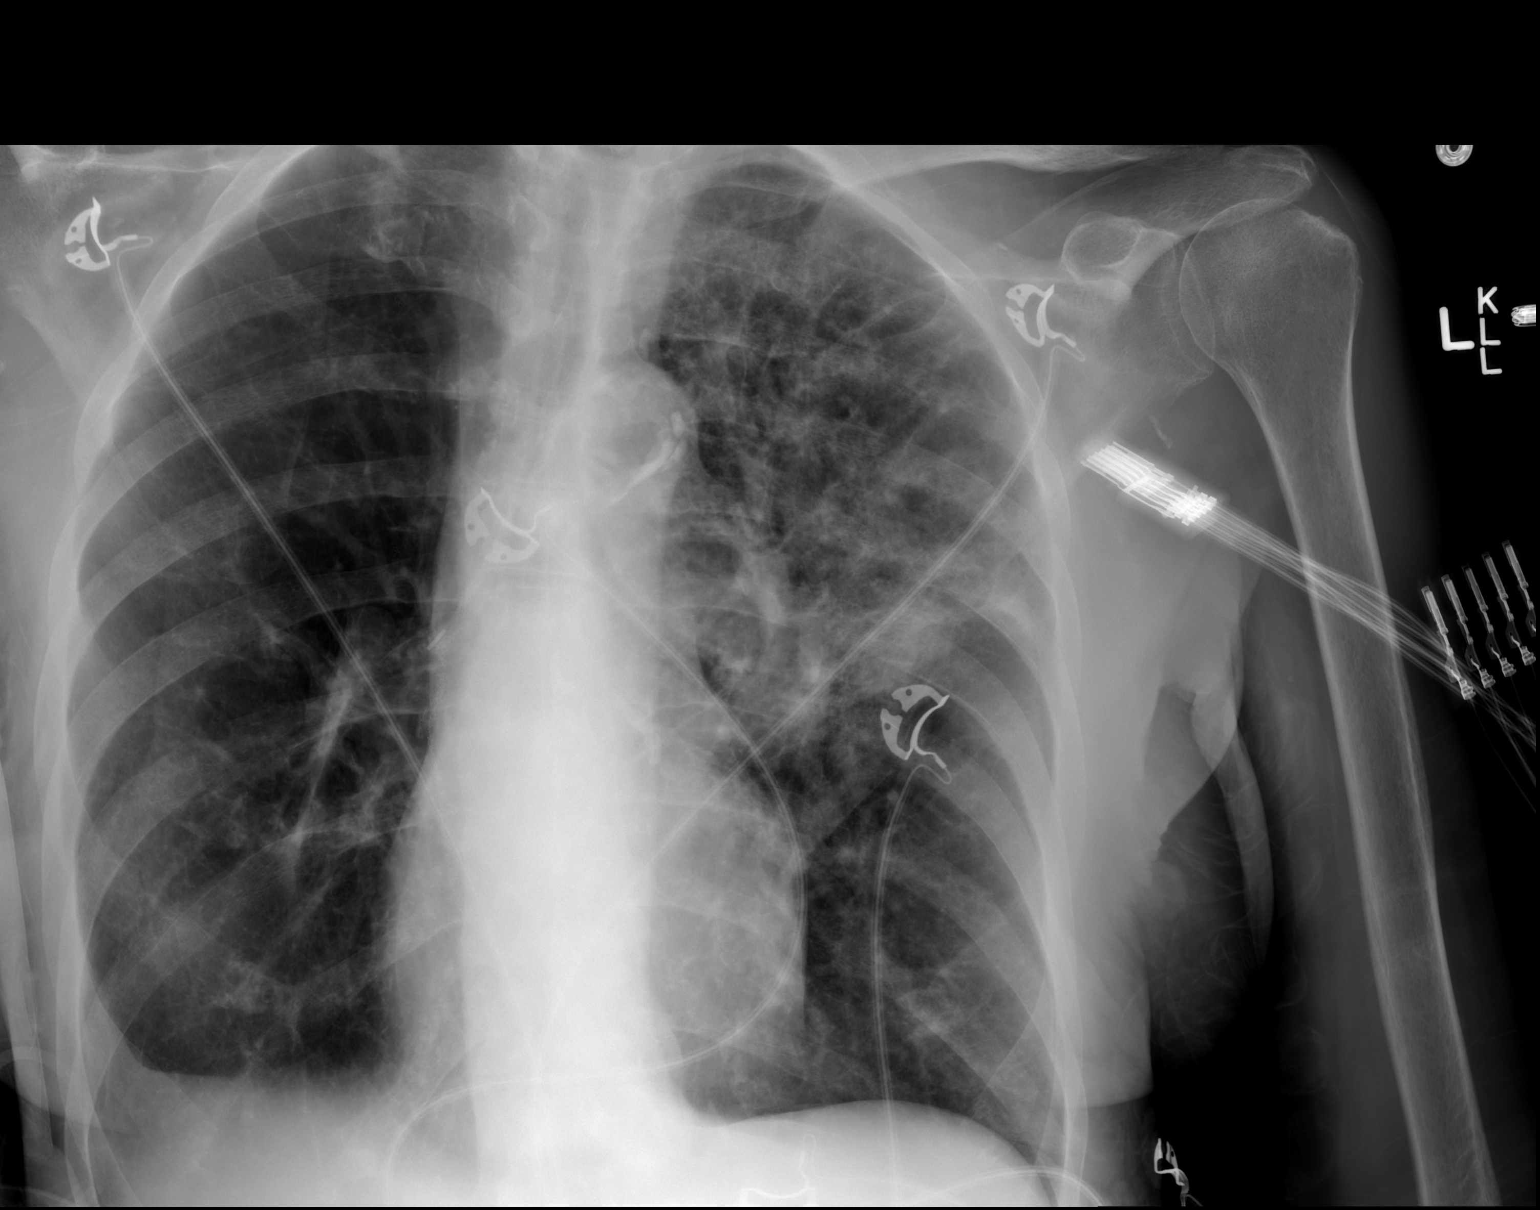

[1 of 1 positions shown; findings below may reference images not displayed]

FINDINGS: Extensive airspace disease left upper lobe consistent with
pneumonia.

Underlying emphysema. Blunting right costophrenic angle unchanged
compatible with effusion. Right lung otherwise clear

Heart size and vascularity normal. Atherosclerotic calcification
aortic arch.
IMPRESSION: Extensive infiltrate left upper lobe consistent with pneumonia.
Possible TB.

Small right pleural effusion.

## 2021-07-14 IMAGING — DX DG CHEST 1V PORT
2 series · 2 of 2 positions shown · non-contrast
Comparison: Multiple prior studies dating back to 1838, most recent
March 25, 2020

CLINICAL DATA: Chest pain and shortness of breath, diminished
breath sounds by report.

EXAM:
PORTABLE CHEST 1 VIEW

[chest ap (1 of 2)]
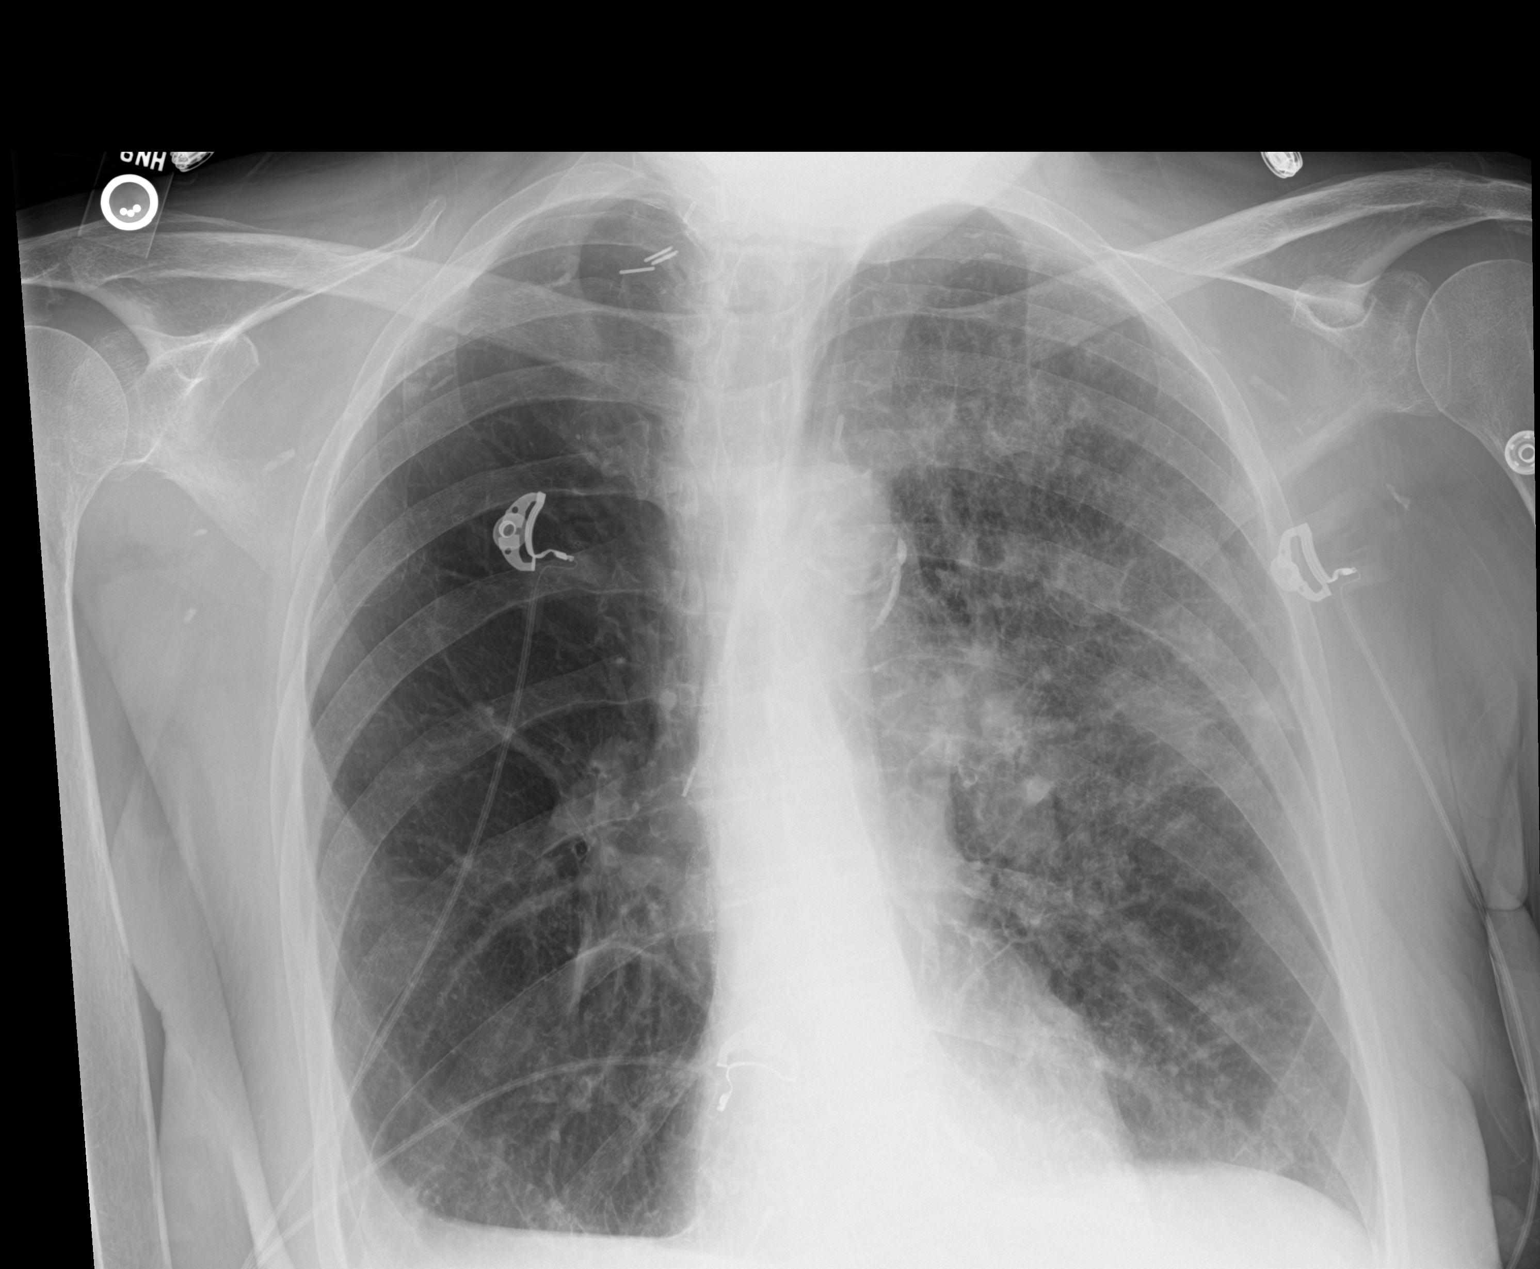

[chest ap (2 of 2)]
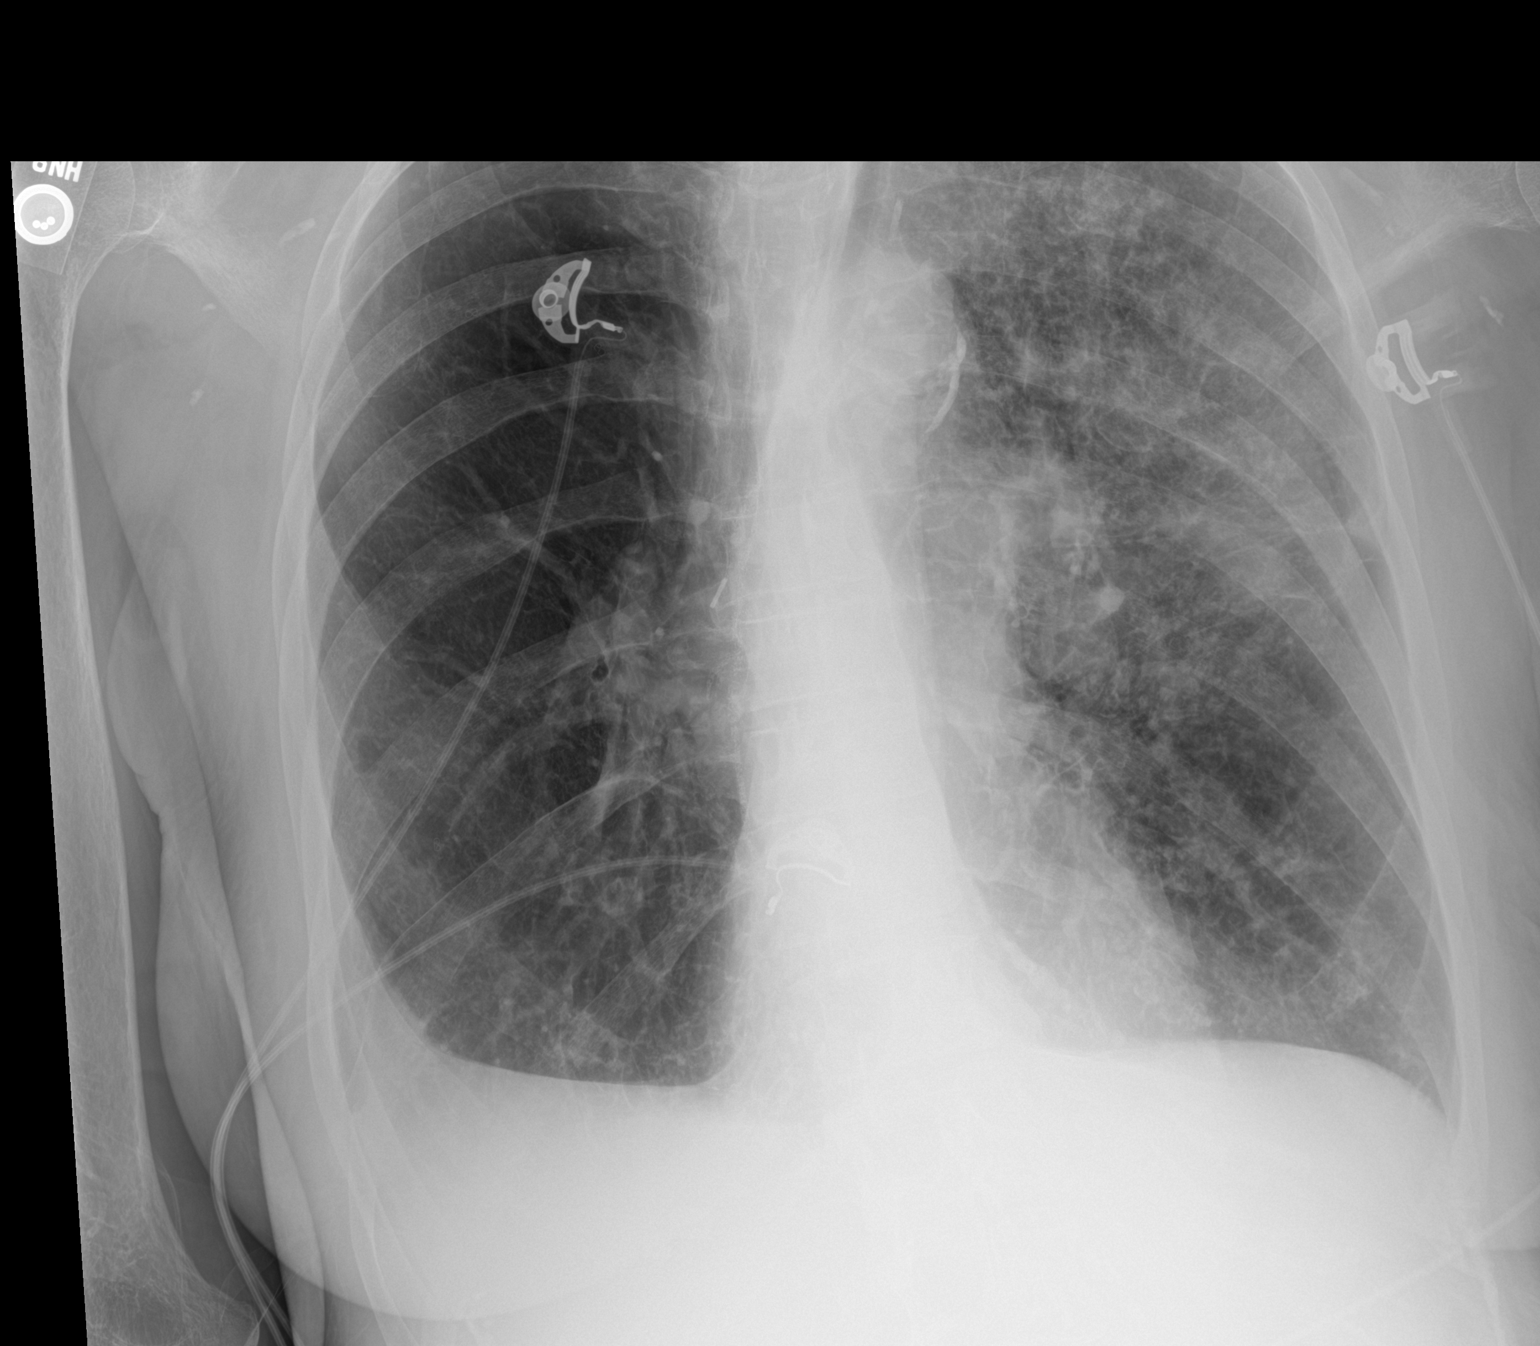

[2 of 2 positions shown; findings below may reference images not displayed]

FINDINGS: Accounting for differences in technique no interval change in the
appearance of interstitial and airspace disease in the LEFT upper
chest compared to the recent comparison study.

Signs of emphysema in the background as before.

Pleural and parenchymal scarring at the RIGHT lung base unchanged.

Cardiomediastinal contours and hilar structures are stable with
partially obscured LEFT hilar structures due to interstitial and
airspace opacities.

On limited assessment no acute skeletal process.
IMPRESSION: No interval change in the appearance of the chest compared to the
recent comparison study. Signs of emphysema with basilar scarring
RIGHT greater than LEFT. Findings may represent infection as
discussed previously. Note that this patient also has a history of
neoplasm. Lymphangitic carcinomatosis is another differential
consideration. Would also correlate with whether there has been
radiation therapy to this area that could also be a cause of
pneumonitis. CT of the chest may be helpful for further evaluation.
